# Patient Record
Sex: Male | Born: 1980
Health system: Southern US, Community
[De-identification: ages and names within clinical notes are randomized; demographics above are authoritative.]

## PROBLEM LIST (undated history)

## (undated) DIAGNOSIS — I219 Acute myocardial infarction, unspecified: Secondary | ICD-10-CM

## (undated) DIAGNOSIS — F1010 Alcohol abuse, uncomplicated: Secondary | ICD-10-CM

## (undated) DIAGNOSIS — I255 Ischemic cardiomyopathy: Secondary | ICD-10-CM

## (undated) DIAGNOSIS — Z720 Tobacco use: Secondary | ICD-10-CM

## (undated) DIAGNOSIS — I509 Heart failure, unspecified: Secondary | ICD-10-CM

## (undated) DIAGNOSIS — I5022 Chronic systolic (congestive) heart failure: Secondary | ICD-10-CM

## (undated) DIAGNOSIS — G4730 Sleep apnea, unspecified: Secondary | ICD-10-CM

## (undated) DIAGNOSIS — J45909 Unspecified asthma, uncomplicated: Secondary | ICD-10-CM

## (undated) DIAGNOSIS — I2510 Atherosclerotic heart disease of native coronary artery without angina pectoris: Secondary | ICD-10-CM

---

## 2007-02-09 ENCOUNTER — Emergency Department (HOSPITAL_COMMUNITY): Admission: EM | Admit: 2007-02-09 | Discharge: 2007-02-09 | Payer: Self-pay | Admitting: Emergency Medicine

## 2016-12-30 DIAGNOSIS — I509 Heart failure, unspecified: Secondary | ICD-10-CM

## 2016-12-30 HISTORY — DX: Heart failure, unspecified: I50.9

## 2017-01-17 ENCOUNTER — Ambulatory Visit (HOSPITAL_COMMUNITY)
Admission: EM | Admit: 2017-01-17 | Discharge: 2017-01-17 | Disposition: A | Discharge status: Home / Self Care | Payer: Self-pay | Attending: Family Medicine | Admitting: Family Medicine

## 2017-01-17 ENCOUNTER — Encounter (HOSPITAL_COMMUNITY): Payer: Self-pay

## 2017-01-17 DIAGNOSIS — J40 Bronchitis, not specified as acute or chronic: Secondary | ICD-10-CM

## 2017-01-17 DIAGNOSIS — R05 Cough: Secondary | ICD-10-CM

## 2017-01-17 DIAGNOSIS — R509 Fever, unspecified: Secondary | ICD-10-CM

## 2017-01-17 DIAGNOSIS — R059 Cough, unspecified: Secondary | ICD-10-CM

## 2017-01-17 MED ORDER — AZITHROMYCIN 250 MG PO TABS: 250.0000 mg | ORAL_TABLET | Freq: Every day | ORAL | 0 refills | Status: DC

## 2017-01-17 MED ORDER — BENZONATATE 100 MG PO CAPS: 200.0000 mg | ORAL_CAPSULE | Freq: Three times a day (TID) | ORAL | 0 refills | Status: DC | PRN

## 2017-01-17 MED ORDER — OSELTAMIVIR PHOSPHATE 75 MG PO CAPS: 75.0000 mg | ORAL_CAPSULE | Freq: Two times a day (BID) | ORAL | 0 refills | Status: DC

## 2017-01-17 NOTE — ED Triage Notes (Signed)
Cough for 3 days and said sometimes it can be productive. Chest tightness, Body feels weak, mild fever. Sharp intermittent pain in both ears. Took cold and flu medication today.

## 2017-01-17 NOTE — ED Provider Notes (Signed)
CSN: [id number]     Arrival date & time 01/17/17  1545 History   None    Chief Complaint  Patient presents with  . Cough   (Consider location/radiation/quality/duration/timing/severity/associated sxs/prior Treatment) Patient c/o cough and uri sx's for over 3 days.  He has developed a fever over last 24 hours. His room mate has the flu.  He is a smoker.   The history is provided by the patient.  Cough  Cough characteristics:  Productive Sputum characteristics:  Yellow Severity:  Moderate Onset quality:  Sudden Duration:  3 days Timing:  Intermittent Progression:  Worsening Chronicity:  New Smoker: yes   Context: upper respiratory infection and weather changes   Relieved by:  None tried Worsened by:  Nothing Associated symptoms: fever     History reviewed. No pertinent past medical history. History reviewed. No pertinent surgical history. No family history on file. Social History  Substance Use Topics  . Smoking status: Current Some Day Smoker    Packs/day: 0.00    Types: Cigarettes  . Smokeless tobacco: Never Used  . Alcohol use 6.0 oz/week    10 Standard drinks or equivalent per week    Review of Systems  Constitutional: Positive for fatigue and fever.  HENT: Positive for congestion.   Eyes: Negative.   Respiratory: Positive for cough.   Cardiovascular: Negative.   Gastrointestinal: Negative.   Endocrine: Negative.   Genitourinary: Negative.   Musculoskeletal: Negative.   Allergic/Immunologic: Negative.   Neurological: Negative.   Hematological: Negative.   Psychiatric/Behavioral: Negative.     Allergies  Patient has no known allergies.  Home Medications   Prior to Admission medications   Medication Sig Start Date End Date Taking? Authorizing Provider  azithromycin (ZITHROMAX) 250 MG tablet Take 1 tablet (250 mg total) by mouth daily. Take first 2 tablets together, then 1 every day until finished. 01/17/17   Deatra Canter, FNP  benzonatate (TESSALON)  100 MG capsule Take 2 capsules (200 mg total) by mouth 3 (three) times daily as needed for cough. 01/17/17   Deatra Canter, FNP  oseltamivir (TAMIFLU) 75 MG capsule Take 1 capsule (75 mg total) by mouth every 12 (twelve) hours. 01/17/17   Deatra Canter, FNP   Meds Ordered and Administered this Visit  Medications - No data to display  BP 154/90 (BP Location: Left Arm)   Pulse 74   Temp 98.1 F (36.7 C) (Oral)   Resp 20   SpO2 97%  No data found.   Physical Exam  Constitutional: He appears well-developed and well-nourished.  HENT:  Head: Normocephalic and atraumatic.  Right Ear: External ear normal.  Left Ear: External ear normal.  Mouth/Throat: Oropharynx is clear and moist.  Eyes: Conjunctivae and EOM are normal. Pupils are equal, round, and reactive to light.  Neck: Normal range of motion. Neck supple.  Cardiovascular: Normal rate, regular rhythm and normal heart sounds.   Pulmonary/Chest: Effort normal and breath sounds normal.  Abdominal: Soft. Bowel sounds are normal.  Nursing note and vitals reviewed.   Urgent Care Course     Procedures (including critical care time)  Labs Review Labs Reviewed - No data to display  Imaging Review No results found.   Visual Acuity Review  Right Eye Distance:   Left Eye Distance:   Bilateral Distance:    Right Eye Near:   Left Eye Near:    Bilateral Near:         MDM   1. Bronchitis 2. Influenza 3.  Cough  Zpak Tamiflu Tessalon Perles  Push po fluids, rest, tylenol and motrin otc prn as directed for fever, arthralgias, and myalgias.  Follow up prn if sx's continue or persist.    Deatra Canter, FNP 01/17/17 1810

## 2018-01-18 ENCOUNTER — Encounter (HOSPITAL_COMMUNITY): Payer: Self-pay | Admitting: Emergency Medicine

## 2018-01-18 ENCOUNTER — Inpatient Hospital Stay (HOSPITAL_COMMUNITY)
Admission: EM | Admit: 2018-01-18 | Discharge: 2018-01-22 | DRG: 246 | Disposition: A | Discharge status: Home / Self Care | Payer: Self-pay | Attending: Internal Medicine | Admitting: Internal Medicine

## 2018-01-18 ENCOUNTER — Emergency Department (HOSPITAL_COMMUNITY): Payer: Self-pay

## 2018-01-18 ENCOUNTER — Encounter (HOSPITAL_COMMUNITY)
Admission: EM | Disposition: A | Discharge status: Home / Self Care | Payer: Self-pay | Source: Home / Self Care | Attending: Internal Medicine

## 2018-01-18 ENCOUNTER — Inpatient Hospital Stay (HOSPITAL_COMMUNITY): Payer: Self-pay

## 2018-01-18 ENCOUNTER — Other Ambulatory Visit: Payer: Self-pay

## 2018-01-18 DIAGNOSIS — I5021 Acute systolic (congestive) heart failure: Secondary | ICD-10-CM

## 2018-01-18 DIAGNOSIS — I2129 ST elevation (STEMI) myocardial infarction involving other sites: Secondary | ICD-10-CM

## 2018-01-18 DIAGNOSIS — Z955 Presence of coronary angioplasty implant and graft: Secondary | ICD-10-CM

## 2018-01-18 DIAGNOSIS — I255 Ischemic cardiomyopathy: Secondary | ICD-10-CM

## 2018-01-18 DIAGNOSIS — F17210 Nicotine dependence, cigarettes, uncomplicated: Secondary | ICD-10-CM | POA: Diagnosis present

## 2018-01-18 DIAGNOSIS — E669 Obesity, unspecified: Secondary | ICD-10-CM | POA: Diagnosis present

## 2018-01-18 DIAGNOSIS — I2109 ST elevation (STEMI) myocardial infarction involving other coronary artery of anterior wall: Principal | ICD-10-CM | POA: Diagnosis present

## 2018-01-18 DIAGNOSIS — J189 Pneumonia, unspecified organism: Secondary | ICD-10-CM

## 2018-01-18 DIAGNOSIS — R9431 Abnormal electrocardiogram [ECG] [EKG]: Secondary | ICD-10-CM

## 2018-01-18 DIAGNOSIS — J45909 Unspecified asthma, uncomplicated: Secondary | ICD-10-CM | POA: Diagnosis present

## 2018-01-18 DIAGNOSIS — F1010 Alcohol abuse, uncomplicated: Secondary | ICD-10-CM | POA: Diagnosis present

## 2018-01-18 DIAGNOSIS — I509 Heart failure, unspecified: Secondary | ICD-10-CM

## 2018-01-18 DIAGNOSIS — I249 Acute ischemic heart disease, unspecified: Secondary | ICD-10-CM | POA: Diagnosis present

## 2018-01-18 DIAGNOSIS — I2510 Atherosclerotic heart disease of native coronary artery without angina pectoris: Secondary | ICD-10-CM | POA: Diagnosis present

## 2018-01-18 DIAGNOSIS — E876 Hypokalemia: Secondary | ICD-10-CM

## 2018-01-18 DIAGNOSIS — Z6836 Body mass index (BMI) 36.0-36.9, adult: Secondary | ICD-10-CM

## 2018-01-18 DIAGNOSIS — I219 Acute myocardial infarction, unspecified: Secondary | ICD-10-CM

## 2018-01-18 HISTORY — PX: LEFT HEART CATH AND CORONARY ANGIOGRAPHY: CATH118249

## 2018-01-18 HISTORY — DX: Unspecified asthma, uncomplicated: J45.909

## 2018-01-18 LAB — D-DIMER, QUANTITATIVE: D-Dimer, Quant: 2.48 ug/mL-FEU — ABNORMAL HIGH (ref 0.00–0.50)

## 2018-01-18 LAB — BRAIN NATRIURETIC PEPTIDE: B NATRIURETIC PEPTIDE 5: 909.5 pg/mL — AB (ref 0.0–100.0)

## 2018-01-18 LAB — ECHOCARDIOGRAM COMPLETE
CHL CUP STROKE VOLUME: 51 mL
E decel time: 127 msec
E/e' ratio: 9.55
FS: 17 % — AB (ref 28–44)
Height: 75.5 in
IVS/LV PW RATIO, ED: 1
LA ID, A-P, ES: 44 mm
LA diam index: 1.7 cm/m2
LAVOL: 85.4 mL
LAVOLA4C: 93.8 mL
LAVOLIN: 33 mL/m2
LDCA: 4.15 cm2
LEFT ATRIUM END SYS DIAM: 44 mm
LV E/e' medial: 9.55
LV E/e'average: 9.55
LV dias vol: 157 mL — AB (ref 62–150)
LV sys vol index: 41 mL/m2
LVDIAVOLIN: 61 mL/m2
LVELAT: 10.1 cm/s
LVOT SV: 68 mL
LVOT VTI: 16.5 cm
LVOT diameter: 23 mm
LVOTPV: 105 cm/s
LVSYSVOL: 106 mL — AB (ref 21–61)
MV Dec: 127
MV pk A vel: 42.7 m/s
MV pk E vel: 96.5 m/s
MVPG: 4 mmHg
PW: 12 mm — AB (ref 0.6–1.1)
RV LATERAL S' VELOCITY: 13.8 cm/s
RV TAPSE: 21.4 mm
Simpson's disk: 33
TDI e' lateral: 10.1
TDI e' medial: 5.66
Weight: 4676.8 oz

## 2018-01-18 LAB — CBC WITH DIFFERENTIAL/PLATELET
BASOS PCT: 0 %
Basophils Absolute: 0 10*3/uL (ref 0.0–0.1)
EOS PCT: 0 %
Eosinophils Absolute: 0 10*3/uL (ref 0.0–0.7)
HEMATOCRIT: 40.7 % (ref 39.0–52.0)
HEMOGLOBIN: 14.3 g/dL (ref 13.0–17.0)
LYMPHS PCT: 21 %
Lymphs Abs: 2.8 10*3/uL (ref 0.7–4.0)
MCH: 31.8 pg (ref 26.0–34.0)
MCHC: 35.1 g/dL (ref 30.0–36.0)
MCV: 90.4 fL (ref 78.0–100.0)
MONOS PCT: 11 %
Monocytes Absolute: 1.5 10*3/uL — ABNORMAL HIGH (ref 0.1–1.0)
NEUTROS PCT: 68 %
Neutro Abs: 9 10*3/uL — ABNORMAL HIGH (ref 1.7–7.7)
Platelets: 363 10*3/uL (ref 150–400)
RBC: 4.5 MIL/uL (ref 4.22–5.81)
RDW: 12.7 % (ref 11.5–15.5)
WBC: 13.3 10*3/uL — ABNORMAL HIGH (ref 4.0–10.5)

## 2018-01-18 LAB — BASIC METABOLIC PANEL
Anion gap: 12 (ref 5–15)
BUN: 10 mg/dL (ref 6–20)
CO2: 19 mmol/L — AB (ref 22–32)
CREATININE: 1.02 mg/dL (ref 0.61–1.24)
Calcium: 8.2 mg/dL — ABNORMAL LOW (ref 8.9–10.3)
Chloride: 100 mmol/L — ABNORMAL LOW (ref 101–111)
GFR calc Af Amer: >60 mL/min (ref >60)
GFR calc non Af Amer: >60 mL/min (ref >60)
GLUCOSE: 161 mg/dL — AB (ref 65–99)
Potassium: 3.1 mmol/L — ABNORMAL LOW (ref 3.5–5.1)
SODIUM: 131 mmol/L — AB (ref 135–145)

## 2018-01-18 LAB — PROTIME-INR
INR: 1.21
PROTHROMBIN TIME: 15.2 s (ref 11.4–15.2)

## 2018-01-18 LAB — TROPONIN I
Troponin I: 7.59 ng/mL (ref <0.03)
Troponin I: 7.49 ng/mL (ref <0.03)
Troponin I: 6.28 ng/mL (ref <0.03)
Troponin I: 5.52 ng/mL (ref <0.03)

## 2018-01-18 LAB — I-STAT TROPONIN, ED: TROPONIN I, POC: 7.84 ng/mL — AB (ref 0.00–0.08)

## 2018-01-18 LAB — HEPARIN LEVEL (UNFRACTIONATED): Heparin Unfractionated: <0.1 IU/mL — ABNORMAL LOW (ref 0.30–0.70)

## 2018-01-18 LAB — HEMOGLOBIN A1C
Hgb A1c MFr Bld: 5.5 % (ref 4.8–5.6)
Mean Plasma Glucose: 111.15 mg/dL

## 2018-01-18 LAB — LIPID PANEL
Cholesterol: 178 mg/dL (ref 0–200)
HDL: 20 mg/dL — ABNORMAL LOW (ref >40)
LDL Cholesterol: 140 mg/dL — ABNORMAL HIGH (ref 0–99)
Total CHOL/HDL Ratio: 8.9 RATIO
Triglycerides: 89 mg/dL (ref <150)
VLDL: 18 mg/dL (ref 0–40)

## 2018-01-18 LAB — APTT: aPTT: 112 seconds — ABNORMAL HIGH (ref 24–36)

## 2018-01-18 LAB — MAGNESIUM: MAGNESIUM: 2.2 mg/dL (ref 1.7–2.4)

## 2018-01-18 LAB — MRSA PCR SCREENING: MRSA by PCR: NEGATIVE

## 2018-01-18 LAB — HIV ANTIBODY (ROUTINE TESTING W REFLEX): HIV Screen 4th Generation wRfx: NONREACTIVE

## 2018-01-18 SURGERY — LEFT HEART CATH AND CORONARY ANGIOGRAPHY
Anesthesia: LOCAL

## 2018-01-18 MED ORDER — HEPARIN SODIUM (PORCINE) 1000 UNIT/ML IJ SOLN
INTRAMUSCULAR | Status: DC | PRN
  Administered 2018-01-18: 5000 [IU] via INTRAVENOUS

## 2018-01-18 MED ORDER — FUROSEMIDE 10 MG/ML IJ SOLN
20.0000 mg | Freq: Once | INTRAMUSCULAR | Status: AC
  Administered 2018-01-18: 20 mg via INTRAVENOUS
  Filled 2018-01-18: qty 2

## 2018-01-18 MED ORDER — LOSARTAN POTASSIUM 25 MG PO TABS
25.0000 mg | ORAL_TABLET | Freq: Every day | ORAL | Status: DC
  Administered 2018-01-18 – 2018-01-21 (×4): 25 mg via ORAL
  Filled 2018-01-18 (×4): qty 1

## 2018-01-18 MED ORDER — VERAPAMIL HCL 2.5 MG/ML IV SOLN
INTRAVENOUS | Status: AC
  Filled 2018-01-18: qty 2

## 2018-01-18 MED ORDER — SODIUM CHLORIDE 0.9 % IV SOLN: INTRAVENOUS | Status: DC

## 2018-01-18 MED ORDER — IOPAMIDOL (ISOVUE-370) INJECTION 76%
INTRAVENOUS | Status: AC
  Administered 2018-01-18: 100 mL
  Filled 2018-01-18: qty 100

## 2018-01-18 MED ORDER — HEPARIN (PORCINE) IN NACL 2-0.9 UNIT/ML-% IJ SOLN
INTRAMUSCULAR | Status: AC | PRN
  Administered 2018-01-18: 1000 mL

## 2018-01-18 MED ORDER — CARVEDILOL 3.125 MG PO TABS
3.1250 mg | ORAL_TABLET | Freq: Two times a day (BID) | ORAL | Status: DC
  Administered 2018-01-18 – 2018-01-22 (×7): 3.125 mg via ORAL
  Filled 2018-01-18 (×8): qty 1

## 2018-01-18 MED ORDER — ASPIRIN 81 MG PO CHEW
CHEWABLE_TABLET | ORAL | Status: AC
  Filled 2018-01-18: qty 1

## 2018-01-18 MED ORDER — FENTANYL CITRATE (PF) 100 MCG/2ML IJ SOLN
50.0000 ug | Freq: Once | INTRAMUSCULAR | Status: AC
  Administered 2018-01-18: 50 ug via INTRAVENOUS
  Filled 2018-01-18: qty 2

## 2018-01-18 MED ORDER — HEPARIN BOLUS VIA INFUSION
4000.0000 [IU] | Freq: Once | INTRAVENOUS | Status: DC
  Filled 2018-01-18: qty 4000

## 2018-01-18 MED ORDER — HEPARIN SODIUM (PORCINE) 1000 UNIT/ML IJ SOLN
INTRAMUSCULAR | Status: AC
  Filled 2018-01-18: qty 1

## 2018-01-18 MED ORDER — ASPIRIN 81 MG PO CHEW
324.0000 mg | CHEWABLE_TABLET | Freq: Once | ORAL | Status: AC
  Administered 2018-01-18: 324 mg via ORAL
  Filled 2018-01-18: qty 4

## 2018-01-18 MED ORDER — AZITHROMYCIN 250 MG PO TABS: 250.0000 mg | ORAL_TABLET | Freq: Every day | ORAL | Status: DC

## 2018-01-18 MED ORDER — FUROSEMIDE 10 MG/ML IJ SOLN
INTRAMUSCULAR | Status: DC | PRN
  Administered 2018-01-18: 40 mg via INTRAVENOUS

## 2018-01-18 MED ORDER — ASPIRIN 81 MG PO CHEW
CHEWABLE_TABLET | ORAL | Status: DC | PRN
  Administered 2018-01-18: 324 mg via ORAL

## 2018-01-18 MED ORDER — SODIUM CHLORIDE 0.9% FLUSH: 3.0000 mL | Freq: Two times a day (BID) | INTRAVENOUS | Status: DC

## 2018-01-18 MED ORDER — HEPARIN SODIUM (PORCINE) 5000 UNIT/ML IJ SOLN
4000.0000 [IU] | Freq: Once | INTRAMUSCULAR | Status: AC
  Administered 2018-01-18: 4000 [IU] via INTRAVENOUS

## 2018-01-18 MED ORDER — POTASSIUM CHLORIDE CRYS ER 20 MEQ PO TBCR
40.0000 meq | EXTENDED_RELEASE_TABLET | Freq: Every day | ORAL | Status: DC
Start: 2018-01-18 — End: 2018-01-22
  Administered 2018-01-18 – 2018-01-22 (×5): 40 meq via ORAL
  Filled 2018-01-18 (×5): qty 2

## 2018-01-18 MED ORDER — MORPHINE SULFATE (PF) 10 MG/ML IV SOLN: 2.0000 mg | INTRAVENOUS | Status: DC | PRN

## 2018-01-18 MED ORDER — METOPROLOL TARTRATE 12.5 MG HALF TABLET
12.5000 mg | ORAL_TABLET | Freq: Two times a day (BID) | ORAL | Status: DC
  Administered 2018-01-18: 12.5 mg via ORAL
  Filled 2018-01-18: qty 1

## 2018-01-18 MED ORDER — HEPARIN SODIUM (PORCINE) 5000 UNIT/ML IJ SOLN
INTRAMUSCULAR | Status: AC
  Filled 2018-01-18: qty 1

## 2018-01-18 MED ORDER — IPRATROPIUM-ALBUTEROL 0.5-2.5 (3) MG/3ML IN SOLN
3.0000 mL | Freq: Four times a day (QID) | RESPIRATORY_TRACT | Status: DC | PRN
  Administered 2018-01-18: 3 mL via RESPIRATORY_TRACT
  Filled 2018-01-18 (×2): qty 3

## 2018-01-18 MED ORDER — SODIUM CHLORIDE 0.9% FLUSH
3.0000 mL | Freq: Two times a day (BID) | INTRAVENOUS | Status: DC
  Administered 2018-01-18 – 2018-01-22 (×6): 3 mL via INTRAVENOUS

## 2018-01-18 MED ORDER — LIDOCAINE HCL (PF) 1 % IJ SOLN
INTRAMUSCULAR | Status: DC | PRN
  Administered 2018-01-18: 2 mL

## 2018-01-18 MED ORDER — SODIUM CHLORIDE 0.9 % IV SOLN: INTRAVENOUS | Status: AC

## 2018-01-18 MED ORDER — FUROSEMIDE 10 MG/ML IJ SOLN
INTRAMUSCULAR | Status: AC
  Filled 2018-01-18: qty 4

## 2018-01-18 MED ORDER — HEPARIN (PORCINE) IN NACL 100-0.45 UNIT/ML-% IJ SOLN
2000.0000 [IU]/h | INTRAMUSCULAR | Status: DC
  Administered 2018-01-18: 1500 [IU]/h via INTRAVENOUS
  Filled 2018-01-18 (×3): qty 250

## 2018-01-18 MED ORDER — MORPHINE SULFATE (PF) 4 MG/ML IV SOLN
2.0000 mg | INTRAVENOUS | Status: DC | PRN
  Administered 2018-01-18 – 2018-01-21 (×7): 2 mg via INTRAVENOUS
  Filled 2018-01-18 (×7): qty 1

## 2018-01-18 MED ORDER — SODIUM CHLORIDE 0.9% FLUSH: 3.0000 mL | INTRAVENOUS | Status: DC | PRN

## 2018-01-18 MED ORDER — LIDOCAINE HCL 1 % IJ SOLN
INTRAMUSCULAR | Status: AC
  Filled 2018-01-18: qty 20

## 2018-01-18 MED ORDER — IOPAMIDOL (ISOVUE-370) INJECTION 76%
INTRAVENOUS | Status: DC | PRN
  Administered 2018-01-18: 55 mL via INTRA_ARTERIAL

## 2018-01-18 MED ORDER — ATORVASTATIN CALCIUM 80 MG PO TABS
80.0000 mg | ORAL_TABLET | Freq: Every day | ORAL | Status: DC
  Administered 2018-01-18 – 2018-01-21 (×4): 80 mg via ORAL
  Filled 2018-01-18 (×4): qty 1

## 2018-01-18 MED ORDER — ACETAMINOPHEN 325 MG PO TABS
650.0000 mg | ORAL_TABLET | ORAL | Status: DC | PRN
  Administered 2018-01-19 – 2018-01-22 (×5): 650 mg via ORAL
  Filled 2018-01-18 (×5): qty 2

## 2018-01-18 MED ORDER — ASPIRIN EC 81 MG PO TBEC
81.0000 mg | DELAYED_RELEASE_TABLET | Freq: Every day | ORAL | Status: DC
  Administered 2018-01-19 – 2018-01-22 (×4): 81 mg via ORAL
  Filled 2018-01-18 (×4): qty 1

## 2018-01-18 MED ORDER — POTASSIUM CHLORIDE CRYS ER 20 MEQ PO TBCR
40.0000 meq | EXTENDED_RELEASE_TABLET | ORAL | Status: AC
  Administered 2018-01-18 (×2): 40 meq via ORAL
  Filled 2018-01-18 (×2): qty 2

## 2018-01-18 MED ORDER — NITROGLYCERIN 0.4 MG SL SUBL
0.4000 mg | SUBLINGUAL_TABLET | SUBLINGUAL | Status: DC | PRN
  Administered 2018-01-18: 0.4 mg via SUBLINGUAL
  Filled 2018-01-18: qty 1

## 2018-01-18 MED ORDER — SODIUM CHLORIDE 0.9 % IV SOLN: 250.0000 mL | INTRAVENOUS | Status: DC | PRN

## 2018-01-18 MED ORDER — ONDANSETRON HCL 4 MG/2ML IJ SOLN: 4.0000 mg | Freq: Four times a day (QID) | INTRAMUSCULAR | Status: DC | PRN

## 2018-01-18 MED ORDER — HEPARIN (PORCINE) IN NACL 2-0.9 UNIT/ML-% IJ SOLN
INTRAMUSCULAR | Status: AC
  Filled 2018-01-18: qty 1000

## 2018-01-18 MED ORDER — IOPAMIDOL (ISOVUE-370) INJECTION 76%
INTRAVENOUS | Status: AC
  Filled 2018-01-18: qty 125

## 2018-01-18 MED ORDER — VERAPAMIL HCL 2.5 MG/ML IV SOLN
INTRAVENOUS | Status: DC | PRN
  Administered 2018-01-18: 5 mL via INTRA_ARTERIAL

## 2018-01-18 MED ORDER — NITROGLYCERIN 1 MG/10 ML FOR IR/CATH LAB
INTRA_ARTERIAL | Status: AC
  Filled 2018-01-18: qty 10

## 2018-01-18 MED ORDER — FUROSEMIDE 10 MG/ML IJ SOLN
40.0000 mg | Freq: Two times a day (BID) | INTRAMUSCULAR | Status: DC
  Administered 2018-01-18: 40 mg via INTRAVENOUS
  Filled 2018-01-18: qty 4

## 2018-01-18 MED ORDER — SPIRONOLACTONE 12.5 MG HALF TABLET
12.5000 mg | ORAL_TABLET | Freq: Every day | ORAL | Status: DC
  Administered 2018-01-18: 12.5 mg via ORAL
  Filled 2018-01-18 (×3): qty 1

## 2018-01-18 MED ORDER — ACETAMINOPHEN 325 MG PO TABS: 650.0000 mg | ORAL_TABLET | ORAL | Status: DC | PRN

## 2018-01-18 MED ORDER — ASPIRIN 81 MG PO CHEW: 81.0000 mg | CHEWABLE_TABLET | Freq: Every day | ORAL | Status: DC

## 2018-01-18 MED ORDER — BENZONATATE 100 MG PO CAPS: 200.0000 mg | ORAL_CAPSULE | Freq: Three times a day (TID) | ORAL | Status: DC | PRN

## 2018-01-18 MED ORDER — SODIUM CHLORIDE 0.9 % IV BOLUS (SEPSIS)
1000.0000 mL | Freq: Once | INTRAVENOUS | Status: AC
  Administered 2018-01-18: 1000 mL via INTRAVENOUS

## 2018-01-18 MED ORDER — CLOPIDOGREL BISULFATE 75 MG PO TABS
75.0000 mg | ORAL_TABLET | Freq: Every day | ORAL | Status: DC
  Administered 2018-01-19 – 2018-01-22 (×4): 75 mg via ORAL
  Filled 2018-01-18 (×4): qty 1

## 2018-01-18 MED ORDER — ASPIRIN 81 MG PO CHEW
CHEWABLE_TABLET | ORAL | Status: AC
  Filled 2018-01-18: qty 4

## 2018-01-18 SURGICAL SUPPLY — 12 items
CATH INFINITI 5FR ANG PIGTAIL (CATHETERS) ×2 IMPLANT
CATH OPTITORQUE TIG 4.0 5F (CATHETERS) ×2 IMPLANT
DEVICE RAD COMP TR BAND LRG (VASCULAR PRODUCTS) ×2 IMPLANT
GLIDESHEATH SLEND A-KIT 6F 22G (SHEATH) ×2 IMPLANT
GUIDEWIRE INQWIRE 1.5J.035X260 (WIRE) ×1 IMPLANT
HOVERMATT SINGLE USE (MISCELLANEOUS) ×2 IMPLANT
INQWIRE 1.5J .035X260CM (WIRE) ×2
KIT HEART LEFT (KITS) ×2 IMPLANT
PACK CARDIAC CATHETERIZATION (CUSTOM PROCEDURE TRAY) ×2 IMPLANT
TRANSDUCER W/STOPCOCK (MISCELLANEOUS) ×2 IMPLANT
TUBING CIL FLEX 10 FLL-RA (TUBING) ×2 IMPLANT
WIRE HITORQ VERSACORE ST 145CM (WIRE) ×2 IMPLANT

## 2018-01-18 NOTE — Progress Notes (Signed)
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 Plan for [facility] tomorrow. Cath orders placed - keep NPO p MN.  Chrystie Nose, MD, [facility], FACP  [place]  [facility]  Medical Director of the Advanced Lipid [facility] &  Cardiovascular Risk Reduction Clinic Diplomate of the American Board of Clinical Lipidology Attending Cardiologist  Direct Dial: [phone]  Fax: [phone]  Website:  [email or web address]

## 2018-01-18 NOTE — Progress Notes (Signed)
Patient transferred to cath lab

## 2018-01-18 NOTE — Progress Notes (Signed)
  Echocardiogram 2D Echocardiogram has been performed.  Delcie Roch 01/18/2018, 9:56 AM

## 2018-01-18 NOTE — H&P (View-Only) (Signed)
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 Plan for [facility] tomorrow. Cath orders placed - keep NPO p MN.  Kenneth C. Hilty, MD, FACC, FACP    [facility]  Medical Director of the [facility] Lipid [facility]  Cardiovascular Risk Reduction Clinic Diplomate of the American Board of Clinical Lipidology Attending Cardiologist  Direct Dial: [phone]  Fax: [phone]  Website:  [email or web address]  

## 2018-01-18 NOTE — ED Notes (Signed)
ED Provider at bedside. 

## 2018-01-18 NOTE — Progress Notes (Signed)
ANTICOAGULATION CONSULT NOTE - Initial Consult  Pharmacy Consult for Heparin  Indication: chest pain/ACS  No Known Allergies  Patient Measurements: Height: 6' 3.5" (191.8 cm) Weight: 292 lb 4.8 oz (132.6 kg) IBW/kg (Calculated) : 85.65  Vital Signs: Temp: 98.5 F (36.9 C) (01/20 0013) Temp Source: Oral (01/20 0013) BP: 105/69 (01/20 0230) Pulse Rate: 102 (01/20 0230)  Labs: Recent Labs    01/18/18 0017  HGB 14.3  HCT 40.7  PLT 363  CREATININE 1.02    Estimated Creatinine Clearance: 148 mL/min (by C-G formula based on SCr of 1.02 mg/dL).   Medical History: Past Medical History:  Diagnosis Date  . Asthma     Assessment: Heparin for chest pain, has been sick for the past week or so, pain worse with inspiration, troponin is elevated at 7.84, plan for cath later today, starting heparin, CBC good, PTA meds reviewed.   Goal of Therapy:  Heparin level 0.3-0.7 units/ml Monitor platelets by anticoagulation protocol: Yes   Plan:  -Heparin 4000 units already given in ED -Start heparin at 1500 units/hr -1100 HL -Daily CBC/HL -Monitor for bleeding  Abran Duke 01/18/2018,3:11 AM

## 2018-01-18 NOTE — ED Notes (Signed)
Translucent Pads placed on pt. Monitoring on Zoll at this time.

## 2018-01-18 NOTE — Progress Notes (Addendum)
Upon assessment, pt states he has "5/10 left chest pain that hasn't really changed since I got here." VSS. Pt asymptomatic otherwise. Repeat EKG done, Cards paged with EKG changes since previous ekg earlier this am. Hilty, MD to bedside to assess. No further orders at this time. Will continue to monitor closely.  Delories Heinz, RN

## 2018-01-18 NOTE — Consult Note (Signed)
Advanced Heart Failure Team Consult Note   Referring Physician: Hilty   Reason for Consultation: Severe ischemic CM  HPI:    Sergio Hicks is seen today for evaluation of severe ischemic CM at the request of Dr. Rennis Golden.   He was in [place] until December when he developed a flu-like illness that was treated with oseltamivir. His symptoms improved and then worsened about a week ago, when he developed dyspnea and cough productive of scant bloody sputum. He went to an urgent care and was diagnosed with pneumonia, for which he was prescribed azithromycin. He felt better with treatment for a few days. Yesterday developed worsening SOB and CP sp came to ED.   In the ED, the patient was afebrile with HR 90s-100s, SBP 80s-120s. ED evaluation was notable for troponin elevation to 7.8, positive D-Dimer and mild leukocytosis. Due to concern for PE, he underwent CTA that showed no embolism or gross airspace disease.; notably this study did show LAD calcification. ECG was ordered and showed sinus tachycardia with poor R wave progression/possible anterior Q waves some degree of ST elevation in 1, AVL. Code STEMI was initially activated from the ED, which was subsequently cancelled on further discussion with the patient and interventional cardiologist on call.   Echo today shwed EF ~ 30% with WMA c/w anterior infarct. Underwent cath with Dr. Allyson Sabal which showed prox LAD occlusion with R to L collaterals LVEDP 31. Treated medically.   Denies CP. Mildly SOB.   Review of Systems: [y] = yes, [ ]  = no   General: Weight gain [ ] ; Weight loss [ ] ; Anorexia [ ] ; Fatigue [ y]; Fever [ ] ; Chills [ ] ; Weakness [ ]   Cardiac: Chest pain/pressure Cove.Etienne ]; Resting SOB Cove.Etienne ]; Exertional SOB [ y]; Orthopnea [ y]; Pedal Edema Cove.Etienne ]; Palpitations [ ] ; Syncope [ ] ; Presyncope [ ] ; Paroxysmal nocturnal dyspnea[ ]   Pulmonary: Cough [ y]; Wheezing[ ] ; Hemoptysis[ ] ; Sputum Cove.Etienne ]; Snoring [ ]   GI: Vomiting[ ] ; Dysphagia[ ] ; Melena[ ] ;  Hematochezia [ ] ; Heartburn[ ] ; Abdominal pain [ ] ; Constipation [ ] ; Diarrhea [ ] ; BRBPR [ ]   GU: Hematuria[ ] ; Dysuria [ ] ; Nocturia[ ]   Vascular: Pain in legs with walking [ ] ; Pain in feet with lying flat [ ] ; Non-healing sores [ ] ; Stroke [ ] ; TIA [ ] ; Slurred speech [ ] ;  Neuro: Headaches[ ] ; Vertigo[ ] ; Seizures[ ] ; Paresthesias[ ] ;Blurred vision [ ] ; Diplopia [ ] ; Vision changes [ ]   Ortho/Skin: Arthritis [ ] ; Joint pain [ ] ; Muscle pain [ ] ; Joint swelling [ ] ; Back Pain [ ] ; Rash [ ]   Psych: Depression[ ] ; Anxiety[ ]   Heme: Bleeding problems [ ] ; Clotting disorders [ ] ; Anemia [ ]   Endocrine: Diabetes [ ] ; Thyroid dysfunction[ ]   Home Medications Prior to Admission medications   Medication Sig Start Date End Date Taking? Authorizing Provider  albuterol (PROVENTIL HFA;VENTOLIN HFA) 108 (90 Base) MCG/ACT inhaler Inhale 1-2 puffs into the lungs every 6 (six) hours as needed for wheezing or shortness of breath.   Yes [provider]  Amoxicillin-Pot Clavulanate (AUGMENTIN PO) Take 1 tablet by mouth 2 (two) times daily.   Yes [provider]    Past Medical History: Past Medical History:  Diagnosis Date  . Asthma     Past Surgical History: History reviewed. No pertinent surgical history.  Family History: He is adopted so not available  Social History: Social History   Socioeconomic History  . Marital  status: Single    Spouse name: None  . Number of children: None  . Years of education: None  . Highest education level: None  Social Needs  . Financial resource strain: None  . Food insecurity - worry: None  . Food insecurity - inability: None  . Transportation needs - medical: None  . Transportation needs - non-medical: None  Occupational History  . Occupation: chef  Tobacco Use  . Smoking status: Current Some Day Smoker    Packs/day: 0.00    Types: Cigarettes  . Smokeless tobacco: Never Used  Substance and Sexual Activity  . Alcohol use: Yes     Alcohol/week: 6.0 oz    Types: 10 Standard drinks or equivalent per week  . Drug use: None  . Sexual activity: None  Other Topics Concern  . None  Social History Narrative  . None    Allergies:  No Known Allergies  Objective:    Vital Signs:   Temp:  [98.5 F (36.9 C)-98.8 F (37.1 C)] 98.8 F (37.1 C) (01/20 0308) Pulse Rate:  [0-109] 93 (01/20 1340) Resp:  [0-35] 25 (01/20 1340) BP: (87-120)/(47-85) 109/73 (01/20 1335) SpO2:  [0 %-100 %] 96 % (01/20 1340) Weight:  [131.5 kg (290 lb)-132.6 kg (292 lb 4.8 oz)] 132.6 kg (292 lb 4.8 oz) (01/20 0308) Last BM Date: 01/17/18  Weight change: Filed Weights   01/18/18 0016 01/18/18 0308  Weight: 131.5 kg (290 lb) 132.6 kg (292 lb 4.8 oz)    Intake/Output:   Intake/Output Summary (Last 24 hours) at 01/18/2018 1456 Last data filed at 01/18/2018 1400 Gross per 24 hour  Intake 1047.5 ml  Output 3425 ml  Net -2377.5 ml      Physical Exam    General:  Well appearing. No resp difficulty HEENT: normal Neck: supple. JVP . Carotids 2+ bilat; no bruits. No lymphadenopathy or thyromegaly appreciated. Cor: PMI nondisplaced. Tachy regular . No rubs, gallops or murmurs. Lungs: clear Abdomen: obese soft, nontender, nondistended. No hepatosplenomegaly. No bruits or masses. Good bowel sounds. Extremities: no cyanosis, clubbing, rash, trace edema Neuro: alert & orientedx3, cranial nerves grossly intact. moves all 4 extremities w/o difficulty. Affect pleasant   Telemetry   Sinus/sinus tach 90-100 Personally reviewed   EKG    Sinus with anterolateral Qs  Personally reviewed   Labs   Basic Metabolic Panel: Recent Labs  Lab 01/18/18 0017 01/18/18 0457  NA 131*  --   K 3.1*  --   CL 100*  --   CO2 19*  --   GLUCOSE 161*  --   BUN 10  --   CREATININE 1.02  --   CALCIUM 8.2*  --   MG  --  2.2    Liver Function Tests: No results for input(s): AST, ALT, ALKPHOS, BILITOT, PROT, ALBUMIN in the last 168 hours. No results  for input(s): LIPASE, AMYLASE in the last 168 hours. No results for input(s): AMMONIA in the last 168 hours.  CBC: Recent Labs  Lab 01/18/18 0017  WBC 13.3*  NEUTROABS 9.0*  HGB 14.3  HCT 40.7  MCV 90.4  PLT 363    Cardiac Enzymes: Recent Labs  Lab 01/18/18 0240 01/18/18 1017  TROPONINI 7.59* 7.49*    BNP: BNP (last 3 results) Recent Labs    01/18/18 0457  BNP 909.5*    ProBNP (last 3 results) No results for input(s): PROBNP in the last 8760 hours.   CBG: No results for input(s): GLUCAP in the last 168 hours.  Coagulation Studies: Recent Labs    01/18/18 0240  LABPROT 15.2  INR 1.21     Imaging   Dg Chest 2 View  Result Date: 01/18/2018 CLINICAL DATA:  37 year old male with chest pain and shortness of breath. EXAM: CHEST  2 VIEW COMPARISON:  None. FINDINGS: There is no focal consolidation, pleural effusion, or pneumothorax. Mild interstitial and peribronchial coarsening. Clinical correlation is recommended to evaluate for reactive small airway disease or viral infection. The cardiac silhouette is within normal limits. No acute osseous pathology. IMPRESSION: No focal consolidation Electronically Signed   By: Elgie Collard M.D.   On: 01/18/2018 00:35   Ct Angio Chest Pe W And/or Wo Contrast  Result Date: 01/18/2018 CLINICAL DATA:  Evaluate for pulmonary embolus. Shortness of breath with chest pain. EXAM: CT ANGIOGRAPHY CHEST WITH CONTRAST TECHNIQUE: Multidetector CT imaging of the chest was performed using the standard protocol during bolus administration of intravenous contrast. Multiplanar CT image reconstructions and MIPs were obtained to evaluate the vascular anatomy. CONTRAST:  ISOVUE-370 IOPAMIDOL (ISOVUE-370) INJECTION 76% COMPARISON:  None. FINDINGS: Cardiovascular: There is moderate cardiac enlargement. No pericardial effusion. Calcifications identified within the LAD coronary artery. The main pulmonary artery is patent. There is no saddle  embolus identified. Respiratory motion artifact is identified which diminishes lobar and segmental pulmonary artery detail. The pulmonary arterial opacification is also slightly suboptimal. Within this limitation no lobar or segmental pulmonary artery filling defects identified to suggest a clinically significant acute pulmonary embolus. Mediastinum/Nodes: The trachea appears patent and is midline. Normal appearance of the esophagus. No supraclavicular or axillary adenopathy. Right hilar node measures 1.7 cm, image 43 of series 5. No mediastinal adenopathy. Lungs/Pleura: There is bilateral and lower lobe predominant interlobular septal thickening. Diffuse ground-glass attenuation identified throughout both lungs. No lobar consolidation or atelectasis. Upper Abdomen: No acute abnormality. Musculoskeletal: No chest wall abnormality. No acute or significant osseous findings. Review of the MIP images confirms the above findings. IMPRESSION: 1. Diminished exam detail due to respiratory motion artifact and suboptimal pulmonary arterial opacification. Within this limitation there is no central obstructing pulmonary embolus and no lobar or segmental pulmonary emboli identified. 2. Cardiac enlargement.  Lad coronary artery calcifications noted. 3. Mild to moderate pulmonary edema. Electronically Signed   By: Signa Kell M.D.   On: 01/18/2018 02:08      Medications:     Current Medications: . [START ON 01/19/2018] aspirin EC  81 mg Oral Daily  . atorvastatin  80 mg Oral q1800  . [START ON 01/19/2018] clopidogrel  75 mg Oral Q breakfast  . metoprolol tartrate  12.5 mg Oral BID  . sodium chloride flush  3 mL Intravenous Q12H     Infusions: . sodium chloride 50 mL/hr at 01/18/18 1325  . sodium chloride         Patient Profile   37 y/o male admitted with OOH anterior MI and severe ICM. Found to have 100% LAD occlusion   Assessment/Plan   1. CAD with out of hospital anterior MI with late  presentation - cath 1/20 with total occlusion of LAD with R to L collaterals. Treated medcially. Pending viability study - ECHO EF 30%  - no further CP. Continue DAPT and statin.  - CR consult  2. Ischemic CM with acute systolic HF - Echo EF ~30%. LVEDP 31 on cath today - Start IV lasix - Switch lopressor to carvedilol. Start spiro and losartan -cMRI tomorrow to assess for viability in anterior wall (size permitting) -consider LifeVest  3. Hypokalemia - supp K  Length of Stay: 0  Arvilla Meres, MD  01/18/2018, 2:56 PM  Advanced Heart Failure Team Pager [phone] (M-F; 7a - 4p)  Please contact [facility] for night-coverage after hours (4p -7a ) and weekends on [email or web address]

## 2018-01-18 NOTE — ED Notes (Signed)
Activated CODE STEMI per Dr. Elesa Massed, RN notified of call

## 2018-01-18 NOTE — ED Triage Notes (Signed)
Patient reports mid/left lower chest pain with SOB onset last week with emesis and occasional dry cough , seen at an urgent care last Sunday diagnosed with pneumonia prescribed with oral antibiotic /steroid/antitussive with no improvement . Pain increases with deep inspirations .

## 2018-01-18 NOTE — ED Provider Notes (Signed)
TIME SEEN: 1:29 AM  CHIEF COMPLAINT: Chest pain and shortness of breath  HPI: Patient is a 37 year old male with history of asthma and tobacco use, obesity who was adopted and does not know his family history who presents to the emergency department with chest pain.  States pain started around 1:30 PM yesterday afternoon.  States that it is a sharp pain that is worse with deep inspiration in the left side of his chest.  Also has some diffuse anterior tightness that radiates into the left upper arm.  Has had some shortness of breath.  He reports he has been feeling sick on and off since December 7.  States he had a flulike illness in December.  States this did get better and then a week ago developed pneumonia.  States he was seen by urgent care and was diagnosed with put on Augmentin which he has not yet finished.  States he did have a low-grade fever of 100 and productive cough with yellow, green sputum sometimes mixed with blood.  States that hemoptysis has improved.  States that because he was feeling so bad he stayed mostly in bed for the past week.  States he started having new symptoms today and came to the emergency department.  No lower extremity swelling or pain.  No history of CAD, stress test or cardiac catheterization.  He does not have a cardiologist.  No known history of hypertension, diabetes or hyperlipidemia.  He is currently a smoker.  No known history of CHF.  ROS: See HPI Constitutional: no fever  Eyes: no drainage  ENT: no runny nose   Cardiovascular:  chest pain  Resp: SOB  GI: no vomiting GU: no dysuria Integumentary: no rash  Allergy: no hives  Musculoskeletal: no leg swelling  Neurological: no slurred speech ROS otherwise negative  PAST MEDICAL HISTORY/PAST SURGICAL HISTORY:  Past Medical History:  Diagnosis Date  . Asthma     MEDICATIONS:  Prior to Admission medications   Medication Sig Start Date End Date Taking? Authorizing Provider  azithromycin (ZITHROMAX)  250 MG tablet Take 1 tablet (250 mg total) by mouth daily. Take first 2 tablets together, then 1 every day until finished. 01/17/17   Deatra Canter, FNP  benzonatate (TESSALON) 100 MG capsule Take 2 capsules (200 mg total) by mouth 3 (three) times daily as needed for cough. 01/17/17   Deatra Canter, FNP  oseltamivir (TAMIFLU) 75 MG capsule Take 1 capsule (75 mg total) by mouth every 12 (twelve) hours. 01/17/17   Deatra Canter, FNP    ALLERGIES:  No Known Allergies  SOCIAL HISTORY:  Social History   Tobacco Use  . Smoking status: Current Some Day Smoker    Packs/day: 0.00    Types: Cigarettes  . Smokeless tobacco: Never Used  Substance Use Topics  . Alcohol use: Yes    Alcohol/week: 6.0 oz    Types: 10 Standard drinks or equivalent per week    FAMILY HISTORY: No family history on file.  EXAM: BP 120/80 (BP Location: Right Arm)   Pulse (!) 107   Temp 98.5 F (36.9 C) (Oral)   Resp 16   Ht 6' 3.5" (1.918 m)   Wt 131.5 kg (290 lb)   SpO2 99%   BMI 35.77 kg/m  CONSTITUTIONAL: Alert and oriented and responds appropriately to questions. Well-appearing; well-nourished, obese, in no significant distress HEAD: Normocephalic EYES: Conjunctivae clear, pupils appear equal, EOMI ENT: normal nose; moist mucous membranes NECK: Supple, no meningismus, no nuchal  rigidity, no LAD  CARD: RRR; S1 and S2 appreciated; no murmurs, no clicks, no rubs, no gallops RESP: Normal chest excursion without splinting or tachypnea; breath sounds clear and equal bilaterally; no wheezes, no rhonchi, no rales, no hypoxia or respiratory distress, speaking full sentences ABD/GI: Normal bowel sounds; non-distended; soft, non-tender, no rebound, no guarding, no peritoneal signs, no hepatosplenomegaly BACK:  The back appears normal and is non-tender to palpation, there is no CVA tenderness EXT: Normal ROM in all joints; non-tender to palpation; no edema; normal capillary refill; no cyanosis, no calf  tenderness or swelling; 2+ radial and DP pulses bilaterally SKIN: Normal color for age and race; warm; no rash NEURO: Moves all extremities equally PSYCH: The patient's mood and manner are appropriate. Grooming and personal hygiene are appropriate.  MEDICAL DECISION MAKING: Patient here with complaints of chest pain.  Pain is mostly pleuritic in nature with shortness of breath.  He does report having recent pneumonia and laying in bed for almost a week straight.  He was found to have a positive troponin in the waiting room.  EKG does show some concerning ST morphology in the lateral leads but does not meet STEMI criteria.  Will obtain repeat EKG and give aspirin.  I am concerned for PE.  Will send him urgently for CTA of his chest.  Patient and family updated.  Will also call cardiology.  ED PROGRESS: Repeat EKG seems a little bit more concerning.  Again still does not necessarily meet criteria for STEMI but does have Q waves.  Will discuss urgently with cardiology.   1:35 AM  D/w Dr. Allena Earing who will see in ED. he agrees EKG is concerning but will hold off on calling a code STEMI at this time.  Agrees with CTA of the chest.    Patient CTA shows no large pulmonary embolus.  He does have some cardiomegaly with pulmonary edema.  Calcifications noted in the coronary arteries.  Because no obvious PE, pneumonia visualized, code STEMI initiated.  Concerning for anteroseptal and lateral injury.  Patient received heparin bolus and aspirin.  Given 1 nitroglycerin which helped his pain significantly but then dropped his pressures.  Will give fentanyl now for pain.   2:20 AM  Code STEMI cancelled by cardiology attending Dr. Allyson Sabal.  They will still admit patient to the cardiac ICU and plan to perform catheterization in the morning.  Patient and family updated with this plan.  Cardiology aware that patient is still having some mild chest discomfort.  Cardiology to order heparin drip.   I reviewed all nursing  notes, vitals, pertinent previous records, EKGs, lab and urine results, imaging (as available).     EKG Interpretation  Date/Time:  Sunday January 18 2018 00:08:07 EST Ventricular Rate:  101 PR Interval:  132 QRS Duration: 90 QT Interval:  362 QTC Calculation: 469 R Axis:   138 Text Interpretation:  Sinus tachycardia Septal infarct , age undetermined Lateral infarct , age undetermined Abnormal ECG No old tracing to compare Confirmed by Ward, Baxter Hire [phone]) on 01/18/2018 1:13:54 AM        EKG Interpretation  Date/Time:  Sunday January 18 2018 01:24:10 EST Ventricular Rate:  105 PR Interval:  132 QRS Duration: 91 QT Interval:  361 QTC Calculation: 478 R Axis:   137 Text Interpretation:  Sinus tachycardia Probable inferior infarct, recent Probable anterolateral infarct, recent Confirmed by Rochele Raring [phone]) on 01/18/2018 2:51:20 AM       EKG Interpretation  Date/Time:  Sunday January 18 2018 01:37:02 EST Ventricular Rate:  103 PR Interval:  132 QRS Duration: 92 QT Interval:  371 QTC Calculation: 486 R Axis:   138 Text Interpretation:  Sinus tachycardia Lateral infarct, recent ST elevation, consider inferior injury Confirmed by Rochele Raring [phone]) on 01/18/2018 2:51:54 AM        EKG Interpretation  Date/Time:  Sunday January 18 2018 01:52:46 EST Ventricular Rate:  107 PR Interval:  132 QRS Duration: 94 QT Interval:  362 QTC Calculation: 483 R Axis:   135 Text Interpretation:  Sinus tachycardia Consider right atrial enlargement Lateral infarct,  recent Probable anteroseptal infarct, old Minimal ST elevation, inferior leads Confirmed by Rochele Raring [phone]) on 01/18/2018 2:52:38 AM       EKG Interpretation  Date/Time:  Sunday January 18 2018 02:12:46 EST Ventricular Rate:  107 PR Interval:  132 QRS Duration: 88 QT Interval:  346 QTC Calculation: 462 R Axis:   140 Text Interpretation:  Sinus tachycardia Lateral infarct, recent Probable anteroseptal  infarct, old Borderline ST elevation, inferior leads Confirmed by Rochele Raring [phone]) on 01/18/2018 2:53:15 AM          CRITICAL CARE Performed by: Baxter Hire Ward   Total critical care time: 65 minutes  Critical care time was exclusive of separately billable procedures and treating other patients.  Critical care was necessary to treat or prevent imminent or life-threatening deterioration.  Critical care was time spent personally by me on the following activities: development of treatment plan with patient and/or surrogate as well as nursing, discussions with consultants, evaluation of patient's response to treatment, examination of patient, obtaining history from patient or surrogate, ordering and performing treatments and interventions, ordering and review of laboratory studies, ordering and review of radiographic studies, pulse oximetry and re-evaluation of patient's condition.    Ward, Layla Maw, DO 01/18/18 [phone]

## 2018-01-18 NOTE — Interval H&P Note (Signed)
Cath Lab Visit (complete for each Cath Lab visit)  Clinical Evaluation Leading to the Procedure:   ACS: Yes.    Non-ACS:    Anginal Classification: CCS III  Anti-ischemic medical therapy: No Therapy  Non-Invasive Test Results: No non-invasive testing performed  Prior CABG: No previous CABG      History and Physical Interval Note:  01/18/2018 12:43 PM  Sergio Hicks  has presented today for surgery, with the diagnosis of NSTEMI  The various methods of treatment have been discussed with the patient and family. After consideration of risks, benefits and other options for treatment, the patient has consented to  Procedure(s): LEFT HEART CATH AND CORONARY ANGIOGRAPHY (N/A) as a surgical intervention .  The patient's history has been reviewed, patient examined, no change in status, stable for surgery.  I have reviewed the patient's chart and labs.  Questions were answered to the patient's satisfaction.     Nanetta Batty

## 2018-01-18 NOTE — Progress Notes (Addendum)
ANTICOAGULATION CONSULT NOTE   Pharmacy Consult for Heparin  Indication: chest pain/ACS  No Known Allergies  Patient Measurements: Height: 6' 3.5" (191.8 cm) Weight: 292 lb 4.8 oz (132.6 kg) IBW/kg (Calculated) : 85.65  Vital Signs: Temp: 98.8 F (37.1 C) (01/20 0308) Temp Source: Oral (01/20 0308) BP: 93/69 (01/20 1200) Pulse Rate: 101 (01/20 1217)  Labs: Recent Labs    01/18/18 0017 01/18/18 0240 01/18/18 1017  HGB 14.3  --   --   HCT 40.7  --   --   PLT 363  --   --   APTT  --  112*  --   LABPROT  --  15.2  --   INR  --  1.21  --   HEPARINUNFRC  --   --  <0.10*  CREATININE 1.02  --   --   TROPONINI  --  7.59* 7.49*    Estimated Creatinine Clearance: 148 mL/min (by C-G formula based on SCr of 1.02 mg/dL).   Medical History: Past Medical History:  Diagnosis Date  . Asthma     Assessment: Heparin for chest pain, has been sick for the past week or so, pain worse with inspiration, troponin is elevated at 7.84.  Plan for cath now.  Goal of Therapy:  Heparin level 0.3-0.7 units/ml Monitor platelets by anticoagulation protocol: Yes   Plan:  Follow up after cath  Sheppard Coil PharmD., BCPS Clinical Pharmacist 01/18/2018 12:42 PM

## 2018-01-18 NOTE — H&P (Signed)
Cardiology Admission History and Physical:   Patient ID: Sergio Hicks; MRN: [id number]; DOB: [birth year]   Admission date: 01/18/2018  Primary Care Provider: Patient, No Pcp Per Primary Cardiologist: None Primary Electrophysiologist:  None   Chief Complaint:  Dyspnea  Patient Profile:   Sergio Hicks is a 37 y.o. male with a history of asthma who presents with dyspnea with workup concerning for ACS.   History of Present Illness:   Sergio Hicks is a 37 y.o. male with a history of asthma who presents with dyspnea with workup concerning for ACS.   The patient reports that he was in his normal state of health until December when he developed a flu-like illness that was treated with oseltamivir. His symptoms improved and then worsened about a week ago, when he developed dyspnea and cough productive of scant bloody sputum. He went to an urgent care and was diagnosed with pneumonia, for which he was prescribed azithromycin. He felt better with treatment until yesterday, when he developed worsening shortness of breath and chest discomfort. He describes the chest pain to be a sharp and pressure sensation in his left lower chest that is worse with inspiration. Given his persistent symptoms, he presented to the ED.  In the ED, the patient was afebrile with HR 90s-100s, SBP 80s-120s. ED evaluation was notable for troponin elevation to 7.8, positive D-Dimer and mild leukocytosis. Due to concern for PE, he underwent CTA that showed no embolism or gross airspace disease.; notably this study did show LAD calcification. ECG was ordered and showed sinus tachycardia with poor R wave progression/possible anterior Q waves, large Q waves and some degree of ST elevation in 1, AVL. Code STEMI was initially activated from the ED, which was subsequently cancelled on further discussion with the patient and interventional cardiologist on call. His chest pain improved following a dose of IV fentanyl and NTG, and he was  admitted to the cardiac ICU. Repeat troponin level was 7.59.  Past Medical History:  Diagnosis Date  . Asthma     History reviewed. No pertinent surgical history.   Medications Prior to Admission: Prior to Admission medications   Medication Sig Start Date End Date Taking? Authorizing Provider  azithromycin (ZITHROMAX) 250 MG tablet Take 1 tablet (250 mg total) by mouth daily. Take first 2 tablets together, then 1 every day until finished. 01/17/17   Deatra Canter, FNP  benzonatate (TESSALON) 100 MG capsule Take 2 capsules (200 mg total) by mouth 3 (three) times daily as needed for cough. 01/17/17   Deatra Canter, FNP  oseltamivir (TAMIFLU) 75 MG capsule Take 1 capsule (75 mg total) by mouth every 12 (twelve) hours. 01/17/17   Deatra Canter, FNP     Allergies:   No Known Allergies  Social History:   Social History   Socioeconomic History  . Marital status: Single    Spouse name: Not on file  . Number of children: Not on file  . Years of education: Not on file  . Highest education level: Not on file  Social Needs  . Financial resource strain: Not on file  . Food insecurity - worry: Not on file  . Food insecurity - inability: Not on file  . Transportation needs - medical: Not on file  . Transportation needs - non-medical: Not on file  Occupational History  . Not on file  Tobacco Use  . Smoking status: Current Some Day Smoker    Packs/day: 0.00    Types: Cigarettes  .  Smokeless tobacco: Never Used  Substance and Sexual Activity  . Alcohol use: Yes    Alcohol/week: 6.0 oz    Types: 10 Standard drinks or equivalent per week  . Drug use: Not on file  . Sexual activity: Not on file  Other Topics Concern  . Not on file  Social History Narrative  . 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 All other ROS reviewed and negative.     Physical Exam/Data:   Vitals:   01/18/18 0230  01/18/18 0308 01/18/18 0315 01/18/18 0400  BP: 105/69  98/62 100/75  Pulse: (!) 102  (!) 101 98  Resp: 16  (!) 21 19  Temp:  98.8 F (37.1 C)    TempSrc:  Oral    SpO2: 97%  94% 95%  Weight:  132.6 kg (292 lb 4.8 oz)    Height:  6' 3.5" (1.918 m)      Intake/Output Summary (Last 24 hours) at 01/18/2018 0424 Last data filed at 01/18/2018 0400 Gross per 24 hour  Intake 1002.5 ml  Output -  Net 1002.5 ml   Filed Weights   01/18/18 0016 01/18/18 0308  Weight: 131.5 kg (290 lb) 132.6 kg (292 lb 4.8 oz)   Body mass index is 36.05 kg/m.  General:  Obese, NAD HEENT: normal Neck: no apparent JVD Cardiac:  normal S1, S2; RRR; no murmur Lungs:  Coarse breath sounds in L > R lungs Abd: soft, nontender Ext: no LE edema Musculoskeletal:  No deformities, BUE and BLE strength normal and equal Skin: warm and dry  Neuro:  No focal abnormalities noted Psych:  Normal affect    EKG:  The ECG that was done was personally reviewed and demonstrates sinus tachycardia with poor R wave progression/possible anterior Q waves, large Q waves and some degree of ST elevation in 1, AVL.   Relevant CV Studies: None   Laboratory Data:  Chemistry Recent Labs  Lab 01/18/18 0017  NA 131*  K 3.1*  CL 100*  CO2 19*  GLUCOSE 161*  BUN 10  CREATININE 1.02  CALCIUM 8.2*  GFRNONAA >60  GFRAA >60  ANIONGAP 12    No results for input(s): PROT, ALBUMIN, AST, ALT, ALKPHOS, BILITOT in the last 168 hours. Hematology Recent Labs  Lab 01/18/18 0017  WBC 13.3*  RBC 4.50  HGB 14.3  HCT 40.7  MCV 90.4  MCH 31.8  MCHC 35.1  RDW 12.7  PLT 363   Cardiac Enzymes Recent Labs  Lab 01/18/18 0240  TROPONINI 7.59*    Recent Labs  Lab 01/18/18 0037  TROPIPOC 7.84*    BNPNo results for input(s): BNP, PROBNP in the last 168 hours.  DDimer  Recent Labs  Lab 01/18/18 0115  DDIMER 2.48*    Radiology/Studies:  Dg Chest 2 View  Result Date: 01/18/2018 CLINICAL DATA:  37 year old male with  chest pain and shortness of breath. EXAM: CHEST  2 VIEW COMPARISON:  None. FINDINGS: There is no focal consolidation, pleural effusion, or pneumothorax. Mild interstitial and peribronchial coarsening. Clinical correlation is recommended to evaluate for reactive small airway disease or viral infection. The cardiac silhouette is within normal limits. No acute osseous pathology. IMPRESSION: No focal consolidation Electronically Signed   By: Elgie Collard M.D.   On: 01/18/2018 00:35   Ct Angio Chest Pe W And/or Wo Contrast  Result Date: 01/18/2018 CLINICAL DATA:  Evaluate for  pulmonary embolus. Shortness of breath with chest pain. EXAM: CT ANGIOGRAPHY CHEST WITH CONTRAST TECHNIQUE: Multidetector CT imaging of the chest was performed using the standard protocol during bolus administration of intravenous contrast. Multiplanar CT image reconstructions and MIPs were obtained to evaluate the vascular anatomy. CONTRAST:  ISOVUE-370 IOPAMIDOL (ISOVUE-370) INJECTION 76% COMPARISON:  None. FINDINGS: Cardiovascular: There is moderate cardiac enlargement. No pericardial effusion. Calcifications identified within the LAD coronary artery. The main pulmonary artery is patent. There is no saddle embolus identified. Respiratory motion artifact is identified which diminishes lobar and segmental pulmonary artery detail. The pulmonary arterial opacification is also slightly suboptimal. Within this limitation no lobar or segmental pulmonary artery filling defects identified to suggest a clinically significant acute pulmonary embolus. Mediastinum/Nodes: The trachea appears patent and is midline. Normal appearance of the esophagus. No supraclavicular or axillary adenopathy. Right hilar node measures 1.7 cm, image 43 of series 5. No mediastinal adenopathy. Lungs/Pleura: There is bilateral and lower lobe predominant interlobular septal thickening. Diffuse ground-glass attenuation identified throughout both lungs. No lobar  consolidation or atelectasis. Upper Abdomen: No acute abnormality. Musculoskeletal: No chest wall abnormality. No acute or significant osseous findings. Review of the MIP images confirms the above findings. IMPRESSION: 1. Diminished exam detail due to respiratory motion artifact and suboptimal pulmonary arterial opacification. Within this limitation there is no central obstructing pulmonary embolus and no lobar or segmental pulmonary emboli identified. 2. Cardiac enlargement.  Lad coronary artery calcifications noted. 3. Mild to moderate pulmonary edema. Electronically Signed   By: Signa Kell M.D.   On: 01/18/2018 02:08    Assessment and Plan:   Sergio Hicks is a 37 y.o. male with a history of asthma who presents with dyspnea with workup concerning for ACS.   Positive troponin Abnormal ECG Chest pain The patient has an unremarkable past medical history and presents with dyspnea and atypical chest pain with workup revealing significantly elevated troponin and abnormal ECG. His ECG findings (large lateral Q waves with abnormal T wave morphology, as well as poor R-wave progression) suggest prior infarct with chronicity that is challenging to determine at this time, particularly given his mild symptoms with elevated (but now stable) troponin levels. He is young and without known heart disease, although he does have risk factors for CAD (obesity, tobacco use), and the CT findings of coronary calcification further support this diagnosis. His presentation therefore is concerning for ACS. DDx also includes myocarditis, particularly given recent infection. At this time, will treat empirically for ACS with plan for ischemic evaluation and other basic workup. -Admitting to cardiac ICU -Empiric heparin infusion started -NPO for probable LHC. Pt urged to let us know if he develops new/worsening symptoms with low threshold for cath lab reactivation. -Echocardiogram, BNP ordered -S/p ASA 325. Starting 81 mg  daily -Starting atorvastatin 80 mg daily -Starting metoprolol 12.5 mg BID -Lipid panel ordered and atorvastatin therapy initiated  Recent Pneumonia Leukocytosis  As above, it is unclear to what degree, if any, his recent diagnosis of PNA may be contributing to his current presentation -Continuing course of azithromycin  Asthma Not currently treated -PRN duonebs ordered  Severity of Illness: The appropriate patient status for this patient is INPATIENT. Inpatient status is judged to be reasonable and necessary in order to provide the required intensity of service to ensure the patient's safety. The patient's presenting symptoms, physical exam findings, and initial radiographic and laboratory data in the context of their chronic comorbidities is felt to place them at high risk for further  clinical deterioration. Furthermore, it is not anticipated that the patient will be medically stable for discharge from the hospital within 2 midnights of admission. The following factors support the patient status of inpatient.   " The patient's presenting symptoms include chest pain, dyspnea. " The worrisome physical exam findings include coarse breath sounds. " The initial radiographic and laboratory data are worrisome because of positive troponin, abnormal ECG. " The chronic co-morbidities include obesity.   * I certify that at the point of admission it is my clinical judgment that the patient will require inpatient hospital care spanning beyond 2 midnights from the point of admission due to high intensity of service, high risk for further deterioration and high frequency of surveillance required.*    For questions or updates, please contact [facility] Please consult [email or web address] for contact info under Cardiology/STEMI.    Signed, Ernest Mallick, MD  01/18/2018 4:24 AM

## 2018-01-18 NOTE — Progress Notes (Signed)
Called to the bedside to evaluate patient with abnormal EKG changes. He appears comfortable and playing games on his phone. EKG unchanged from admission EKG which was reviewed by interventional cardiology and deemed NOT to be a STEMI. I agree. There are EKG changes, however, suggestive of a recent event or possibly related to cardiomyopathy. CT scan of the chest was negative for PE, however, there is ground glass and cardiomegaly. He had a recent pulmonary infection. Flu swab negative. Treated with augmentin, steroids and cough syrup - only got marginally better. Troponin was markedly elevated on arrival at 7.84 -> 7.59, BNP is 909. He is hyponatremic and there is a leukocytosis at 13.3 (could be steroids). LDL elevated at 140. LAD coronary artery calcium was noted.  I agree the differential is most suspicious for either missed LAD infarct or a myocarditis. Either way, he will need left and possibly right heart catheterization. There is heart failure on exam - basilar crackles and elevated JVP. BNP is elevated. Give lasix now. Will obtain early echo and further treatement based on those findings.  Chrystie Nose, MD, [facility], FACP  [place]  [facility]  Medical Director of the [facility]  Card[facility]ovascular [facility] Clinic Diplomate of the American Board of Clinical Lipidology Attending Cardiologist  Direct Dial: [phone]  Fax: [phone]  Website:  [email or web address]

## 2018-01-18 NOTE — Progress Notes (Signed)
Patient placed on [facility] as he desats to low 80's while he is sleeping.  Delories Heinz, RN

## 2018-01-18 NOTE — ED Notes (Signed)
Patient transported to CT 

## 2018-01-18 NOTE — ED Notes (Signed)
Canceled STEMi per [facility]

## 2018-01-19 ENCOUNTER — Inpatient Hospital Stay (HOSPITAL_COMMUNITY): Payer: Self-pay

## 2018-01-19 ENCOUNTER — Encounter (HOSPITAL_COMMUNITY): Payer: Self-pay | Admitting: Cardiovascular Disease

## 2018-01-19 DIAGNOSIS — I2102 ST elevation (STEMI) myocardial infarction involving left anterior descending coronary artery: Secondary | ICD-10-CM

## 2018-01-19 LAB — CBC
HCT: 43 % (ref 39.0–52.0)
Hemoglobin: 14.5 g/dL (ref 13.0–17.0)
MCH: 30.8 pg (ref 26.0–34.0)
MCHC: 33.7 g/dL (ref 30.0–36.0)
MCV: 91.3 fL (ref 78.0–100.0)
PLATELETS: 373 10*3/uL (ref 150–400)
RBC: 4.71 MIL/uL (ref 4.22–5.81)
RDW: 12.6 % (ref 11.5–15.5)
WBC: 11.1 10*3/uL — AB (ref 4.0–10.5)

## 2018-01-19 LAB — BASIC METABOLIC PANEL
ANION GAP: 13 (ref 5–15)
BUN: 9 mg/dL (ref 6–20)
CO2: 25 mmol/L (ref 22–32)
Calcium: 8.6 mg/dL — ABNORMAL LOW (ref 8.9–10.3)
Chloride: 102 mmol/L (ref 101–111)
Creatinine, Ser: 0.96 mg/dL (ref 0.61–1.24)
Glucose, Bld: 135 mg/dL — ABNORMAL HIGH (ref 65–99)
POTASSIUM: 3.4 mmol/L — AB (ref 3.5–5.1)
SODIUM: 140 mmol/L (ref 135–145)

## 2018-01-19 MED ORDER — ENOXAPARIN SODIUM 60 MG/0.6ML ~~LOC~~ SOLN
60.0000 mg | SUBCUTANEOUS | Status: DC
  Administered 2018-01-19: 60 mg via SUBCUTANEOUS
  Filled 2018-01-19 (×3): qty 0.6

## 2018-01-19 MED ORDER — SPIRONOLACTONE 25 MG PO TABS
25.0000 mg | ORAL_TABLET | Freq: Every day | ORAL | Status: DC
  Administered 2018-01-19 – 2018-01-22 (×4): 25 mg via ORAL
  Filled 2018-01-19 (×4): qty 1

## 2018-01-19 NOTE — Progress Notes (Signed)
Advanced Heart Failure Rounding Note  PCP:  Primary Cardiologist:   Subjective:     Yesterday diuresed with IV lasix. Started on spiro, losartan, and carvedilol. Negative 2.9 liters. SBP low 100s  Prior to admit he had stopped drinking for a few weeks. Drinks 5-6 beers + 2-3 shots on most days.   Denies SOB, CP, orthopnea or PND     Objective:   Weight Range: 288 lb 5.8 oz (130.8 kg) Body mass index is 35.57 kg/m.   Vital Signs:   Temp:  [98.3 F (36.8 C)-98.9 F (37.2 C)] 98.8 F (37.1 C) (01/21 0754) Pulse Rate:  [0-109] 91 (01/21 0600) Resp:  [0-35] 21 (01/21 0600) BP: (84-130)/(60-93) 102/68 (01/21 0600) SpO2:  [0 %-100 %] 95 % (01/21 0600) Weight:  [288 lb 5.8 oz (130.8 kg)] 288 lb 5.8 oz (130.8 kg) (01/21 0600) Last BM Date: 01/17/18  Weight change: Filed Weights   01/18/18 0016 01/18/18 0308 01/19/18 0600  Weight: 290 lb (131.5 kg) 292 lb 4.8 oz (132.6 kg) 288 lb 5.8 oz (130.8 kg)    Intake/Output:   Intake/Output Summary (Last 24 hours) at 01/19/2018 0803 Last data filed at 01/18/2018 1703 Gross per 24 hour  Intake 319.17 ml  Output 3275 ml  Net -2955.83 ml      Physical Exam    General:  Well appearing. No resp difficulty HEENT: Normal Neck: Supple. JVP 9-10  . Carotids 2+ bilat; no bruits. No lymphadenopathy or thyromegaly appreciated. Cor: PMI nondisplaced. Regular rate & rhythm. No rubs, gallops or murmurs. Lungs: Clear Abdomen: obese, soft , nontender, nondistended. No hepatosplenomegaly. No bruits or masses. Good bowel sounds. Extremities: No cyanosis, clubbing, rash, edema Neuro: Alert & orientedx3, cranial nerves grossly intact. moves all 4 extremities w/o difficulty. Affect pleasant   Telemetry   NSR 90s personally reviewed.   EKG    N/A   Labs    CBC Recent Labs    01/18/18 0017 01/19/18 0314  WBC 13.3* 11.1*  NEUTROABS 9.0*  --   HGB 14.3 14.5  HCT 40.7 43.0  MCV 90.4 91.3  PLT 363 373   Basic Metabolic  Panel Recent Labs    01/18/18 0017 01/18/18 0457 01/19/18 0314  NA 131*  --  140  K 3.1*  --  3.4*  CL 100*  --  102  CO2 19*  --  25  GLUCOSE 161*  --  135*  BUN 10  --  9  CREATININE 1.02  --  0.96  CALCIUM 8.2*  --  8.6*  MG  --  2.2  --    Liver Function Tests No results for input(s): AST, ALT, ALKPHOS, BILITOT, PROT, ALBUMIN in the last 72 hours. No results for input(s): LIPASE, AMYLASE in the last 72 hours. Cardiac Enzymes Recent Labs    01/18/18 1017 01/18/18 1337 01/18/18 2055  TROPONINI 7.49* 6.28* 5.52*    BNP: BNP (last 3 results) Recent Labs    01/18/18 0457  BNP 909.5*    ProBNP (last 3 results) No results for input(s): PROBNP in the last 8760 hours.   D-Dimer Recent Labs    01/18/18 0115  DDIMER 2.48*   Hemoglobin A1C Recent Labs    01/18/18 0457  HGBA1C 5.5   Fasting Lipid Panel Recent Labs    01/18/18 0240  CHOL 178  HDL 20*  LDLCALC 140*  TRIG 89  CHOLHDL 8.9   Thyroid Function Tests No results for input(s): TSH, T4TOTAL, T3FREE, THYROIDAB in the  last 72 hours.  Invalid input(s): FREET3  Other results:   Imaging     No results found.   Medications:     Scheduled Medications: . aspirin EC  81 mg Oral Daily  . atorvastatin  80 mg Oral q1800  . carvedilol  3.125 mg Oral BID WC  . clopidogrel  75 mg Oral Q breakfast  . furosemide  40 mg Intravenous BID  . losartan  25 mg Oral QHS  . potassium chloride  40 mEq Oral Daily  . sodium chloride flush  3 mL Intravenous Q12H  . spironolactone  12.5 mg Oral Daily     Infusions: . sodium chloride       PRN Medications:  sodium chloride, acetaminophen, benzonatate, ipratropium-albuterol, morphine injection, nitroGLYCERIN, ondansetron (ZOFRAN) IV, sodium chloride flush    Patient Profile   37 y/o male admitted with OOH anterior MI and severe ICM. Found to have 100% LAD occlusion     Assessment/Plan  1. CAD with out of hospital anterior MI with late  presentation - cath 1/20 with total occlusion of LAD with R to L collaterals. Treated medcially. Pending viability study - ECHO EF 30%  - Continue DAPT and statin.  - CR consult  2. Ischemic CM with acute systolic HF - Echo EF ~30%. LVEDP 31 on cath - Increase spiro 25 mg daily - Continue carvedilol 3.125 mg twice a day  - Hold lasix until after MRI.  - Continue losartan -cMRI today to assess viability in anterior wall (size permitting) -consider LifeVest  3. Hypokalemia - Supp K   4. ETOH-  Discussed alcohol cessation. Drinks 6-7 beers almost daily.    Medication concerns reviewed with patient and pharmacy team. Barriers identified: No insurance. Will need HF fund.  Length of Stay: 1  Amy Clegg, NP  01/19/2018, 8:03 AM Advanced Heart Failure Team Pager [phone] (M-F; 7a - 4p)  Please contact [facility] for night-coverage after hours (4p -7a ) and weekends on [email or web address]  Patient seen and examined with Tonye Becket, NP. We discussed all aspects of the encounter. I agree with the assessment and plan as stated above.   Volume status improved. No further CP. No orthopnea or PND. Results of cath and echo reviewed with him personally. Will attempt to get cMRI today (size permitting) to assess for anterior wall viability. If significant viability will consider PCI LAD. Continue to titrate HF meds. HF Navigator to see. LifeVest consult.  Can go to tele. Will need CR.   Arvilla Meres, MD  8:56 AM

## 2018-01-19 NOTE — Care Management Note (Signed)
Case Management Note Donn Pierini RN, BSN Unit 4E-Case Manager [phone]  Patient Details  Name: Sergio Hicks MRN: [id number] Date of Birth: 1981-02-25  Subjective/Objective:  Pt admitted ACS, with out of hospital MI with late presentation- s/p cath-  Ischemic CM with acute systolic HF               Action/Plan: PTA pt lived at home- referral for life Vest needs- per chart pt will be assisted with medications through HF fund. Life Vest order form has been filled out and paperwork given along with order form to [employer] rep-Jim who will f/u with approval- pt will need LOG for lifevest- CM will continue to follow for transition of care needs.   Expected Discharge Date:  01/19/18               Expected Discharge Plan:  Home/Self Care  In-House Referral:     Discharge planning Services  CM Consult, Medication Assistance  Post Acute Care Choice:  Durable Medical Equipment Choice offered to:     DME Arranged:  Life vest DME Agency:  [employer]  HH Arranged:    HH Agency:     Status of Service:  In process, will continue to follow  If discussed at Long Length of Stay Meetings, dates discussed:    Discharge Disposition:   Additional Comments:  Darrold Span, RN 01/19/2018, 2:08 PM

## 2018-01-19 NOTE — Progress Notes (Signed)
Heart Failure Navigator Consult Note  Presentation: Per Dr Gala Romney: Sergio Hicks is seen today for evaluation of severe ischemic CM at the request of Dr. Rennis Golden.   He was in [place] until December when he developed a flu-like illness that was treated with oseltamivir. His symptoms improved and then worsened about a week ago, when he developed dyspnea and cough productive of scant bloody sputum. He went to an urgent care and was diagnosed with pneumonia, for which he was prescribed azithromycin. He felt better with treatment for a few days. Yesterday developed worsening SOB and CP sp came to ED.   In the ED, the patient was afebrile with HR 90s-100s, SBP 80s-120s. ED evaluation was notable for troponin elevation to 7.8, positive D-Dimer and mild leukocytosis. Due to concern for PE, he underwent CTA that showed no embolism or gross airspace disease.; notably this study did show LAD calcification. ECG was ordered and showed sinus tachycardia with poor R wave progression/possible anterior Q waves some degree of ST elevation in 1, AVL. Code STEMI was initially activated from the ED, which was subsequently cancelled on further discussion with the patient and interventional cardiologist on call.   Echo today shwed EF ~ 30% with WMA c/w anterior infarct. Underwent cath with Dr. Allyson Sabal which showed prox LAD occlusion with R to L collaterals LVEDP 31. Treated medically.   Past Medical History:  Diagnosis Date  . Asthma     Social History   Socioeconomic History  . Marital status: Single    Spouse name: None  . Number of children: None  . Years of education: None  . Highest education level: None  Social Needs  . Financial resource strain: None  . Food insecurity - worry: None  . Food insecurity - inability: None  . Transportation needs - medical: None  . Transportation needs - non-medical: None  Occupational History  . Occupation: chef  Tobacco Use  . Smoking status: Current Some Day Smoker   Packs/day: 0.00    Types: Cigarettes  . Smokeless tobacco: Never Used  Substance and Sexual Activity  . Alcohol use: Yes    Alcohol/week: 6.0 oz    Types: 10 Standard drinks or equivalent per week  . Drug use: None  . Sexual activity: None  Other Topics Concern  . None  Social History Narrative  . None    ECHO: 01/18/18: Study Conclusions  - Left ventricle: The cavity size was mildly dilated. Wall   thickness was increased in a pattern of mild LVH. Systolic   function was moderately to severely reduced. The estimated   ejection fraction was in the range of 30% to 35%. Anterior,   anteroseptal, apical and inferoapical akinesis - suggestive of   LAD territory ischemia/infarct. Doppler parameters are consistent   with abnormal left ventricular relaxation (grade 1 diastolic   dysfunction). The E/e&' ratio is >15, suggesting elevated LV   filling pressure. - Mitral valve: Mildly thickened leaflets . There was trivial   regurgitation. - Left atrium: The atrium was mildly dilated. - Systemic veins: The IVC measures <2.1 cm, but does not collapse   >50%, suggestive of an elevated RA pressure of 8 mmHg.  Impressions:  - LVEF 30-35% with large LAD territory WMA concerning for   ischemia/infarct, grade 1 DD with elevated LV filling pressure,   trivial MR, mild LAE, elevated RA pressure.  BNP    Component Value Date/Time   BNP 909.5 (H) 01/18/2018 0457    ProBNP No results found  for: PROBNP   Education Assessment and Provision:  Detailed education and instructions provided on heart failure disease management including the following:  Signs and symptoms of Heart Failure When to call the physician Importance of daily weights Low sodium diet Fluid restriction Medication management Anticipated future follow-up appointments  Patient education given on each of the above topics.  Patient acknowledges understanding and acceptance of all instructions.  I spoke with [name] and his mother regarding his current hospitalization and new HF diagnosis.  He tells me that he is "going to have to make some lifestyle changes".  He works as a Investment banker, operational at [employer].  I reviewed the importance of daily weights and when to contact the physician.  He does not have a scale yet his mother says they will get one.  I discussed a low sodium diet and fluid restrictions.  He acknowledges that a fluid restriction will be very difficult for him.  He says he drinks "a lot" of fluid everyday. I reviewed high sodium foods to avoid.  He admits that he will need assistance with getting medications at discharge and currently has no insurance.  I will send discharge medications to the [facility] to be filled through the HF Fund.  He will follow in the [facility].  He would benefit from HF [employer] and I will make referral to that program as well.  Education Materials:  "Living Better With Heart Failure" Booklet, Daily Weight Tracker Tool    High Risk Criteria for Readmission and/or Poor Patient Outcomes:  (Recommend Follow-up with Advanced Heart Failure Clinic)-- Yes    EF <30%- 30-35% with grade 1 dias dys  2 or more admissions in 6 months- New HF --No  Difficult social situation- No insurance noted  Demonstrates medication noncompliance- denies   Barriers of Care:  New HF, Knowledge and compliance  Discharge Planning:   Plans to return to home alone in [place]

## 2018-01-19 NOTE — Progress Notes (Signed)
01/19/2018 0930 Upon arrival to MRI and patient placed on table, pt. States he feels very "claustrophobic" and his "anxiety through the roof". Pt. States he does not want to complete cardiac MRI at this time. Request respected and pt. Assisted back to room in 2H19. Pt. States he would be willing to attempt again if he had medication prior to "help me relax".  Dr. Gala Romney paged and made aware. No new orders. Will continue to closely monitor patient.  Trinidad Petron, Blanchard Kelch

## 2018-01-19 NOTE — Progress Notes (Signed)
CARDIAC REHAB PHASE I   PRE:  Rate/Rhythm: 93 SR    BP: sitting 97/67    SaO2: 93 RA  MODE:  Ambulation: 740 ft   POST:  Rate/Rhythm: 110 ST    BP: sitting 112/79     SaO2: 95 RA  Tolerated well, no c/o. Steady pace. Gave MI book. Already has HF information. Had friends visiting so we will discuss more in depth later.  [phone]   Harriet Masson CES, ACSM 01/19/2018 2:53 PM

## 2018-01-19 NOTE — Progress Notes (Signed)
   01/19/18 1100  Clinical Encounter Type  Visited With Patient and family together  Visit Type Initial  Referral From Nurse  Consult/Referral To Chaplain  Spiritual Encounters  Spiritual Needs Literature   Responded to a SCC for information on a HCPA.  Patient was in the room with his mother.  Said he did want the information.  Reviewed the document with him and went over each part.  He wants to review it and said he would like to complete at somepoint while he is here.  Instructed the patient to let his nurse know and we can facilitate completion. Chaplain Agustin Cree

## 2018-01-20 ENCOUNTER — Encounter: Payer: Self-pay | Admitting: Internal Medicine

## 2018-01-20 ENCOUNTER — Encounter (HOSPITAL_COMMUNITY)
Admission: EM | Disposition: A | Discharge status: Home / Self Care | Payer: Self-pay | Source: Home / Self Care | Attending: Internal Medicine

## 2018-01-20 DIAGNOSIS — I2510 Atherosclerotic heart disease of native coronary artery without angina pectoris: Secondary | ICD-10-CM

## 2018-01-20 HISTORY — PX: CORONARY STENT INTERVENTION: CATH118234

## 2018-01-20 LAB — POCT ACTIVATED CLOTTING TIME
Activated Clotting Time: 351 seconds
Activated Clotting Time: 290 seconds

## 2018-01-20 LAB — BASIC METABOLIC PANEL
ANION GAP: 12 (ref 5–15)
BUN: 9 mg/dL (ref 6–20)
CALCIUM: 8.6 mg/dL — AB (ref 8.9–10.3)
CHLORIDE: 103 mmol/L (ref 101–111)
CO2: 21 mmol/L — ABNORMAL LOW (ref 22–32)
Creatinine, Ser: 0.84 mg/dL (ref 0.61–1.24)
GFR calc Af Amer: >60 mL/min (ref >60)
GFR calc non Af Amer: >60 mL/min (ref >60)
GLUCOSE: 99 mg/dL (ref 65–99)
Potassium: 3.9 mmol/L (ref 3.5–5.1)
Sodium: 136 mmol/L (ref 135–145)

## 2018-01-20 LAB — CBC
HEMATOCRIT: 41.2 % (ref 39.0–52.0)
Hemoglobin: 14 g/dL (ref 13.0–17.0)
MCH: 31.4 pg (ref 26.0–34.0)
MCHC: 34 g/dL (ref 30.0–36.0)
MCV: 92.4 fL (ref 78.0–100.0)
Platelets: 387 10*3/uL (ref 150–400)
RBC: 4.46 MIL/uL (ref 4.22–5.81)
RDW: 12.9 % (ref 11.5–15.5)
WBC: 11.9 10*3/uL — ABNORMAL HIGH (ref 4.0–10.5)

## 2018-01-20 SURGERY — CORONARY STENT INTERVENTION
Anesthesia: LOCAL

## 2018-01-20 MED ORDER — SODIUM CHLORIDE 0.9% FLUSH
3.0000 mL | Freq: Two times a day (BID) | INTRAVENOUS | Status: DC
  Administered 2018-01-20 – 2018-01-22 (×5): 3 mL via INTRAVENOUS

## 2018-01-20 MED ORDER — HEPARIN SODIUM (PORCINE) 1000 UNIT/ML IJ SOLN
INTRAMUSCULAR | Status: DC | PRN
  Administered 2018-01-20: 10000 [IU] via INTRAVENOUS

## 2018-01-20 MED ORDER — IOPAMIDOL (ISOVUE-370) INJECTION 76%
INTRAVENOUS | Status: AC
  Filled 2018-01-20: qty 50

## 2018-01-20 MED ORDER — MIDAZOLAM HCL 2 MG/2ML IJ SOLN
INTRAMUSCULAR | Status: DC | PRN
  Administered 2018-01-20: 2 mg via INTRAVENOUS

## 2018-01-20 MED ORDER — FENTANYL CITRATE (PF) 100 MCG/2ML IJ SOLN
INTRAMUSCULAR | Status: DC | PRN
  Administered 2018-01-20: 25 ug via INTRAVENOUS

## 2018-01-20 MED ORDER — LIDOCAINE HCL (PF) 1 % IJ SOLN
INTRAMUSCULAR | Status: DC | PRN
  Administered 2018-01-20: 2 mL

## 2018-01-20 MED ORDER — HEPARIN SODIUM (PORCINE) 1000 UNIT/ML IJ SOLN
INTRAMUSCULAR | Status: AC
  Filled 2018-01-20: qty 1

## 2018-01-20 MED ORDER — MIDAZOLAM HCL 2 MG/2ML IJ SOLN
INTRAMUSCULAR | Status: AC
  Filled 2018-01-20: qty 2

## 2018-01-20 MED ORDER — CLOPIDOGREL BISULFATE 300 MG PO TABS
600.0000 mg | ORAL_TABLET | ORAL | Status: AC
  Administered 2018-01-20: 600 mg via ORAL
  Filled 2018-01-20: qty 2

## 2018-01-20 MED ORDER — SODIUM CHLORIDE 0.9 % IV SOLN: 250.0000 mL | INTRAVENOUS | Status: DC | PRN

## 2018-01-20 MED ORDER — VERAPAMIL HCL 2.5 MG/ML IV SOLN
INTRAVENOUS | Status: DC | PRN
  Administered 2018-01-20: 14:00:00 via INTRA_ARTERIAL

## 2018-01-20 MED ORDER — SODIUM CHLORIDE 0.9% FLUSH: 3.0000 mL | INTRAVENOUS | Status: DC | PRN

## 2018-01-20 MED ORDER — SODIUM CHLORIDE 0.9 % IV SOLN: INTRAVENOUS | Status: DC

## 2018-01-20 MED ORDER — IOPAMIDOL (ISOVUE-370) INJECTION 76%
INTRAVENOUS | Status: AC
  Filled 2018-01-20: qty 125

## 2018-01-20 MED ORDER — HEPARIN (PORCINE) IN NACL 2-0.9 UNIT/ML-% IJ SOLN
INTRAMUSCULAR | Status: AC | PRN
  Administered 2018-01-20: 1000 mL via INTRA_ARTERIAL

## 2018-01-20 MED ORDER — HEPARIN (PORCINE) IN NACL 2-0.9 UNIT/ML-% IJ SOLN
INTRAMUSCULAR | Status: AC
  Filled 2018-01-20: qty 1000

## 2018-01-20 MED ORDER — NITROGLYCERIN 1 MG/10 ML FOR IR/CATH LAB
INTRA_ARTERIAL | Status: DC | PRN
  Administered 2018-01-20: 200 ug via INTRACORONARY
  Administered 2018-01-20 (×2): 100 ug via INTRACORONARY

## 2018-01-20 MED ORDER — FENTANYL CITRATE (PF) 100 MCG/2ML IJ SOLN
INTRAMUSCULAR | Status: AC
  Filled 2018-01-20: qty 2

## 2018-01-20 MED ORDER — ENOXAPARIN SODIUM 40 MG/0.4ML ~~LOC~~ SOLN
40.0000 mg | SUBCUTANEOUS | Status: DC
  Administered 2018-01-21 – 2018-01-22 (×2): 40 mg via SUBCUTANEOUS
  Filled 2018-01-20 (×2): qty 0.4

## 2018-01-20 MED ORDER — NITROGLYCERIN 1 MG/10 ML FOR IR/CATH LAB
INTRA_ARTERIAL | Status: AC
  Filled 2018-01-20: qty 10

## 2018-01-20 MED ORDER — VERAPAMIL HCL 2.5 MG/ML IV SOLN
INTRAVENOUS | Status: AC
  Filled 2018-01-20: qty 2

## 2018-01-20 MED ORDER — FUROSEMIDE 10 MG/ML IJ SOLN
40.0000 mg | Freq: Two times a day (BID) | INTRAMUSCULAR | Status: DC
  Filled 2018-01-20: qty 4

## 2018-01-20 MED ORDER — SODIUM CHLORIDE 0.9% FLUSH: 3.0000 mL | Freq: Two times a day (BID) | INTRAVENOUS | Status: DC

## 2018-01-20 MED ORDER — IOPAMIDOL (ISOVUE-370) INJECTION 76%
INTRAVENOUS | Status: DC | PRN
  Administered 2018-01-20: 120 mL via INTRA_ARTERIAL

## 2018-01-20 SURGICAL SUPPLY — 19 items
BALLN SAPPHIRE 2.5X15 (BALLOONS) ×2
BALLN SAPPHIRE ~~LOC~~ 3.5X12 (BALLOONS) ×2 IMPLANT
BALLN ~~LOC~~ EUPHORA RX 4.5X15 (BALLOONS) ×2
BALLOON SAPPHIRE 2.5X15 (BALLOONS) ×1 IMPLANT
BALLOON ~~LOC~~ EUPHORA RX 4.5X15 (BALLOONS) ×1 IMPLANT
CATH VISTA GUIDE 6FR XBLAD3.5 (CATHETERS) ×2 IMPLANT
DEVICE RAD COMP TR BAND LRG (VASCULAR PRODUCTS) ×2 IMPLANT
GLIDESHEATH SLEND SS 6F .021 (SHEATH) ×2 IMPLANT
GUIDEWIRE INQWIRE 1.5J.035X260 (WIRE) ×1 IMPLANT
INQWIRE 1.5J .035X260CM (WIRE) ×2
KIT ENCORE 26 ADVANTAGE (KITS) ×4 IMPLANT
KIT HEART LEFT (KITS) ×2 IMPLANT
KIT HEMO VALVE WATCHDOG (MISCELLANEOUS) ×2 IMPLANT
PACK CARDIAC CATHETERIZATION (CUSTOM PROCEDURE TRAY) ×2 IMPLANT
STENT SYNERGY DES 3X32 (Permanent Stent) ×2 IMPLANT
STENT SYNERGY DES 4X28 (Permanent Stent) ×2 IMPLANT
TRANSDUCER W/STOPCOCK (MISCELLANEOUS) ×2 IMPLANT
TUBING CIL FLEX 10 FLL-RA (TUBING) ×2 IMPLANT
WIRE ASAHI PROWATER 180CM (WIRE) ×2 IMPLANT

## 2018-01-20 NOTE — H&P (View-Only) (Signed)
Advanced Heart Failure Rounding Note  PCP:  Primary Cardiologist: Bensimhon   Subjective:     Yesterday spiro was increased to 25 mg daily.   Had chest pain over night. Resolved with IV morphine.   Denies SOB.   No CP now. BP soft in 80s.    Objective:   Weight Range: 288 lb 5.8 oz (130.8 kg) Body mass index is 35.57 kg/m.   Vital Signs:   Temp:  [97.8 F (36.6 C)-98.8 F (37.1 C)] 98.8 F (37.1 C) (01/22 0335) Pulse Rate:  [88-102] 88 (01/22 0400) Resp:  [13-28] 23 (01/22 0400) BP: (90-124)/(61-91) 90/62 (01/22 0335) SpO2:  [94 %-100 %] 100 % (01/22 0400) Last BM Date: 01/19/18  Weight change: Filed Weights   01/18/18 0016 01/18/18 0308 01/19/18 0600  Weight: 290 lb (131.5 kg) 292 lb 4.8 oz (132.6 kg) 288 lb 5.8 oz (130.8 kg)    Intake/Output:   Intake/Output Summary (Last 24 hours) at 01/20/2018 0732 Last data filed at 01/19/2018 2337 Gross per 24 hour  Intake 720 ml  Output 1075 ml  Net -355 ml      Physical Exam    General: No resp difficulty HEENT: normal Neck: supple. JVP 6-7 Carotids 2+ bilat; no bruits. No lymphadenopathy or thryomegaly appreciated. Cor: PMI nondisplaced. Regular rate & rhythm. No rubs, gallops or murmurs. Lungs: clear Abdomen: obese, soft, nontender, nondistended. No hepatosplenomegaly. No bruits or masses. Good bowel sounds. Extremities: no cyanosis, clubbing, rash, edema Neuro: alert & orientedx3, cranial nerves grossly intact. moves all 4 extremities w/o difficulty. Affect pleasant   Telemetry   NSR 80-90s personally reviewed.   EKG    N/A   Labs    CBC Recent Labs    01/18/18 0017 01/19/18 0314  WBC 13.3* 11.1*  NEUTROABS 9.0*  --   HGB 14.3 14.5  HCT 40.7 43.0  MCV 90.4 91.3  PLT 363 373   Basic Metabolic Panel Recent Labs    00/45/99 0457 01/19/18 0314 01/20/18 0429  NA  --  140 136  K  --  3.4* 3.9  CL  --  102 103  CO2  --  25 21*  GLUCOSE  --  135* 99  BUN  --  9 9  CREATININE  --   0.96 0.84  CALCIUM  --  8.6* 8.6*  MG 2.2  --   --    Liver Function Tests No results for input(s): AST, ALT, ALKPHOS, BILITOT, PROT, ALBUMIN in the last 72 hours. No results for input(s): LIPASE, AMYLASE in the last 72 hours. Cardiac Enzymes Recent Labs    01/18/18 1017 01/18/18 1337 01/18/18 2055  TROPONINI 7.49* 6.28* 5.52*    BNP: BNP (last 3 results) Recent Labs    01/18/18 0457  BNP 909.5*    ProBNP (last 3 results) No results for input(s): PROBNP in the last 8760 hours.   D-Dimer Recent Labs    01/18/18 0115  DDIMER 2.48*   Hemoglobin A1C Recent Labs    01/18/18 0457  HGBA1C 5.5   Fasting Lipid Panel Recent Labs    01/18/18 0240  CHOL 178  HDL 20*  LDLCALC 140*  TRIG 89  CHOLHDL 8.9   Thyroid Function Tests No results for input(s): TSH, T4TOTAL, T3FREE, THYROIDAB in the last 72 hours.  Invalid input(s): FREET3  Other results:   Imaging    No results found.   Medications:     Scheduled Medications: . aspirin EC  81 mg Oral Daily  .  atorvastatin  80 mg Oral q1800  . carvedilol  3.125 mg Oral BID WC  . clopidogrel  75 mg Oral Q breakfast  . enoxaparin (LOVENOX) injection  60 mg Subcutaneous Q24H  . losartan  25 mg Oral QHS  . potassium chloride  40 mEq Oral Daily  . sodium chloride flush  3 mL Intravenous Q12H  . spironolactone  25 mg Oral Daily    Infusions: . sodium chloride      PRN Medications: sodium chloride, acetaminophen, benzonatate, ipratropium-albuterol, morphine injection, nitroGLYCERIN, ondansetron (ZOFRAN) IV, sodium chloride flush    Patient Profile   37 y/o male admitted with OOH anterior MI and severe ICM. Found to have 100% LAD occlusion     Assessment/Plan  1. CAD with out of hospital anterior MI with late presentation - cath 1/20 with total occlusion of LAD with R to L collaterals. Treated medcially.  - CP overnight. Dr Gala Romney will review interventionalist.  ECHO EF 30%  - Continue DAPT and  statin.  - CR consult  2. Ischemic CM with acute systolic HF - Echo EF ~30%. LVEDP 31 on cath - Volume status elevated. Give one dose of lasix today.  - Continue spiro 25 mg daily - Continue carvedilol 3.125 mg twice a day  - Continue losartan 25 mg daily.  -cMRI to assess viability in anterior wall (size permitting)--> unable to complete - LifeVest ordered.   3. Hypokalemia -Resolved.  4. ETOH-  Discussed alcohol cessation. Drinks 6-7 beers almost daily.    Medication concerns reviewed with patient and pharmacy team. Barriers identified: No insurance. Will need HF fund.  Length of Stay: 2  Tonye Becket, NP  01/20/2018, 7:32 AM Advanced Heart Failure Team Pager [phone] (M-F; 7a - 4p)  Please contact [facility] for night-coverage after hours (4p -7a ) and weekends on [email or web address]  Patient seen and examined with Tonye Becket, NP. We discussed all aspects of the encounter. I agree with the assessment and plan as stated above.   He remains tenuous. BP soft in 80s. Volume status mildly elevated. Had recurrent CP overnight. No CP free. Cath films and ECG reviewed. He has mild collaterals from RCA to LAD. Will plan salvage PCI today. Will need LifeVest. May need to cut HF meds back with low BP.   Arvilla Meres, MD  9:34 AM

## 2018-01-20 NOTE — Progress Notes (Signed)
Advanced Heart Failure Rounding Note  PCP:  Primary Cardiologist: Paymon Rosensteel   Subjective:     Yesterday spiro was increased to 25 mg daily.   Had chest pain over night. Resolved with IV morphine.   Denies SOB.   No CP now. BP soft in 80s.    Objective:   Weight Range: 288 lb 5.8 oz (130.8 kg) Body mass index is 35.57 kg/m.   Vital Signs:   Temp:  [97.8 F (36.6 C)-98.8 F (37.1 C)] 98.8 F (37.1 C) (01/22 0335) Pulse Rate:  [88-102] 88 (01/22 0400) Resp:  [13-28] 23 (01/22 0400) BP: (90-124)/(61-91) 90/62 (01/22 0335) SpO2:  [94 %-100 %] 100 % (01/22 0400) Last BM Date: 01/19/18  Weight change: Filed Weights   01/18/18 0016 01/18/18 0308 01/19/18 0600  Weight: 290 lb (131.5 kg) 292 lb 4.8 oz (132.6 kg) 288 lb 5.8 oz (130.8 kg)    Intake/Output:   Intake/Output Summary (Last 24 hours) at 01/20/2018 0732 Last data filed at 01/19/2018 2337 Gross per 24 hour  Intake 720 ml  Output 1075 ml  Net -355 ml      Physical Exam    General: No resp difficulty HEENT: normal Neck: supple. JVP 6-7 Carotids 2+ bilat; no bruits. No lymphadenopathy or thryomegaly appreciated. Cor: PMI nondisplaced. Regular rate & rhythm. No rubs, gallops or murmurs. Lungs: clear Abdomen: obese, soft, nontender, nondistended. No hepatosplenomegaly. No bruits or masses. Good bowel sounds. Extremities: no cyanosis, clubbing, rash, edema Neuro: alert & orientedx3, cranial nerves grossly intact. moves all 4 extremities w/o difficulty. Affect pleasant   Telemetry   NSR 80-90s personally reviewed.   EKG    N/A   Labs    CBC Recent Labs    01/18/18 0017 01/19/18 0314  WBC 13.3* 11.1*  NEUTROABS 9.0*  --   HGB 14.3 14.5  HCT 40.7 43.0  MCV 90.4 91.3  PLT 363 373   Basic Metabolic Panel Recent Labs    00/45/99 0457 01/19/18 0314 01/20/18 0429  NA  --  140 136  K  --  3.4* 3.9  CL  --  102 103  CO2  --  25 21*  GLUCOSE  --  135* 99  BUN  --  9 9  CREATININE  --   0.96 0.84  CALCIUM  --  8.6* 8.6*  MG 2.2  --   --    Liver Function Tests No results for input(s): AST, ALT, ALKPHOS, BILITOT, PROT, ALBUMIN in the last 72 hours. No results for input(s): LIPASE, AMYLASE in the last 72 hours. Cardiac Enzymes Recent Labs    01/18/18 1017 01/18/18 1337 01/18/18 2055  TROPONINI 7.49* 6.28* 5.52*    BNP: BNP (last 3 results) Recent Labs    01/18/18 0457  BNP 909.5*    ProBNP (last 3 results) No results for input(s): PROBNP in the last 8760 hours.   D-Dimer Recent Labs    01/18/18 0115  DDIMER 2.48*   Hemoglobin A1C Recent Labs    01/18/18 0457  HGBA1C 5.5   Fasting Lipid Panel Recent Labs    01/18/18 0240  CHOL 178  HDL 20*  LDLCALC 140*  TRIG 89  CHOLHDL 8.9   Thyroid Function Tests No results for input(s): TSH, T4TOTAL, T3FREE, THYROIDAB in the last 72 hours.  Invalid input(s): FREET3  Other results:   Imaging    No results found.   Medications:     Scheduled Medications: . aspirin EC  81 mg Oral Daily  .  atorvastatin  80 mg Oral q1800  . carvedilol  3.125 mg Oral BID WC  . clopidogrel  75 mg Oral Q breakfast  . enoxaparin (LOVENOX) injection  60 mg Subcutaneous Q24H  . losartan  25 mg Oral QHS  . potassium chloride  40 mEq Oral Daily  . sodium chloride flush  3 mL Intravenous Q12H  . spironolactone  25 mg Oral Daily    Infusions: . sodium chloride      PRN Medications: sodium chloride, acetaminophen, benzonatate, ipratropium-albuterol, morphine injection, nitroGLYCERIN, ondansetron (ZOFRAN) IV, sodium chloride flush    Patient Profile   37 y/o male admitted with OOH anterior MI and severe ICM. Found to have 100% LAD occlusion     Assessment/Plan  1. CAD with out of hospital anterior MI with late presentation - cath 1/20 with total occlusion of LAD with R to L collaterals. Treated medcially.  - CP overnight. Dr Gala Romney will review interventionalist.  ECHO EF 30%  - Continue DAPT and  statin.  - CR consult  2. Ischemic CM with acute systolic HF - Echo EF ~30%. LVEDP 31 on cath - Volume status elevated. Give one dose of lasix today.  - Continue spiro 25 mg daily - Continue carvedilol 3.125 mg twice a day  - Continue losartan 25 mg daily.  -cMRI to assess viability in anterior wall (size permitting)--> unable to complete - LifeVest ordered.   3. Hypokalemia -Resolved.  4. ETOH-  Discussed alcohol cessation. Drinks 6-7 beers almost daily.    Medication concerns reviewed with patient and pharmacy team. Barriers identified: No insurance. Will need HF fund.  Length of Stay: 2  Tonye Becket, NP  01/20/2018, 7:32 AM Advanced Heart Failure Team Pager [phone] (M-F; 7a - 4p)  Please contact [facility] for night-coverage after hours (4p -7a ) and weekends on [email or web address]  Patient seen and examined with Tonye Becket, NP. We discussed all aspects of the encounter. I agree with the assessment and plan as stated above.   He remains tenuous. BP soft in 80s. Volume status mildly elevated. Had recurrent CP overnight. No CP free. Cath films and ECG reviewed. He has mild collaterals from RCA to LAD. Will plan salvage PCI today. Will need LifeVest. May need to cut HF meds back with low BP.   Arvilla Meres, MD  9:34 AM

## 2018-01-20 NOTE — Interval H&P Note (Signed)
History and Physical Interval Note:  01/20/2018 1:40 PM  Sergio Hicks  has presented today for surgery, with the diagnosis of cad  The various methods of treatment have been discussed with the patient and family. After consideration of risks, benefits and other options for treatment, the patient has consented to  Procedure(s): CORONARY STENT INTERVENTION (N/A) as a surgical intervention .  The patient's history has been reviewed, patient examined, no change in status, stable for surgery.  I have reviewed the patient's chart and labs.  Questions were answered to the patient's satisfaction.   Cath Lab Visit (complete for each Cath Lab visit)  Clinical Evaluation Leading to the Procedure:   ACS: Yes.    Non-ACS:    Anginal Classification: CCS IV  Anti-ischemic medical therapy: Maximal Therapy (2 or more classes of medications)  Non-Invasive Test Results: No non-invasive testing performed  Prior CABG: No previous CABG        Theron Arista [facility] 01/20/2018 1:40 PM

## 2018-01-21 ENCOUNTER — Telehealth (HOSPITAL_COMMUNITY): Payer: Self-pay

## 2018-01-21 ENCOUNTER — Encounter (HOSPITAL_COMMUNITY): Payer: Self-pay | Admitting: Cardiology

## 2018-01-21 LAB — CBC
HEMATOCRIT: 40 % (ref 39.0–52.0)
HEMOGLOBIN: 13.7 g/dL (ref 13.0–17.0)
MCH: 31.4 pg (ref 26.0–34.0)
MCHC: 34.3 g/dL (ref 30.0–36.0)
MCV: 91.7 fL (ref 78.0–100.0)
Platelets: 400 10*3/uL (ref 150–400)
RBC: 4.36 MIL/uL (ref 4.22–5.81)
RDW: 12.5 % (ref 11.5–15.5)
WBC: 11.1 10*3/uL — AB (ref 4.0–10.5)

## 2018-01-21 LAB — BASIC METABOLIC PANEL
ANION GAP: 10 (ref 5–15)
BUN: 10 mg/dL (ref 6–20)
CO2: 22 mmol/L (ref 22–32)
Calcium: 8.4 mg/dL — ABNORMAL LOW (ref 8.9–10.3)
Chloride: 101 mmol/L (ref 101–111)
Creatinine, Ser: 0.91 mg/dL (ref 0.61–1.24)
GFR calc Af Amer: >60 mL/min (ref >60)
GFR calc non Af Amer: >60 mL/min (ref >60)
GLUCOSE: 120 mg/dL — AB (ref 65–99)
POTASSIUM: 4 mmol/L (ref 3.5–5.1)
Sodium: 133 mmol/L — ABNORMAL LOW (ref 135–145)

## 2018-01-21 MED ORDER — POTASSIUM CHLORIDE CRYS ER 20 MEQ PO TBCR
40.0000 meq | EXTENDED_RELEASE_TABLET | Freq: Once | ORAL | Status: AC
  Administered 2018-01-21: 40 meq via ORAL
  Filled 2018-01-21: qty 2

## 2018-01-21 MED ORDER — FUROSEMIDE 10 MG/ML IJ SOLN
40.0000 mg | Freq: Once | INTRAMUSCULAR | Status: AC
  Administered 2018-01-21: 40 mg via INTRAVENOUS
  Filled 2018-01-21: qty 4

## 2018-01-21 NOTE — Progress Notes (Addendum)
NCM spoke to [place], CH[facility] RN and they will arrange for pt to follow up in HF clinic and assist him with medications. Contacted [facility] and [facility] and no appts available. Will attempt to arrange appt on 01/22/2018 with [facility]. [facility] will not have any appt until Feb and pt can call Feb 1. Isidoro Donning RN CCM Case Mgmt phone [phone]

## 2018-01-21 NOTE — Progress Notes (Signed)
Advanced Heart Failure Rounding Note  PCP:  Primary Cardiologist: Bensimhon   Subjective:     [age] S/P DES LAD  Complaining of wet cough. Denies SOB. Anxious about how much marijuana he can use and how much alcohol he drink.    No orthopnea or PND. No further CP.   Objective:   Weight Range: 292 lb 5.3 oz (132.6 kg) Body mass index is 36.06 kg/m.   Vital Signs:   Temp:  [98.1 F (36.7 C)-98.5 F (36.9 C)] 98.5 F (36.9 C) (01/23 0743) Pulse Rate:  [0-105] 86 (01/23 0700) Resp:  [4-35] 18 (01/23 0700) BP: (89-140)/(50-88) 124/85 (01/23 0700) SpO2:  [0 %-100 %] 99 % (01/23 0700) Weight:  [292 lb 5.3 oz (132.6 kg)] 292 lb 5.3 oz (132.6 kg) (01/23 0500) Last BM Date: 01/20/18  Weight change: Filed Weights   01/18/18 0308 01/19/18 0600 01/21/18 0500  Weight: 292 lb 4.8 oz (132.6 kg) 288 lb 5.8 oz (130.8 kg) 292 lb 5.3 oz (132.6 kg)    Intake/Output:   Intake/Output Summary (Last 24 hours) at 01/21/2018 0815 Last data filed at 01/21/2018 0600 Gross per 24 hour  Intake 180 ml  Output 725 ml  Net -545 ml      Physical Exam    General:  No resp difficulty. In bed.  HEENT: normal Neck: supple. JVP ~10 . Carotids 2+ bilat; no bruits. No lymphadenopathy or thryomegaly appreciated. Cor: PMI nondisplaced. Regular rate & rhythm. No rubs, gallops or murmurs. Lungs: clear Abdomen: obese, soft, nontender, nondistended. No hepatosplenomegaly. No bruits or masses. Good bowel sounds. Extremities: no cyanosis, clubbing, rash, edema Neuro: alert & orientedx3, cranial nerves grossly intact. moves all 4 extremities w/o difficulty. Affect pleasant   Telemetry   NSR 80s personally reviewed.   EKG    N/A   Labs    CBC Recent Labs    01/20/18 0429 01/21/18 0248  WBC 11.9* 11.1*  HGB 14.0 13.7  HCT 41.2 40.0  MCV 92.4 91.7  PLT 387 400   Basic Metabolic Panel Recent Labs    13/08/65 0429 01/21/18 0248  NA 136 133*  K 3.9 4.0  CL 103 101  CO2 21* 22    GLUCOSE 99 120*  BUN 9 10  CREATININE 0.84 0.91  CALCIUM 8.6* 8.4*   Liver Function Tests No results for input(s): AST, ALT, ALKPHOS, BILITOT, PROT, ALBUMIN in the last 72 hours. No results for input(s): LIPASE, AMYLASE in the last 72 hours. Cardiac Enzymes Recent Labs    01/18/18 1017 01/18/18 1337 01/18/18 2055  TROPONINI 7.49* 6.28* 5.52*    BNP: BNP (last 3 results) Recent Labs    01/18/18 0457  BNP 909.5*    ProBNP (last 3 results) No results for input(s): PROBNP in the last 8760 hours.   D-Dimer No results for input(s): DDIMER in the last 72 hours. Hemoglobin A1C No results for input(s): HGBA1C in the last 72 hours. Fasting Lipid Panel No results for input(s): CHOL, HDL, LDLCALC, TRIG, CHOLHDL, LDLDIRECT in the last 72 hours. Thyroid Function Tests No results for input(s): TSH, T4TOTAL, T3FREE, THYROIDAB in the last 72 hours.  Invalid input(s): FREET3  Other results:   Imaging    No results found.   Medications:     Scheduled Medications: . aspirin EC  81 mg Oral Daily  . atorvastatin  80 mg Oral q1800  . carvedilol  3.125 mg Oral BID WC  . clopidogrel  75 mg Oral Q breakfast  .  enoxaparin (LOVENOX) injection  40 mg Subcutaneous Q24H  . losartan  25 mg Oral QHS  . potassium chloride  40 mEq Oral Daily  . sodium chloride flush  3 mL Intravenous Q12H  . sodium chloride flush  3 mL Intravenous Q12H  . spironolactone  25 mg Oral Daily    Infusions: . sodium chloride    . sodium chloride      PRN Medications: sodium chloride, sodium chloride, acetaminophen, benzonatate, ipratropium-albuterol, morphine injection, nitroGLYCERIN, ondansetron (ZOFRAN) IV, sodium chloride flush, sodium chloride flush    Patient Profile   37 y/o male admitted with OOH anterior MI and severe ICM. Found to have 100% LAD occlusion    Assessment/Plan   1. CAD with out of hospital anterior MI with late presentation - cath 1/20 with total occlusion of LAD with  R to L collaterals.  - 1/22 S/P DES to proximal/mid LAD.Marland Kitchen  - ECHO EF 30%  - Continue DAPT and statin.  - CR consult  2. Ischemic CM with acute systolic HF - Echo EF ~30%. LVEDP 31 on cath - Mild volume overload. Give 40 mg IV lasix x1.  - Continue spiro 25 mg daily - Continue carvedilol 3.125 mg twice a day  - Continue losartan 25 mg daily.  -cMRI to assess viability in anterior wall (size permitting)--> unable to complete - LifeVest ordered.   3. Hypokalemia -Resolved.   4. ETOH-  Discussed alcohol cessation. Drinks 6-7 beers almost daily.    Medication concerns reviewed with patient and pharmacy team. Barriers identified: No insurance. Will need HF fund.   Pharmacy to follow up later today for education.    Length of Stay: 3  Amy Clegg, NP  01/21/2018, 8:15 AM Advanced Heart Failure Team Pager [phone] (M-F; 7a - 4p)  Please contact [facility] for night-coverage after hours (4p -7a ) and weekends on [email or web address]  Patient seen and examined with Tonye Becket, NP. We discussed all aspects of the encounter. I agree with the assessment and plan as stated above.   Cath films reviewed personally. LAD stented. Initially with sluggish flow post stent but improved on final images. Has jailed diagonal.   Volume status looks up today. Agree with IV lasix. Continue HF med titration as BP tolerates.   Long talk with him and his mother about his HF and need for changing his dietary and exercise habits. Explained medication regimen.   Total time spent 35 minutes. Over half that time spent discussing above.   Arvilla Meres, MD  3:34 PM

## 2018-01-21 NOTE — Progress Notes (Signed)
Report called to receiving RN Melvan 3E-22. All patient belongings gathered and transferred with patient including the Xbox and TV from rec therapy.  Patients father with pateitn and aware of transfer.  BP 91/67   Pulse 84   Temp 98.1 F (36.7 C) (Oral)   Resp (!) 21   Ht 6' 3.5" (1.918 m)   Wt 132.6 kg (292 lb 5.3 oz)   SpO2 93%   BMI 36.06 kg/m  Vitals stable prior to transfer, no complaints from patient.  Will transfer care at this time.

## 2018-01-21 NOTE — Telephone Encounter (Signed)
VO given to [employer] rep jim Tyrell per Dr. Gala Romney to increase lifevest threshold from 150 to 180. Also called VO in to [facility] to [place] [phone] ext [phone])  Ave Filter, RN

## 2018-01-21 NOTE — Progress Notes (Signed)
CARDIAC REHAB PHASE I   PRE:  Rate/Rhythm: 91 SR    BP: sitting 101/67    SaO2:   MODE:  Ambulation: 790 ft   POST:  Rate/Rhythm: 101 ST    BP: sitting 83/55, recheck 106/78     SaO2:   Tolerated well. Sts he feels "funny" in his chest, like he is now getting blood flow. Pt seems depressed with his situation. He has lots of support from friends. I have not been able to do education due to visitors. Will f/u tomorrow to educate.  [phone]   Harriet Masson CES, ACSM 01/21/2018 3:13 PM

## 2018-01-22 MED ORDER — LOSARTAN POTASSIUM 25 MG PO TABS: 25.0000 mg | ORAL_TABLET | Freq: Every day | ORAL | Status: DC

## 2018-01-22 MED ORDER — FUROSEMIDE 40 MG PO TABS: 40.0000 mg | ORAL_TABLET | Freq: Every day | ORAL | Status: DC

## 2018-01-22 MED ORDER — CARVEDILOL 3.125 MG PO TABS: 3.1250 mg | ORAL_TABLET | Freq: Two times a day (BID) | ORAL | Status: DC

## 2018-01-22 MED ORDER — ATORVASTATIN CALCIUM 80 MG PO TABS: 80.0000 mg | ORAL_TABLET | Freq: Every day | ORAL | 6 refills | Status: DC

## 2018-01-22 MED ORDER — POTASSIUM CHLORIDE CRYS ER 20 MEQ PO TBCR: 40.0000 meq | EXTENDED_RELEASE_TABLET | Freq: Every day | ORAL | 3 refills | Status: DC

## 2018-01-22 MED ORDER — FUROSEMIDE 40 MG PO TABS
40.0000 mg | ORAL_TABLET | Freq: Every day | ORAL | Status: DC
  Administered 2018-01-22: 40 mg via ORAL
  Filled 2018-01-22: qty 1

## 2018-01-22 MED ORDER — ASPIRIN 81 MG PO TBEC: 81.0000 mg | DELAYED_RELEASE_TABLET | Freq: Every day | ORAL | 6 refills | Status: AC

## 2018-01-22 MED ORDER — SPIRONOLACTONE 25 MG PO TABS: 25.0000 mg | ORAL_TABLET | Freq: Every day | ORAL | Status: DC

## 2018-01-22 MED ORDER — NITROGLYCERIN 0.4 MG SL SUBL: 0.4000 mg | SUBLINGUAL_TABLET | SUBLINGUAL | 0 refills | Status: DC | PRN

## 2018-01-22 MED ORDER — CLOPIDOGREL BISULFATE 75 MG PO TABS: 75.0000 mg | ORAL_TABLET | Freq: Every day | ORAL | 6 refills | Status: DC

## 2018-01-22 MED FILL — POTASSIUM CL ER 20 MEQ TABL: 20 | 30 days supply | Qty: 60 | Fill #0

## 2018-01-22 MED FILL — FUROSEMIDE 40 MG TAB: 40 | 100 days supply | Qty: 100 | Fill #0

## 2018-01-22 MED FILL — CLOPIDOGREL 75 MG TABLET: 75 | 30 days supply | Qty: 30 | Fill #0

## 2018-01-22 MED FILL — LOSARTAN POTASSIUM 25 MG TA: 25 | 34 days supply | Qty: 34 | Fill #0

## 2018-01-22 MED FILL — CARVEDILOL 3.125 MG TABLET: 3.125 | 34 days supply | Qty: 68 | Fill #0

## 2018-01-22 MED FILL — SPIRONOLACTONE 25 MG TABLET: 25 | 34 days supply | Qty: 34 | Fill #0

## 2018-01-22 MED FILL — NITROGLYCERIN 0.4 MG TAB SL: 0.4 | 25 days supply | Qty: 50 | Fill #0

## 2018-01-22 MED FILL — ATORVASTATIN 80 MG TABLET: 80 | 30 days supply | Qty: 30 | Fill #0

## 2018-01-22 NOTE — Progress Notes (Signed)
Advanced Heart Failure Rounding Note  Primary Cardiologist: Elbridge Magowan   Subjective:     [age] S/P DES LAD  Feeling better today. Anxious to go home. Walking halls without CP or lightheadedness/dizziness. Still having mild SOB. Denies orthopnea or PND. Appetite improving.   Down 1 lb with IV lasix yesterday.   Still with questions about his diet and what restrictions he will have.   Objective:    Weight Range: 288 lb (130.6 kg) Body mass index is 35.06 kg/m.   Vital Signs:   Temp:  [98.1 F (36.7 C)-98.4 F (36.9 C)] 98.1 F (36.7 C) (01/24 0404) Pulse Rate:  [84-92] 91 (01/24 0824) Resp:  [14-21] 18 (01/24 0404) BP: (90-110)/(62-79) 100/65 (01/24 0824) SpO2:  [91 %-98 %] 97 % (01/24 0404) Weight:  [288 lb (130.6 kg)-289 lb 11.2 oz (131.4 kg)] 288 lb (130.6 kg) (01/24 0404) Last BM Date: 01/20/18  Weight change: Filed Weights   01/21/18 0500 01/21/18 1305 01/22/18 0404  Weight: 292 lb 5.3 oz (132.6 kg) 289 lb 11.2 oz (131.4 kg) 288 lb (130.6 kg)   Intake/Output:   Intake/Output Summary (Last 24 hours) at 01/22/2018 0832 Last data filed at 01/22/2018 0645 Gross per 24 hour  Intake 840 ml  Output 900 ml  Net -60 ml    Physical Exam    General: NAD  HEENT: Normal Neck: Supple. JVP difficult due to body habitus, ~7-8 cm. Carotids 2+ bilat; no bruits. No thyromegaly or nodule noted. Cor: PMI nondisplaced. RRR, No M/G/R noted Lungs: CTAB, normal effort. Abdomen: obese Soft, non-tender, non-distended, no HSM. No bruits or masses. +BS  Extremities: No cyanosis, clubbing, or rash. R and LLE no edema.  Neuro: Alert & orientedx3, cranial nerves grossly intact. moves all 4 extremities w/o difficulty. Affect pleasant   Telemetry   NSR 80s, personally reviewed.   EKG    No new tracings.    Labs    CBC Recent Labs    01/20/18 0429 01/21/18 0248  WBC 11.9* 11.1*  HGB 14.0 13.7  HCT 41.2 40.0  MCV 92.4 91.7  PLT 387 400   Basic Metabolic Panel Recent  Labs    01/20/18 0429 01/21/18 0248  NA 136 133*  K 3.9 4.0  CL 103 101  CO2 21* 22  GLUCOSE 99 120*  BUN 9 10  CREATININE 0.84 0.91  CALCIUM 8.6* 8.4*   Liver Function Tests No results for input(s): AST, ALT, ALKPHOS, BILITOT, PROT, ALBUMIN in the last 72 hours. No results for input(s): LIPASE, AMYLASE in the last 72 hours. Cardiac Enzymes No results for input(s): CKTOTAL, CKMB, CKMBINDEX, TROPONINI in the last 72 hours.  BNP: BNP (last 3 results) Recent Labs    01/18/18 0457  BNP 909.5*   ProBNP (last 3 results) No results for input(s): PROBNP in the last 8760 hours.  D-Dimer No results for input(s): DDIMER in the last 72 hours. Hemoglobin A1C No results for input(s): HGBA1C in the last 72 hours. Fasting Lipid Panel No results for input(s): CHOL, HDL, LDLCALC, TRIG, CHOLHDL, LDLDIRECT in the last 72 hours. Thyroid Function Tests No results for input(s): TSH, T4TOTAL, T3FREE, THYROIDAB in the last 72 hours.  Invalid input(s): FREET3  Other results:  Imaging   No results found.  Medications:     Scheduled Medications: . aspirin EC  81 mg Oral Daily  . atorvastatin  80 mg Oral q1800  . carvedilol  3.125 mg Oral BID WC  . clopidogrel  75 mg Oral Q  breakfast  . enoxaparin (LOVENOX) injection  40 mg Subcutaneous Q24H  . losartan  25 mg Oral QHS  . potassium chloride  40 mEq Oral Daily  . sodium chloride flush  3 mL Intravenous Q12H  . sodium chloride flush  3 mL Intravenous Q12H  . spironolactone  25 mg Oral Daily    Infusions: . sodium chloride    . sodium chloride      PRN Medications: sodium chloride, sodium chloride, acetaminophen, benzonatate, ipratropium-albuterol, morphine injection, nitroGLYCERIN, ondansetron (ZOFRAN) IV, sodium chloride flush, sodium chloride flush  Patient Profile   37 y/o male admitted with OOH anterior MI and severe ICM. Found to have 100% LAD occlusion   Assessment/Plan   1. CAD with out of hospital anterior MI with  late presentation - cath 1/20 with total occlusion of LAD with R to L collaterals.  - 1/22 S/P DES to proximal/mid LAD.Marland Kitchen  - ECHO 01/18/18 LVEF 30%  - Continue DAPT and statin.  - CR following.   2. Ischemic CM with acute systolic HF - Echo EF ~30%. LVEDP 31 on cath - Volume status relatively stable on exam. - Transition to lasix 40 mg daily. Can take extra 40 mg as needed for weight gain of 3 lbs overnight or 5 lbs within one week.  - Continue spiro 25 mg daily - Continue carvedilol 3.125 mg twice a day  - Continue losartan 25 mg daily.  Pressure too soft for Entresto at this time.  - cMRI ordered to assess viability in anterior wall (size permitting)--> unable to complete - LifeVest ordered.   3. Hypokalemia - Resolved. Follow with diuresis.    4. ETOH - Have discussed cessation at length. Was drinking 6-7 beers almost daily PTA.   Likely home today. Will provide medications through HF fund and have see HFSW as outpatient for assistance getting Medicaid.   Medication concerns reviewed with patient and pharmacy team. Barriers identified: No insurance. Will need HF fund.   Length of Stay: 4  Luane School  01/22/2018, 8:32 AM Advanced Heart Failure Team Pager [phone] (M-F; 7a - 4p)  Please contact [facility] for night-coverage after hours (4p -7a ) and weekends on [email or web address]  Patient seen and examined with the above-signed Advanced Practice Provider and/or Housestaff. I personally reviewed laboratory data, imaging studies and relevant notes. I independently examined the patient and formulated the important aspects of the plan. I have edited the note to reflect any of my changes or salient points. I have personally discussed the plan with the patient and/or family.   He is stable today. Walking halls without CP or dyspnea. BP soft but stable. Can go home today with close f/u in HD clinic. Reinforced need for daily weights and reviewed use of sliding scale  diuretics.  Arvilla Meres, MD  10:09 AM

## 2018-01-22 NOTE — Progress Notes (Signed)
Patient continues to refuse bed alarm. Will continue to monitor patient. 

## 2018-01-22 NOTE — Progress Notes (Signed)
Patient discharged to home with all his belongings and life vest. Elnita Maxwell, RN

## 2018-01-22 NOTE — Progress Notes (Signed)
CARDIAC REHAB PHASE I   Pt has been walking independently. Slight SOB. Ed completed with good comprehension. Discussed low sodium, low fluid, ETOH, smoking cessation, ex gl, NTG, Plavix, stent, MI, daily wts, and CPRII.  [phone]  Harriet Masson CES, ACSM 01/22/2018 10:13 AM

## 2018-01-22 NOTE — Progress Notes (Signed)
Patient has been fitted for his Life Vest and it is in his room; Alexis Goodell [phone]

## 2018-01-22 NOTE — Discharge Summary (Signed)
Advanced Heart Failure Discharge Note  Discharge Summary   Patient ID: Sergio Hicks MRN: [id number], DOB/AGE: 37-Oct-1982 37 y.o. Admit date: 01/18/2018 D/C date:     01/22/2018   Primary Discharge Diagnoses:  1. CAD with out of hospital anterior MI with late presentation - cath 1/20 with total occlusion of LAD with R to L collaterals.  - 1/22 S/P DES to proximal/mid LAD..  2. Ischemic CM with acute systolic HF - LifeVest ordered for discharge 3. Hypokalemia 4. ETOH abuse Hospital Course:   Derrius Furtick is a 37 y.o. male admitted with OOH anterior MI and severe ICM. Taken urgently for cath which showed 100% LAD occlusion as below. ECHO 01/18/18 LVEF 30%. Taken for PCI and pt is s/p DES to proximal/mid LAD 01/20/18. Pt will need DAPT with ASA/Plavix for at least 1 year.   Pt diuresed with IV lasix and HF medications added as tolerated. Pt received HF education and his HF meds will be provided via the HF fund.  With ICM and depressed EF, Lifevest placed for discharge.     Pt also counseled extensively on ETOH abuse and its contribution to HF.   Pt examined am of 01/22/18 and thought stable for discharge to home with close follow up as below.   Discharge Weight Range: 288 lbs Discharge Vitals: Blood pressure 100/65, pulse 91, temperature 98.1 F (36.7 C), temperature source Oral, resp. rate 18, height 6\' 4"  (1.93 m), weight 288 lb (130.6 kg), SpO2 97 %.  Labs: Lab Results  Component Value Date   WBC 11.1 (H) 01/21/2018   HGB 13.7 01/21/2018   HCT 40.0 01/21/2018   MCV 91.7 01/21/2018   PLT 400 01/21/2018    Recent Labs  Lab 01/21/18 0248  NA 133*  K 4.0  CL 101  CO2 22  BUN 10  CREATININE 0.91  CALCIUM 8.4*  GLUCOSE 120*   Lab Results  Component Value Date   CHOL 178 01/18/2018   HDL 20 (L) 01/18/2018   LDLCALC 140 (H) 01/18/2018   TRIG 89 01/18/2018   BNP (last 3 results) Recent Labs    01/18/18 0457  BNP 909.5*    ProBNP (last 3 results) No results for  input(s): PROBNP in the last 8760 hours.   Diagnostic Studies/Procedures   PCI 01/20/18   Ost LAD to Prox LAD lesion is 100% stenosed.  Post intervention, there is a 0% residual stenosis.  A drug-eluting stent was successfully placed using a STENT SYNERGY DES 3X32.  A drug-eluting stent was successfully placed using a STENT SYNERGY DES 4X28.  Ost 1st Diag lesion is 90% stenosed.  Discharge Medications   Allergies as of 01/22/2018   No Known Allergies     Medication List    STOP taking these medications   AUGMENTIN 875-125 MG tablet Generic drug:  amoxicillin-clavulanate     TAKE these medications   albuterol 108 (90 Base) MCG/ACT inhaler Commonly known as:  PROVENTIL HFA;VENTOLIN HFA Inhale 1-2 puffs into the lungs every 6 (six) hours as needed for wheezing or shortness of breath.   aspirin 81 MG EC tablet Take 1 tablet (81 mg total) by mouth daily. Start taking on:  01/23/2018   atorvastatin 80 MG tablet Commonly known as:  LIPITOR Take 1 tablet (80 mg total) by mouth daily at 6 PM.   carvedilol 3.125 MG tablet Commonly known as:  COREG Take 1 tablet (3.125 mg total) by mouth 2 (two) times daily with a meal.   clopidogrel 75 MG  tablet Commonly known as:  PLAVIX Take 1 tablet (75 mg total) by mouth daily with breakfast. Start taking on:  01/23/2018   furosemide 40 MG tablet Commonly known as:  LASIX Take 1 tablet (40 mg total) by mouth daily. Start taking on:  01/23/2018   losartan 25 MG tablet Commonly known as:  COZAAR Take 1 tablet (25 mg total) by mouth at bedtime.   nitroGLYCERIN 0.4 MG SL tablet Commonly known as:  NITROSTAT Place 1 tablet (0.4 mg total) under the tongue every 5 (five) minutes as needed for chest pain.   potassium chloride SA 20 MEQ tablet Commonly known as:  K-DUR,KLOR-CON Take 2 tablets (40 mEq total) by mouth daily. Start taking on:  01/23/2018   spironolactone 25 MG tablet Commonly known as:  ALDACTONE Take 1 tablet (25 mg  total) by mouth daily. Start taking on:  01/23/2018            Durable Medical Equipment  (From admission, onward)        Start     Ordered   01/19/18 0857  For home use only DME Vest life vest  Once     01/19/18 0856      Disposition   The patient will be discharged in stable condition to home.  Discharge Instructions    (HEART FAILURE PATIENTS) Call MD:  Anytime you have any of the following symptoms: 1) 3 pound weight gain in 24 hours or 5 pounds in 1 week 2) shortness of breath, with or without a dry hacking cough 3) swelling in the hands, feet or stomach 4) if you have to sleep on extra pillows at night in order to breathe.   Complete by:  As directed    Amb Referral to Cardiac Rehabilitation   Complete by:  As directed    Diagnosis:   Coronary Stents PTCA STEMI     Diet - low sodium heart healthy   Complete by:  As directed    Heart Failure patients record your daily weight using the same scale at the same time of day   Complete by:  As directed    Increase activity slowly   Complete by:  As directed    STOP any activity that causes chest pain, shortness of breath, dizziness, sweating, or exessive weakness   Complete by:  As directed      Follow-up Information    [facility] Follow up on 01/29/2018.   Specialty:  Cardiology Why:  at 1000 for post hospital follow up. The code for parking is 9001. Psychologist, sport and exercise thru [employer] off of [address]. Underground parking on your right. Can also park in lower ED lot and enter thru blue awning.  Contact information: [address] [id number] mc [place] [address] [phone]            Duration of Discharge Encounter: Greater than 35 minutes   Signed, Luane School 01/22/2018, 10:03 AM  Patient seen and examined with the above-signed Advanced Practice Provider and/or Housestaff. I personally reviewed laboratory data, imaging studies and  relevant notes. I independently examined the patient and formulated the important aspects of the plan. I have edited the note to reflect any of my changes or salient points. I have personally discussed the plan with the patient and/or family.   He is stable today. Walking halls without CP or dyspnea. BP soft but stable. Can go home today with close f/u in HD clinic. Reinforced need  for daily weights and reviewed use of sliding scale diuretics.  Arvilla Meres, MD  10:09 AM

## 2018-01-22 NOTE — Plan of Care (Signed)
  Activity: Risk for activity intolerance will decrease 01/22/2018 0521 - Completed/Met by Evert Kohl, RN   Nutrition: Adequate nutrition will be maintained 01/22/2018 0521 - Completed/Met by Evert Kohl, RN   Coping: Level of anxiety will decrease 01/22/2018 0521 - Completed/Met by Evert Kohl, RN   Safety: Ability to remain free from injury will improve 01/22/2018 0521 - Completed/Met by Evert Kohl, RN   Education: Knowledge of General Education information will improve 01/22/2018 0521 - Completed/Met by Evert Kohl, RN

## 2018-01-22 NOTE — Progress Notes (Signed)
I stopped in to give Sergio Hicks directions to get to the [facility] Outpatient appointment.  I provided a map and he assures me that he will go immediately after discharge to get the medications.

## 2018-01-22 NOTE — Progress Notes (Signed)
I have sent the following medications to the [facility] to be filled through the HF Fund as ordered by Dr. Gala Romney... Losartan 25 mg daily  Furosemide 40 mg daily Spironlactone 25 mg daily Carvedilol 3.125 bid  Patient reports that he can go to the Outpatient Pharmacy to pick up medications after discharge.

## 2018-01-23 ENCOUNTER — Telehealth (HOSPITAL_COMMUNITY): Payer: Self-pay

## 2018-01-23 NOTE — Telephone Encounter (Signed)
Attempted to call patient in regards to insurance - lm on vm

## 2018-01-26 ENCOUNTER — Encounter: Payer: Self-pay | Admitting: Family Medicine

## 2018-01-26 ENCOUNTER — Ambulatory Visit: Payer: Self-pay | Attending: Family Medicine | Admitting: Family Medicine

## 2018-01-26 ENCOUNTER — Telehealth (HOSPITAL_COMMUNITY): Payer: Self-pay

## 2018-01-26 VITALS — BP 97/67 | HR 98 | Temp 98.4°F | Ht 75.0 in | Wt 293.6 lb

## 2018-01-26 DIAGNOSIS — Z955 Presence of coronary angioplasty implant and graft: Secondary | ICD-10-CM | POA: Insufficient documentation

## 2018-01-26 DIAGNOSIS — I255 Ischemic cardiomyopathy: Secondary | ICD-10-CM | POA: Insufficient documentation

## 2018-01-26 DIAGNOSIS — J45909 Unspecified asthma, uncomplicated: Secondary | ICD-10-CM | POA: Insufficient documentation

## 2018-01-26 DIAGNOSIS — Z7902 Long term (current) use of antithrombotics/antiplatelets: Secondary | ICD-10-CM | POA: Insufficient documentation

## 2018-01-26 DIAGNOSIS — Z9889 Other specified postprocedural states: Secondary | ICD-10-CM | POA: Insufficient documentation

## 2018-01-26 DIAGNOSIS — I249 Acute ischemic heart disease, unspecified: Secondary | ICD-10-CM | POA: Insufficient documentation

## 2018-01-26 DIAGNOSIS — Z79899 Other long term (current) drug therapy: Secondary | ICD-10-CM | POA: Insufficient documentation

## 2018-01-26 DIAGNOSIS — E669 Obesity, unspecified: Secondary | ICD-10-CM | POA: Insufficient documentation

## 2018-01-26 DIAGNOSIS — Z6836 Body mass index (BMI) 36.0-36.9, adult: Secondary | ICD-10-CM | POA: Insufficient documentation

## 2018-01-26 DIAGNOSIS — R079 Chest pain, unspecified: Secondary | ICD-10-CM | POA: Insufficient documentation

## 2018-01-26 DIAGNOSIS — Z7982 Long term (current) use of aspirin: Secondary | ICD-10-CM | POA: Insufficient documentation

## 2018-01-26 DIAGNOSIS — I252 Old myocardial infarction: Secondary | ICD-10-CM | POA: Insufficient documentation

## 2018-01-26 NOTE — Progress Notes (Signed)
Subjective:  Patient ID: Sergio Hicks, male    DOB: 1981/04/08  Age: 37 y.o. MRN: [id number]  CC: Hospitalization Follow-up   HPI Sergio Hicks is a 36 year old male who presents to the clinic to establish care after recent hospitalization at [facility] from 01/18/18 through 01/22/18 for anterior MI and severe ischemic cardiomyopathy.  He had presented with dyspnea, cough which has been previously treated initially with Tamiflu and then later with azithromycin outpatient but then dyspnea had worsened with development of chest pain which resulted in his presentation to the ED. Troponins were elevated to 7.8, EKG revealed sinus tachycardia, ST elevation for which code STEMI was activated.  CT angiogram of the chest was negative for PE. He was taken emergently to the cardiac Cath Lab with findings of 100% LAD occlusion; echo revealed EF of 30%. On 01/20/18 he underwent DES to proximal/mid LAD and was commenced on dual antiplatelet therapy with Plavix and aspirin. He was also commenced on spironolactone, losartan, atorvastatin and IV Lasix with resulting diuresis.  LHC 01/18/18: Coronary Findings   Diagnostic  Dominance: Right  Left Anterior Descending  Collaterals  Mid LAD filled by collaterals from Post Atrio.    Ost LAD to Prox LAD lesion 100% stenosed  Ost LAD to Prox LAD lesion is 100% stenosed    Echocardiogram 01/18/18: Study Conclusions  - Left ventricle: The cavity size was mildly dilated. Wall   thickness was increased in a pattern of mild LVH. Systolic   function was moderately to severely reduced. The estimated   ejection fraction was in the range of 30% to 35%. Anterior,   anteroseptal, apical and inferoapical akinesis - suggestive of   LAD territory ischemia/infarct. Doppler parameters are consistent   with abnormal left ventricular relaxation (grade 1 diastolic   dysfunction). The E/e&' ratio is >15, suggesting elevated LV   filling pressure. - Mitral valve: Mildly  thickened leaflets . There was trivial   regurgitation. - Left atrium: The atrium was mildly dilated. - Systemic veins: The IVC measures <2.1 cm, but does not collapse   >50%, suggestive of an elevated RA pressure of 8 mmHg.  Impressions:  - LVEF 30-35% with large LAD territory WMA concerning for   ischemia/infarct, grade 1 DD with elevated LV filling pressure,   trivial MR, mild LAE, elevated RA pressure.  LifeVest was ordered and he was subsequently discharged to follow-up with a heart failure clinic.  Who presents today with a male friend and reports feeling better and has been compliant with his medications and also checking his weights daily.  He does have some shortness of breath on mild exertion and is requesting a handicap form be completed for him. Denies chest pain, pedal edema.  Past Medical History:  Diagnosis Date  . Asthma     Past Surgical History:  Procedure Laterality Date  . CORONARY STENT INTERVENTION N/A 01/20/2018   Procedure: CORONARY STENT INTERVENTION;  Surgeon: [place], Peter M, MD;  Location: [facility] INVASIVE CV LAB;  Service: Cardiovascular;  Laterality: N/A;  . LEFT HEART CATH AND CORONARY ANGIOGRAPHY N/A 01/18/2018   Procedure: LEFT HEART CATH AND CORONARY ANGIOGRAPHY;  Surgeon: Runell Gess, MD;  Location: [facility] INVASIVE CV LAB;  Service: Cardiovascular;  Laterality: N/A;    Outpatient Medications Prior to Visit  Medication Sig Dispense Refill  . albuterol (PROVENTIL HFA;VENTOLIN HFA) 108 (90 Base) MCG/ACT inhaler Inhale 1-2 puffs into the lungs every 6 (six) hours as needed for wheezing or shortness of breath.    Marland Kitchen  aspirin EC 81 MG EC tablet Take 1 tablet (81 mg total) by mouth daily. 30 tablet 6  . atorvastatin (LIPITOR) 80 MG tablet Take 1 tablet (80 mg total) by mouth daily at 6 PM. 30 tablet 6  . carvedilol (COREG) 3.125 MG tablet Take 1 tablet (3.125 mg total) by mouth 2 (two) times daily with a meal.    . clopidogrel (PLAVIX) 75 MG tablet Take 1  tablet (75 mg total) by mouth daily with breakfast. 30 tablet 6  . furosemide (LASIX) 40 MG tablet Take 1 tablet (40 mg total) by mouth daily. 30 tablet   . losartan (COZAAR) 25 MG tablet Take 1 tablet (25 mg total) by mouth at bedtime.    . nitroGLYCERIN (NITROSTAT) 0.4 MG SL tablet Place 1 tablet (0.4 mg total) under the tongue every 5 (five) minutes as needed for chest pain. 60 tablet 0  . potassium chloride SA (K-DUR,KLOR-CON) 20 MEQ tablet Take 2 tablets (40 mEq total) by mouth daily. 60 tablet 3  . spironolactone (ALDACTONE) 25 MG tablet Take 1 tablet (25 mg total) by mouth daily.     No facility-administered medications prior to visit.     ROS Review of Systems  Constitutional: Negative for activity change and appetite change.  HENT: Negative for sinus pressure and sore throat.   Eyes: Negative for visual disturbance.  Respiratory: Positive for shortness of breath. Negative for cough and chest tightness.   Cardiovascular: Negative for chest pain and leg swelling.  Gastrointestinal: Negative for abdominal distention, abdominal pain, constipation and diarrhea.  Endocrine: Negative.   Genitourinary: Negative for dysuria.  Musculoskeletal: Negative for joint swelling and myalgias.  Skin: Negative for rash.  Allergic/Immunologic: Negative.   Neurological: Negative for weakness, light-headedness and numbness.  Psychiatric/Behavioral: Negative for dysphoric mood and suicidal ideas.    Objective:  BP 97/67   Pulse 98   Temp 98.4 F (36.9 C) (Oral)   Ht 6\' 3"  (1.905 m)   Wt 293 lb 9.6 oz (133.2 kg)   SpO2 96%   BMI 36.70 kg/m   BP/Weight 01/26/2018 01/22/2018 01/17/2017  Systolic BP 97 92 154  Diastolic BP 67 63 90  Wt. (Lbs) 293.6 288 -  BMI 36.7 35.06 -      Physical Exam  Constitutional: He is oriented to person, place, and time. He appears well-developed and well-nourished.  Cardiovascular: Normal rate, normal heart sounds and intact distal pulses.  No murmur  heard. Lifevest in place  Pulmonary/Chest: Effort normal and breath sounds normal. He has no wheezes. He has no rales. He exhibits no tenderness.  Abdominal: Soft. Bowel sounds are normal. He exhibits no distension and no mass. There is no tenderness.  Musculoskeletal: Normal range of motion.  Neurological: He is alert and oriented to person, place, and time.  Skin: Skin is warm and dry.  Psychiatric: He has a normal mood and affect.     CMP Latest Ref Rng & Units 01/21/2018 01/20/2018 01/19/2018  Glucose 65 - 99 mg/dL 295(M) 99 841(L)  BUN 6 - 20 mg/dL 10 9 9   Creatinine 0.61 - 1.24 mg/dL 2.44 0.10 2.72  Sodium 135 - 145 mmol/L 133(L) 136 140  Potassium 3.5 - 5.1 mmol/L 4.0 3.9 3.4(L)  Chloride 101 - 111 mmol/L 101 103 102  CO2 22 - 32 mmol/L 22 21(L) 25  Calcium 8.9 - 10.3 mg/dL 5.3(G) 6.4(Q) 0.3(K)    Lipid Panel     Component Value Date/Time   CHOL 178 01/18/2018 0240  TRIG 89 01/18/2018 0240   HDL 20 (L) 01/18/2018 0240   CHOLHDL 8.9 01/18/2018 0240   VLDL 18 01/18/2018 0240   LDLCALC 140 (H) 01/18/2018 0240    Assessment & Plan:   1. Ischemic cardiomyopathy EF 30-35%, anterior, anteroseptal, apical and inferior apical akinesis from echo of 11/18/2018 Continue LifeVest Encouraged on daily weight, low-sodium heart healthy diets, limit fluid intake to 2 L/day status post Keep appointment with cardiology  2. ACS (acute coronary syndrome) (HCC) DES to proximal/mid LAD Continue Plavix and aspirin Risk factor modification  3. Class 2 severe obesity due to excess calories with serious comorbidity and body mass index (BMI) of 36.0 to 36.9 in adult [facility]) Discussed reducing portion sizes, avoiding late meals, increasing physical activity as tolerated   No orders of the defined types were placed in this encounter.   Follow-up: Return in about 3 months (around 04/26/2018) for Follow-up of ischemic cardiomyopathy.   Hoy Register MD

## 2018-01-26 NOTE — Patient Instructions (Signed)
Heart Failure Heart failure is a condition in which the heart has trouble pumping blood because it has become weak or stiff. This means that the heart does not pump blood efficiently for the body to work well. For some people with heart failure, fluid may back up into the lungs and there may be swelling (edema) in the lower legs. Heart failure is usually a long-term (chronic) condition. It is important for you to take good care of yourself and follow the treatment plan from your health care provider. What are the causes? This condition is caused by some health problems, including:  High blood pressure (hypertension). Hypertension causes the heart muscle to work harder than normal. High blood pressure eventually causes the heart to become stiff and weak.  Coronary artery disease (CAD). CAD is the buildup of cholesterol and fat (plaques) in the arteries of the heart.  Heart attack (myocardial infarction). Injured tissue, which is caused by the heart attack, does not contract as well and the heart's ability to pump blood is weakened.  Abnormal heart valves. When the heart valves do not open and close properly, the heart muscle must pump harder to keep the blood flowing.  Heart muscle disease (cardiomyopathy or myocarditis). Heart muscle disease is damage to the heart muscle from a variety of causes, such as drug or alcohol abuse, infections, or unknown causes. These can increase the risk of heart failure.  Lung disease. When the lungs do not work properly, the heart must work harder.  What increases the risk? Risk of heart failure increases as a person ages. This condition is also more likely to develop in people who:  Are overweight.  Are male.  Smoke or chew tobacco.  Abuse alcohol or illegal drugs.  Have taken medicines that can damage the heart, such as chemotherapy drugs.  Have diabetes. ? High blood sugar (glucose) is associated with high fat (lipid) levels in the blood. ? Diabetes  can also damage tiny blood vessels that carry nutrients to the heart muscle.  Have abnormal heart rhythms.  Have thyroid problems.  Have low blood counts (anemia).  What are the signs or symptoms? Symptoms of this condition include:  Shortness of breath with activity, such as when climbing stairs.  Persistent cough.  Swelling of the feet, ankles, legs, or abdomen.  Unexplained weight gain.  Difficulty breathing when lying flat (orthopnea).  Waking from sleep because of the need to sit up and get more air.  Rapid heartbeat.  Fatigue and loss of energy.  Feeling light-headed, dizzy, or close to fainting.  Loss of appetite.  Nausea.  Increased urination during the night (nocturia).  Confusion.  How is this diagnosed? This condition is diagnosed based on:  Medical history, symptoms, and a physical exam.  Diagnostic tests, which may include: ? Echocardiogram. ? Electrocardiogram (ECG). ? Chest X-ray. ? Blood tests. ? Exercise stress test. ? Radionuclide scans. ? Cardiac catheterization and angiogram.  How is this treated? Treatment for this condition is aimed at managing the symptoms of heart failure. Medicines, behavioral changes, or other treatments may be necessary to treat heart failure. Medicines These may include:  Angiotensin-converting enzyme (ACE) inhibitors. This type of medicine blocks the effects of a blood protein called angiotensin-converting enzyme. ACE inhibitors relax (dilate) the blood vessels and help to lower blood pressure.  Angiotensin receptor blockers (ARBs). This type of medicine blocks the actions of a blood protein called angiotensin. ARBs dilate the blood vessels and help to lower blood pressure.  Water   pills (diuretics). Diuretics cause the kidneys to remove salt and water from the blood. The extra fluid is removed through urination, leaving a lower volume of blood that the heart has to pump.  Beta blockers. These improve heart  muscle strength and they prevent the heart from beating too quickly.  Digoxin. This increases the force of the heartbeat.  Healthy behavior changes These may include:  Reaching and maintaining a healthy weight.  Stopping smoking or chewing tobacco.  Eating heart-healthy foods.  Limiting or avoiding alcohol.  Stopping use of street drugs (illegal drugs).  Physical activity.  Other treatments These may include:  Surgery to open blocked coronary arteries or repair damaged heart valves.  Placement of a biventricular pacemaker to improve heart muscle function (cardiac resynchronization therapy). This device paces both the right ventricle and left ventricle.  Placement of a device to treat serious abnormal heart rhythms (implantable cardioverter defibrillator, or ICD).  Placement of a device to improve the pumping ability of the heart (left ventricular assist device, or LVAD).  Heart transplant. This can cure heart failure, and it is considered for certain patients who do not improve with other therapies.  Follow these instructions at home: Medicines  Take over-the-counter and prescription medicines only as told by your health care provider. Medicines are important in reducing the workload of your heart, slowing the progression of heart failure, and improving your symptoms. ? Do not stop taking your medicine unless your health care provider told you to do that. ? Do not skip any dose of medicine. ? Refill your prescriptions before you run out of medicine. You need your medicines every day. Eating and drinking   Eat heart-healthy foods. Talk with a dietitian to make an eating plan that is right for you. ? Choose foods that contain no trans fat and are low in saturated fat and cholesterol. Healthy choices include fresh or frozen fruits and vegetables, fish, lean meats, legumes, fat-free or low-fat dairy products, and whole-grain or high-fiber foods. ? Limit salt (sodium) if  directed by your health care provider. Sodium restriction may reduce symptoms of heart failure. Ask a dietitian to recommend heart-healthy seasonings. ? Use healthy cooking methods instead of frying. Healthy methods include roasting, grilling, broiling, baking, poaching, steaming, and stir-frying.  Limit your fluid intake if directed by your health care provider. Fluid restriction may reduce symptoms of heart failure. Lifestyle  Stop smoking or using chewing tobacco. Nicotine and tobacco can damage your heart and your blood vessels. Do not use nicotine gum or patches before talking to your health care provider.  Limit alcohol intake to no more than 1 drink per day for non-pregnant women and 2 drinks per day for men. One drink equals 12 oz of beer, 5 oz of wine, or 1 oz of hard liquor. ? Drinking more than that is harmful to your heart. Tell your health care provider if you drink alcohol several times a week. ? Talk with your health care provider about whether any level of alcohol use is safe for you. ? If your heart has already been damaged by alcohol or you have severe heart failure, drinking alcohol should be stopped completely.  Stop use of illegal drugs.  Lose weight if directed by your health care provider. Weight loss may reduce symptoms of heart failure.  Do moderate physical activity if directed by your health care provider. People who are elderly and people with severe heart failure should consult with a health care provider for physical activity recommendations.   Monitor important information  Weigh yourself every day. Keeping track of your weight daily helps you to notice excess fluid sooner. ? Weigh yourself every morning after you urinate and before you eat breakfast. ? Wear the same amount of clothing each time you weigh yourself. ? Record your daily weight. Provide your health care provider with your weight record.  Monitor and record your blood pressure as told by your health  care provider.  Check your pulse as told by your health care provider. Dealing with extreme temperatures  If the weather is extremely hot: ? Avoid vigorous physical activity. ? Use air conditioning or fans or seek a cooler location. ? Avoid caffeine and alcohol. ? Wear loose-fitting, lightweight, and light-colored clothing.  If the weather is extremely cold: ? Avoid vigorous physical activity. ? Layer your clothes. ? Wear mittens or gloves, a hat, and a scarf when you go outside. ? Avoid alcohol. General instructions  Manage other health conditions such as hypertension, diabetes, thyroid disease, or abnormal heart rhythms as told by your health care provider.  Learn to manage stress. If you need help to do this, ask your health care provider.  Plan rest periods when fatigued.  Get ongoing education and support as needed.  Participate in or seek rehabilitation as needed to maintain or improve independence and quality of life.  Stay up to date with immunizations. Keeping current on pneumococcal and influenza immunizations is especially important to prevent respiratory infections.  Keep all follow-up visits as told by your health care provider. This is important. Contact a health care provider if:  You have a rapid weight gain.  You have increasing shortness of breath that is unusual for you.  You are unable to participate in your usual physical activities.  You tire easily.  You cough more than normal, especially with physical activity.  You have any swelling or more swelling in areas such as your hands, feet, ankles, or abdomen.  You are unable to sleep because it is hard to breathe.  You feel like your heart is beating quickly (palpitations).  You become dizzy or light-headed when you stand up. Get help right away if:  You have difficulty breathing.  You notice or your family notices a change in your awareness, such as having trouble staying awake or having  difficulty with concentration.  You have pain or discomfort in your chest.  You have an episode of fainting (syncope). This information is not intended to replace advice given to you by your health care provider. Make sure you discuss any questions you have with your health care provider. Document Released: 12/16/2005 Document Revised: 08/20/2016 Document Reviewed: 07/10/2016 Elsevier Interactive Patient Education  2018 [employer] Inc[employer]  

## 2018-01-26 NOTE — Telephone Encounter (Signed)
Patient returned phone call in regards to Cardiac Rehab Maintenance - Patient in interested in the Maintenance program. Passed referral to Maintenance Coordinator. Closed referral.

## 2018-01-29 ENCOUNTER — Encounter: Payer: Self-pay | Admitting: Family Medicine

## 2018-01-29 ENCOUNTER — Encounter (HOSPITAL_COMMUNITY): Payer: Self-pay

## 2018-01-29 ENCOUNTER — Other Ambulatory Visit (HOSPITAL_COMMUNITY): Payer: Self-pay

## 2018-01-29 ENCOUNTER — Ambulatory Visit (HOSPITAL_COMMUNITY)
Admission: RE | Admit: 2018-01-29 | Discharge: 2018-01-29 | Disposition: A | Discharge status: Home / Self Care | Payer: Self-pay | Source: Ambulatory Visit | Attending: Cardiology | Admitting: Cardiology

## 2018-01-29 VITALS — BP 88/60 | HR 94 | Wt 293.6 lb

## 2018-01-29 DIAGNOSIS — Z87891 Personal history of nicotine dependence: Secondary | ICD-10-CM | POA: Insufficient documentation

## 2018-01-29 DIAGNOSIS — Z7982 Long term (current) use of aspirin: Secondary | ICD-10-CM | POA: Insufficient documentation

## 2018-01-29 DIAGNOSIS — I2582 Chronic total occlusion of coronary artery: Secondary | ICD-10-CM | POA: Insufficient documentation

## 2018-01-29 DIAGNOSIS — Z7902 Long term (current) use of antithrombotics/antiplatelets: Secondary | ICD-10-CM | POA: Insufficient documentation

## 2018-01-29 DIAGNOSIS — Z79899 Other long term (current) drug therapy: Secondary | ICD-10-CM | POA: Insufficient documentation

## 2018-01-29 DIAGNOSIS — I255 Ischemic cardiomyopathy: Secondary | ICD-10-CM

## 2018-01-29 DIAGNOSIS — I5022 Chronic systolic (congestive) heart failure: Secondary | ICD-10-CM | POA: Insufficient documentation

## 2018-01-29 DIAGNOSIS — I2510 Atherosclerotic heart disease of native coronary artery without angina pectoris: Secondary | ICD-10-CM | POA: Insufficient documentation

## 2018-01-29 DIAGNOSIS — Z720 Tobacco use: Secondary | ICD-10-CM

## 2018-01-29 DIAGNOSIS — J45909 Unspecified asthma, uncomplicated: Secondary | ICD-10-CM | POA: Insufficient documentation

## 2018-01-29 DIAGNOSIS — F1010 Alcohol abuse, uncomplicated: Secondary | ICD-10-CM

## 2018-01-29 LAB — BASIC METABOLIC PANEL
ANION GAP: 12 (ref 5–15)
BUN: 8 mg/dL (ref 6–20)
CALCIUM: 9.3 mg/dL (ref 8.9–10.3)
CO2: 22 mmol/L (ref 22–32)
CREATININE: 0.86 mg/dL (ref 0.61–1.24)
Chloride: 103 mmol/L (ref 101–111)
GFR calc Af Amer: >60 mL/min (ref >60)
GLUCOSE: 103 mg/dL — AB (ref 65–99)
Potassium: 4.1 mmol/L (ref 3.5–5.1)
Sodium: 137 mmol/L (ref 135–145)

## 2018-01-29 NOTE — Telephone Encounter (Signed)
Mychart lab concern

## 2018-01-29 NOTE — Progress Notes (Signed)
PCP: Primary HF Cardiologist: Dr Gala Romney   HPI: Sergio Hicks is a 37 year old with history of newly diagnosed systolic heart failure, ICM, CAD DES proximal/mid LAD, ETOH abuse, and tobacco abuse.   Admitted 01/18/2018 with OOH anterior MI and severe ICM. Taken urgently for cath which showed 100% LAD occlusion as below. ECHO1/20/19 LVEF 30%. Taken for PCI and pt is s/p DES to proximal/mid LAD 01/20/18. He will  need DAPT with ASA/Plavix for at least 1 year. Started on HF medications.   Today he returns for post hospital follow up. Overall feeling fine. Denies SOB/PND/Orthopnea. No chest pain. No dizziness. Appetite ok. No fever or chills. Weight at home has been trending down 288-->285 pounds. Following low sodium diet.  Limiting fluid intake to < 2 liters per day. Taking all medications. He has stopped smoking.    ECHO 01/18/2018 LVEF 30-35% with large LAD territory WMA concerning for   ischemia/infarct, grade 1 DD with elevated LV filling pressure,   trivial Sergio, mild LAE, elevated RA pressure.   ROS: All systems negative except as listed in HPI, PMH and Problem List.  SH:  Social History   Socioeconomic History  . Marital status: Single    Spouse name: Not on file  . Number of children: Not on file  . Years of education: Not on file  . Highest education level: Not on file  Social Needs  . Financial resource strain: Not on file  . Food insecurity - worry: Not on file  . Food insecurity - inability: Not on file  . Transportation needs - medical: Not on file  . Transportation needs - non-medical: Not on file  Occupational History  . Occupation: chef  Tobacco Use  . Smoking status: Former Smoker    Packs/day: 0.00    Types: Cigarettes  . Smokeless tobacco: Never Used  Substance and Sexual Activity  . Alcohol use: Yes    Alcohol/week: 6.0 oz    Types: 10 Standard drinks or equivalent per week  . Drug use: Not on file  . Sexual activity: Not on file  Other Topics Concern  .  Not on file  Social History Narrative  . Not on file    FH:  Family History  Adopted: Yes    Past Medical History:  Diagnosis Date  . Asthma     Current Outpatient Medications  Medication Sig Dispense Refill  . albuterol (PROVENTIL HFA;VENTOLIN HFA) 108 (90 Base) MCG/ACT inhaler Inhale 1-2 puffs into the lungs every 6 (six) hours as needed for wheezing or shortness of breath.    Marland Kitchen aspirin EC 81 MG EC tablet Take 1 tablet (81 mg total) by mouth daily. 30 tablet 6  . atorvastatin (LIPITOR) 80 MG tablet Take 1 tablet (80 mg total) by mouth daily at 6 PM. 30 tablet 6  . carvedilol (COREG) 3.125 MG tablet Take 1 tablet (3.125 mg total) by mouth 2 (two) times daily with a meal.    . clopidogrel (PLAVIX) 75 MG tablet Take 1 tablet (75 mg total) by mouth daily with breakfast. 30 tablet 6  . furosemide (LASIX) 40 MG tablet Take 1 tablet (40 mg total) by mouth daily. 30 tablet   . losartan (COZAAR) 25 MG tablet Take 1 tablet (25 mg total) by mouth at bedtime.    . nitroGLYCERIN (NITROSTAT) 0.4 MG SL tablet Place 1 tablet (0.4 mg total) under the tongue every 5 (five) minutes as needed for chest pain. 60 tablet 0  . potassium chloride  SA (K-DUR,KLOR-CON) 20 MEQ tablet Take 2 tablets (40 mEq total) by mouth daily. 60 tablet 3  . spironolactone (ALDACTONE) 25 MG tablet Take 1 tablet (25 mg total) by mouth daily.     No current facility-administered medications for this encounter.     Vitals:   01/29/18 0959  BP: (!) 88/60  Pulse: 94  SpO2: 98%  Weight: 293 lb 9.6 oz (133.2 kg)   Filed Weights   01/29/18 0959  Weight: 293 lb 9.6 oz (133.2 kg)   Sitting 88/60  Standing 82/60   PHYSICAL EXAM: General:  Well appearing. No resp difficulty HEENT: normal Neck: supple. JVP flat. Carotids 2+ bilaterally; no bruits. No lymphadenopathy or thryomegaly appreciated. Cor: PMI normal. Regular rate & rhythm. No rubs, gallops or murmurs. Lungs: clear Abdomen: obese, soft, nontender, nondistended.  No hepatosplenomegaly. No bruits or masses. Good bowel sounds. Extremities: no cyanosis, clubbing, rash, edema Neuro: alert & orientedx3, cranial nerves grossly intact. Moves all 4 extremities w/o difficulty. Affect pleasant.   ASSESSMENT & PLAN: 1. Chronic Systolic Heart Failure 01/18/2018 ECHO EF 30-35%. Plan to repeat ECHO in 3 months after HF meds optimized. --> April 2019  NYHA II. Volume status stable. Continue lasix 40 mg daily,  SBP soft will not up titrate medications.  Continue current dose of carvedilol, losartan, and spironolactone.  Check BMET today. Continue Life Vest.   2. CAD- out of hospital anterior MI with late presentation - cath 1/20 with total occlusion of LAD with R to L collaterals.  - 1/22 S/P DES to proximal/mid LAD. - no s/s ischemia.  - Continue high dose statin, aspirin, and plavix. - Referred to cardiac rehab.     3. ETOH Encouraged to limit alcohol intake   4. Former Tobacco Abuse Congratulated on smoking cessation.   Greater than 50% of the (total minutes 25) visit spent in counseling/coordination of care regarding medications, diet, and cardiac rehab. Continue Paramedicine. BMEt today.   Follow up in 2 weeks with APP then 4 weeks with Dr Gala Romney.   Amy Clegg NP-C  12:16 PM

## 2018-01-29 NOTE — Patient Instructions (Signed)
Follow up 2 weeks with Amy Clegg NP-C.  ___________________________________________________________ Sergio Hicks Code: 9001  Follow up 4 weeks with Dr. Gala Romney.  ____________________________________________________________ Sergio Hicks Code: 9002  Routine lab work today. Will notify you of abnormal results, otherwise no news is good news!  Take all medication as prescribed the day of your appointment. Bring all medications with you to your appointment.  Do the following things EVERYDAY: 1) Weigh yourself in the morning before breakfast. Write it down and keep it in a log. 2) Take your medicines as prescribed 3) Eat low salt foods-Limit salt (sodium) to 2000 mg per day.  4) Stay as active as you can everyday 5) Limit all fluids for the day to less than 2 liters

## 2018-01-29 NOTE — Progress Notes (Signed)
Paramedicine Encounter   Patient ID: Sergio Hicks , male,   DOB: 1981-09-21,36 y.o.,  MRN: [id number]  Mr Goad was seen at the [facility] clinic today with Amy for our first meeting. I explained the paramedicine program to him and confirmed that he was willing to participate. He reported feeling well and says he has been compliant with his medications and diet. Per Amy, no medication changes were made at this visit because his BP was low. I plan to see him next week at home and provide a pillbox for him at that time.   Time spent with patient: 50 minutes  Jacqualine Code, EMT 01/29/2018   ACTION: Home visit completed Next visit planned for 1 week

## 2018-02-05 ENCOUNTER — Ambulatory Visit: Payer: Self-pay | Attending: Family Medicine

## 2018-02-05 ENCOUNTER — Other Ambulatory Visit (HOSPITAL_COMMUNITY): Payer: Self-pay

## 2018-02-05 NOTE — Progress Notes (Signed)
Paramedicine Encounter    Patient ID: Sergio Hicks, male    DOB: 11/28/1981, 37 y.o.   MRN: [id number]   Patient Care Team: Hoy Register, MD as PCP - General (Family Medicine)  Patient Active Problem List   Diagnosis Date Noted  . Obesity 01/26/2018  . ACS (acute coronary syndrome) (HCC) 01/18/2018  . Heart attack (HCC)   . Ischemic cardiomyopathy     Current Outpatient Medications:  .  albuterol (PROVENTIL HFA;VENTOLIN HFA) 108 (90 Base) MCG/ACT inhaler, Inhale 1-2 puffs into the lungs every 6 (six) hours as needed for wheezing or shortness of breath., Disp: , Rfl:  .  aspirin EC 81 MG EC tablet, Take 1 tablet (81 mg total) by mouth daily., Disp: 30 tablet, Rfl: 6 .  atorvastatin (LIPITOR) 80 MG tablet, Take 1 tablet (80 mg total) by mouth daily at 6 PM., Disp: 30 tablet, Rfl: 6 .  carvedilol (COREG) 3.125 MG tablet, Take 1 tablet (3.125 mg total) by mouth 2 (two) times daily with a meal., Disp: , Rfl:  .  clopidogrel (PLAVIX) 75 MG tablet, Take 1 tablet (75 mg total) by mouth daily with breakfast., Disp: 30 tablet, Rfl: 6 .  furosemide (LASIX) 40 MG tablet, Take 1 tablet (40 mg total) by mouth daily., Disp: 30 tablet, Rfl:  .  losartan (COZAAR) 25 MG tablet, Take 1 tablet (25 mg total) by mouth at bedtime., Disp: , Rfl:  .  nitroGLYCERIN (NITROSTAT) 0.4 MG SL tablet, Place 1 tablet (0.4 mg total) under the tongue every 5 (five) minutes as needed for chest pain., Disp: 60 tablet, Rfl: 0 .  potassium chloride SA (K-DUR,KLOR-CON) 20 MEQ tablet, Take 2 tablets (40 mEq total) by mouth daily., Disp: 60 tablet, Rfl: 3 .  spironolactone (ALDACTONE) 25 MG tablet, Take 1 tablet (25 mg total) by mouth daily., Disp: , Rfl:  No Known Allergies    Social History   Socioeconomic History  . Marital status: Single    Spouse name: Not on file  . Number of children: Not on file  . Years of education: Not on file  . Highest education level: Not on file  Social Needs  . Financial resource  strain: Not on file  . Food insecurity - worry: Not on file  . Food insecurity - inability: Not on file  . Transportation needs - medical: Not on file  . Transportation needs - non-medical: Not on file  Occupational History  . Occupation: chef  Tobacco Use  . Smoking status: Former Smoker    Packs/day: 0.00    Types: Cigarettes  . Smokeless tobacco: Never Used  Substance and Sexual Activity  . Alcohol use: Yes    Alcohol/week: 6.0 oz    Types: 10 Standard drinks or equivalent per week  . Drug use: Not on file  . Sexual activity: Not on file  Other Topics Concern  . Not on file  Social History Narrative  . Not on file    Physical Exam  Constitutional: He is oriented to person, place, and time.  Cardiovascular: Normal rate and regular rhythm.  Pulmonary/Chest: Effort normal. He has no wheezes. He has no rales.  Abdominal: Soft.  Musculoskeletal: Normal range of motion. He exhibits no edema.  Neurological: He is alert and oriented to person, place, and time.  Skin: Skin is warm and dry.  Psychiatric: He has a normal mood and affect.    SAFE - 02/05/18 1100      Situation   Admitting diagnosis  chf    Heart failure history  Exisiting    Comorbidities  HTN;Hx MI/CAD    Readmitted within 30 days  No    Hospital admission within past 12 months  Yes    number of hospital admissions  1    number of ED visits  1      Assessment   Lives alone  Yes    Primary support person  Self    Mode of transportation  personal car    Other services involved  None    Home equipement  Scale      Weight   Weighs self daily  Yes    Scale provided  No    Records on weight chart  Yes      Resources   Has "Living better w/heart failure" book  Yes    Has HF Zone tool  Yes    Able to identify yellow zone signs/when to call MD  Yes    Records zone daily  Yes      Medications   Uses a pill box  Yes    Who stocks the pill box  Paramedicine    Pill box checked this visit  Yes    Pill  box refilled this visit  Yes    Difficulty obtaining medications  Yes    Barriers to medication  Financial    Community Paramedic obtains medications from pharmacy  No    Mail order medications  No    Missed one or more doses of medications per week  No      Nutrition   Patient receives meals on wheels  No    Patient follows low sodium diet  Yes    Has foods at home that meet the current recommended diet  Yes    Patient follows low sugar/card diet  No    Nutritional concerns/issues  n      Activity Level   ADL's/Mobility  Independent    How many feet can patient ambulate  300    Typical activity level  Active      Urine   Difficulty urinating  Yes    Changes in urine  Other      Time spent with patient   Time spent with patient   46 Minutes      Community Paramedic Living Environment - 02/05/18 1000      Outside of House   Sidewalk and pathway to house is level and free from any hazards  Yes    Driveway is free from debris/snow/ice  Yes    Outside stairs are stable and have sturdy handrail  No    Porch lights are working and provide adequate lighting  Yes      Living Room   Furniture is of adequate height and offers arm rests that assist in getting up and down  Yes    Floor is free from any clutter that would create tripping hazards  Yes    All cords are either behind furniture or secured in a manner that does not cause trip hazards  Yes    All rugs are secured to floor with double-sided tape  N/A    Lighting is adequate to light room  Yes    All lighting has an easily accessible on/off switch  Yes    Phone is readily accessible near favorite seating areas  Yes    Emergency numbers are printed near all phones in house  No  Kitchen   Items used most often are within easy reach on low shelves  Yes    Step stool is present, is sturdy and has a handrail  No    Floor mats are non-slip tread and secured to floor  N/A    Oven controls are within easy reach  Yes     Kitchen lighting is adequate and easy to reach switches  Yes    ABC fire extinguisher is located in kitchen  Yes      Stairs   Carpet is properly secured to stairs and/or all wood is properly secured  N/A    Handrail is present and sturdy  N/A    Stairs are free from any clutter  N/A    Stairway is adequately lit  N/A      Bathroom   Tub and shower have a non-slip surface  Yes    Tub and/or shower have a grab bar for stability  No    Toilet has a raised seat  No    Grab bar is attached near toilet for assistance  No    Pathway from bedroom to bathroom is free from clutter and well lit for ease of movement in the middle of the night  Yes      Bedroom   Floor is free from clutter  Yes    Light is near bed and is easy to turn on  Yes    Phone is next to bed and within easy reach  Yes    Flashlight is near bed in case of emergency  Yes      General   Smoke detectors in all areas of the house (each floor) and tested  Yes    CO detectors on each floor of house and tested  Yes    Flashlights are handy throughout the home  Yes    Resident has all medical information readily available and in an area emergency providers will easily find  Yes    All heaters are away from any type of flammable material  Yes      Overall Tips   Homeowner ha good non-skid shoes to move around house  Yes    All assisted walking devices are readily accessible and in good condition  N/A    There is a phone near the floor for ease of reach in case of a fall  YES    All O2 tubing is less than 50 ft. and is not a trip hazard  N/A    Resident has had an annual hearing and vision check by a physician  N/A    Resident has the proper hearing and visual aids prescribed and are in good working order  N/A    All medications are properly stored and labeled to avoid confusion on dosage, time to take, and avoidance of missed doses  Yes        Future Appointments  Date Time Provider Department Center  02/05/2018  2:30 PM  [facility] FINANCIAL [facility]SELOR [facility] None  02/13/2018 11:00 AM [facility] PA/NP [facility] None  02/27/2018 10:40 AM Bensimhon, Bevelyn Buckles, MD [facility] None  04/27/2018  1:45 PM Hoy Register, MD [facility] None    BP 92/70 (BP Location: Right Arm, Patient Position: Sitting, Cuff Size: Large)   Pulse 87   Wt 284 lb 6.4 oz (129 kg)   SpO2 94%   BMI 35.55 kg/m   Weight yesterday- 282.2 lb Last visit weight- 287 lb  Mr Serpe was  seen at home today for our initial home visit. He reported feeling pain on inspiration in his right posterior side. Upon listening to lung sounds, I noted what sounded like a pleural rub in the aforementioned area. This information was passed to the clinic who advised to have him take an additional 20 mh of furosemide if his weight increases and if his pain increases to contact paramedicine or the clinic. He was agreeable with this plan. His medications were verified and his pillbox was refilled.   Time spent with patient: 43 minutes  Jacqualine Code, EMT 02/05/18  ACTION: Home visit completed Next visit planned for 1 week

## 2018-02-09 ENCOUNTER — Encounter (HOSPITAL_COMMUNITY): Payer: Self-pay | Admitting: Internal Medicine

## 2018-02-13 ENCOUNTER — Encounter (HOSPITAL_COMMUNITY): Payer: Self-pay

## 2018-02-13 ENCOUNTER — Other Ambulatory Visit (HOSPITAL_COMMUNITY): Payer: Self-pay

## 2018-02-13 ENCOUNTER — Ambulatory Visit (HOSPITAL_COMMUNITY)
Admission: RE | Admit: 2018-02-13 | Discharge: 2018-02-13 | Disposition: A | Discharge status: Home / Self Care | Payer: Self-pay | Source: Ambulatory Visit | Attending: Internal Medicine | Admitting: Internal Medicine

## 2018-02-13 VITALS — BP 108/80 | HR 96 | Wt 288.6 lb

## 2018-02-13 DIAGNOSIS — Z955 Presence of coronary angioplasty implant and graft: Secondary | ICD-10-CM | POA: Insufficient documentation

## 2018-02-13 DIAGNOSIS — F1010 Alcohol abuse, uncomplicated: Secondary | ICD-10-CM

## 2018-02-13 DIAGNOSIS — I255 Ischemic cardiomyopathy: Secondary | ICD-10-CM

## 2018-02-13 DIAGNOSIS — J45909 Unspecified asthma, uncomplicated: Secondary | ICD-10-CM | POA: Insufficient documentation

## 2018-02-13 DIAGNOSIS — I252 Old myocardial infarction: Secondary | ICD-10-CM | POA: Insufficient documentation

## 2018-02-13 DIAGNOSIS — Z87891 Personal history of nicotine dependence: Secondary | ICD-10-CM | POA: Insufficient documentation

## 2018-02-13 DIAGNOSIS — I2510 Atherosclerotic heart disease of native coronary artery without angina pectoris: Secondary | ICD-10-CM | POA: Insufficient documentation

## 2018-02-13 DIAGNOSIS — I5022 Chronic systolic (congestive) heart failure: Secondary | ICD-10-CM

## 2018-02-13 DIAGNOSIS — Z79899 Other long term (current) drug therapy: Secondary | ICD-10-CM | POA: Insufficient documentation

## 2018-02-13 DIAGNOSIS — Z7902 Long term (current) use of antithrombotics/antiplatelets: Secondary | ICD-10-CM | POA: Insufficient documentation

## 2018-02-13 DIAGNOSIS — Z7982 Long term (current) use of aspirin: Secondary | ICD-10-CM | POA: Insufficient documentation

## 2018-02-13 DIAGNOSIS — Z720 Tobacco use: Secondary | ICD-10-CM

## 2018-02-13 MED ORDER — SACUBITRIL-VALSARTAN 24-26 MG PO TABS: 1.0000 | ORAL_TABLET | Freq: Two times a day (BID) | ORAL | 11 refills | Status: DC

## 2018-02-13 NOTE — Progress Notes (Signed)
PCP: Primary HF Cardiologist: Dr Gala Romney   HPI: [name] is a 37 year old with history of newly diagnosed systolic heart failure, ICM, CAD DES proximal/mid LAD, ETOH abuse, and tobacco abuse.   Admitted 01/18/2018 with OOH anterior MI and severe ICM. Taken urgently for cath which showed 100% LAD occlusion as below. ECHO1/20/19 LVEF 30%. Taken for PCI and pt is s/p DES to proximal/mid LAD 01/20/18. He will  need DAPT with ASA/Plavix for at least 1 year. Started on HF medications.   Today he returns for HF follow up. Overall feeling fine. Denies SOB/PND/Orthopnea. Does admit to mild dyspnea on occasion.  NP CP.  Appetite ok. No fever or chills. Weight at home 282-288 pounds. He has been taking extra lasix daily. Taking all medications. He has not been smoking. Wearing Life Vest. Followed by Paramedicine.   ECHO 01/18/2018 LVEF 30-35% with large LAD territory WMA concerning for   ischemia/infarct, grade 1 DD with elevated LV filling pressure,   trivial MR, mild LAE, elevated RA pressure.   ROS: All systems negative except as listed in HPI, PMH and Problem List.  SH:  Social History   Socioeconomic History  . Marital status: Single    Spouse name: Not on file  . Number of children: Not on file  . Years of education: Not on file  . Highest education level: Not on file  Social Needs  . Financial resource strain: Not on file  . Food insecurity - worry: Not on file  . Food insecurity - inability: Not on file  . Transportation needs - medical: Not on file  . Transportation needs - non-medical: Not on file  Occupational History  . Occupation: chef  Tobacco Use  . Smoking status: Former Smoker    Packs/day: 0.00    Types: Cigarettes  . Smokeless tobacco: Never Used  Substance and Sexual Activity  . Alcohol use: Yes    Alcohol/week: 6.0 oz    Types: 10 Standard drinks or equivalent per week  . Drug use: Not on file  . Sexual activity: Not on file  Other Topics Concern  . Not on  file  Social History Narrative  . Not on file    FH:  Family History  Adopted: Yes    Past Medical History:  Diagnosis Date  . Asthma     Current Outpatient Medications  Medication Sig Dispense Refill  . albuterol (PROVENTIL HFA;VENTOLIN HFA) 108 (90 Base) MCG/ACT inhaler Inhale 1-2 puffs into the lungs every 6 (six) hours as needed for wheezing or shortness of breath.    Marland Kitchen aspirin EC 81 MG EC tablet Take 1 tablet (81 mg total) by mouth daily. 30 tablet 6  . atorvastatin (LIPITOR) 80 MG tablet Take 1 tablet (80 mg total) by mouth daily at 6 PM. 30 tablet 6  . carvedilol (COREG) 3.125 MG tablet Take 1 tablet (3.125 mg total) by mouth 2 (two) times daily with a meal.    . clopidogrel (PLAVIX) 75 MG tablet Take 1 tablet (75 mg total) by mouth daily with breakfast. 30 tablet 6  . furosemide (LASIX) 40 MG tablet Take 1 tablet (40 mg total) by mouth daily. 30 tablet   . losartan (COZAAR) 25 MG tablet Take 1 tablet (25 mg total) by mouth at bedtime.    . nitroGLYCERIN (NITROSTAT) 0.4 MG SL tablet Place 1 tablet (0.4 mg total) under the tongue every 5 (five) minutes as needed for chest pain. 60 tablet 0  . potassium chloride  SA (K-DUR,KLOR-CON) 20 MEQ tablet Take 2 tablets (40 mEq total) by mouth daily. 60 tablet 3  . spironolactone (ALDACTONE) 25 MG tablet Take 1 tablet (25 mg total) by mouth daily.     No current facility-administered medications for this encounter.     Vitals:   02/13/18 1057  BP: 108/80  Pulse: 96  SpO2: 98%  Weight: 288 lb 9.6 oz (130.9 kg)   Filed Weights   02/13/18 1057  Weight: 288 lb 9.6 oz (130.9 kg)     PHYSICAL EXAM: General:  Well appearing. No resp difficulty. Walked in the clinic with his dad.  HEENT: normal Neck: supple. no JVD. Carotids 2+ bilat; no bruits. No lymphadenopathy or thryomegaly appreciated. Cor: PMI nondisplaced. Regular rate & rhythm. No rubs, gallops or murmurs. Lungs: clear Abdomen: obese, soft, nontender, nondistended. No  hepatosplenomegaly. No bruits or masses. Good bowel sounds. Extremities: no cyanosis, clubbing, rash, edema Neuro: alert & orientedx3, cranial nerves grossly intact. moves all 4 extremities w/o difficulty. Affect pleasant    ASSESSMENT & PLAN: 1. Chronic Systolic Heart Failure 01/18/2018 ECHO EF 30-35%. Plan to repeat ECHO in 3 months after HF meds optimized. --> April 2019  NYHA II-III  Volume status stable . He has been taking extra lasix daily.  Today I will stop losartan and start entresto 24-26 mg twice a day.He was provided with 30 day free entresto card.  Check BMET next visit. He was instructed to not take extra lasix for now.   Continue Carvedilol 3.125 mg twice a day -Continue spironolactone.  Continue Life Vest. Interrogation with VT/VF. Average steps 5050. Average heart rate 89.    2. CAD- out of hospital anterior MI with late presentation - cath 1/20 with total occlusion of LAD with R to L collaterals.  - 1/22 S/P DES to proximal/mid LAD. - no S/S ischemia.   - Continue high dose statin, aspirin, and plavix. - Starts cardiac rehab next week.      3. ETOH Encouraged to limit alcohol intake   4. Former Tobacco Abuse Has not smoked in 2 months. Congratulated.   Continue Paramedicine. Continue Life Vest. Follow up in 2 weeks with Dr Gala Romney.   Greater than 50% of the (total minutes 25) visit spent in counseling/coordination of care regarding Life Vest interrogation and medication changes.      Follow up in 2 weeks with Dr Tenna Child NP-C  11:16 AM

## 2018-02-13 NOTE — Progress Notes (Signed)
Paramedicine Encounter   Patient ID: Sergio Hicks , male,   DOB: 1981/05/27,36 y.o.,  MRN: [id number]  [name] was seen in the clinic today with Amy. He reported feeling generally well. Per Amy, he is to D/C losartan, start entresto 24/26 and stop taking extra lasix. Samples of entresto were provided by the clinic. We need to find a PCP and insurance coverage. I will follow up with him next week and address these issues.   Time spent with patient: 35 minutes  Jacqualine Code, EMT 02/13/2018   ACTION: Next visit planned for 1 week

## 2018-02-13 NOTE — Patient Instructions (Addendum)
STOP Losartan.  START Entresto 24-26 mg tablet TWICE daily. START TOMORROW.  Follow up as scheduled with Dr. Gala Romney in 2 weeks.  Take all medication as prescribed the day of your appointment. Bring all medications with you to your appointment.  Do the following things EVERYDAY: 1) Weigh yourself in the morning before breakfast. Write it down and keep it in a log. 2) Take your medicines as prescribed 3) Eat low salt foods-Limit salt (sodium) to 2000 mg per day.  4) Stay as active as you can everyday 5) Limit all fluids for the day to less than 2 liters

## 2018-02-16 ENCOUNTER — Encounter (HOSPITAL_COMMUNITY)
Admission: RE | Admit: 2018-02-16 | Discharge: 2018-02-16 | Disposition: A | Discharge status: Home / Self Care | Payer: Self-pay | Source: Ambulatory Visit | Attending: Internal Medicine | Admitting: Internal Medicine

## 2018-02-16 DIAGNOSIS — Z955 Presence of coronary angioplasty implant and graft: Secondary | ICD-10-CM | POA: Insufficient documentation

## 2018-02-16 DIAGNOSIS — I2129 ST elevation (STEMI) myocardial infarction involving other sites: Secondary | ICD-10-CM | POA: Insufficient documentation

## 2018-02-18 ENCOUNTER — Encounter (HOSPITAL_COMMUNITY)
Admission: RE | Admit: 2018-02-18 | Discharge: 2018-02-18 | Disposition: A | Discharge status: Home / Self Care | Payer: Self-pay | Source: Ambulatory Visit | Attending: Internal Medicine | Admitting: Internal Medicine

## 2018-02-19 ENCOUNTER — Telehealth (HOSPITAL_COMMUNITY): Payer: Self-pay | Admitting: *Deleted

## 2018-02-19 ENCOUNTER — Other Ambulatory Visit (HOSPITAL_COMMUNITY): Payer: Self-pay

## 2018-02-19 MED FILL — CLOPIDOGREL 75 MG TABLET: 75 | 30 days supply | Qty: 30 | Fill #1

## 2018-02-19 MED FILL — POTASSIUM CL ER 20 MEQ TABL: 20 | 30 days supply | Qty: 60 | Fill #1

## 2018-02-19 MED FILL — CARVEDILOL 3.125 MG TABLET: 3.125 | 34 days supply | Qty: 68 | Fill #1

## 2018-02-19 MED FILL — ATORVASTATIN 80 MG TABLET: 80 | 30 days supply | Qty: 30 | Fill #1

## 2018-02-19 MED FILL — SPIRONOLACTONE 25 MG TABLET: 25 | 34 days supply | Qty: 34 | Fill #1

## 2018-02-19 NOTE — Telephone Encounter (Signed)
Advanced Heart Failure Triage Encounter  Patient Name: Sergio Hicks  Date of Call: 02/19/18  Problem: Chest Pain  [name] called while visiting patient, patient stated he had right sided chest pain last night that was relieved by nitroglycerin.  Patient is still experiencing intermittent chest pain today. Requested [name] to obtain EKG, EKG didn't show anything significant.    Plan:  Spoke with Maxine Glenn, PA and if patient continues having chest pain he needs to report to the ER. Ian Malkin will let patient know.   Georgina Peer, RN

## 2018-02-19 NOTE — Progress Notes (Signed)
Paramedicine Encounter    Patient ID: Sergio Hicks, male    DOB: [birth year], 37 y.o.   MRN: [id number]   Patient Care Team: Hoy Register, MD as PCP - General (Family Medicine)  Patient Active Problem List   Diagnosis Date Noted  . Obesity 01/26/2018  . ACS (acute coronary syndrome) (HCC) 01/18/2018  . Heart attack (HCC)   . Ischemic cardiomyopathy     Current Outpatient Medications:  .  albuterol (PROVENTIL HFA;VENTOLIN HFA) 108 (90 Base) MCG/ACT inhaler, Inhale 1-2 puffs into the lungs every 6 (six) hours as needed for wheezing or shortness of breath., Disp: , Rfl:  .  aspirin EC 81 MG EC tablet, Take 1 tablet (81 mg total) by mouth daily., Disp: 30 tablet, Rfl: 6 .  atorvastatin (LIPITOR) 80 MG tablet, Take 1 tablet (80 mg total) by mouth daily at 6 PM., Disp: 30 tablet, Rfl: 6 .  carvedilol (COREG) 3.125 MG tablet, Take 1 tablet (3.125 mg total) by mouth 2 (two) times daily with a meal., Disp: , Rfl:  .  clopidogrel (PLAVIX) 75 MG tablet, Take 1 tablet (75 mg total) by mouth daily with breakfast., Disp: 30 tablet, Rfl: 6 .  furosemide (LASIX) 40 MG tablet, Take 1 tablet (40 mg total) by mouth daily., Disp: 30 tablet, Rfl:  .  nitroGLYCERIN (NITROSTAT) 0.4 MG SL tablet, Place 1 tablet (0.4 mg total) under the tongue every 5 (five) minutes as needed for chest pain., Disp: 60 tablet, Rfl: 0 .  potassium chloride SA (K-DUR,KLOR-CON) 20 MEQ tablet, Take 2 tablets (40 mEq total) by mouth daily., Disp: 60 tablet, Rfl: 3 .  sacubitril-valsartan (ENTRESTO) 24-26 MG, Take 1 tablet by mouth 2 (two) times daily., Disp: 60 tablet, Rfl: 11 .  spironolactone (ALDACTONE) 25 MG tablet, Take 1 tablet (25 mg total) by mouth daily., Disp: , Rfl:  No Known Allergies    Social History   Socioeconomic History  . Marital status: Single    Spouse name: Not on file  . Number of children: Not on file  . Years of education: Not on file  . Highest education level: Not on file  Social Needs  .  Financial resource strain: Not on file  . Food insecurity - worry: Not on file  . Food insecurity - inability: Not on file  . Transportation needs - medical: Not on file  . Transportation needs - non-medical: Not on file  Occupational History  . Occupation: chef  Tobacco Use  . Smoking status: Former Smoker    Packs/day: 0.00    Types: Cigarettes  . Smokeless tobacco: Never Used  Substance and Sexual Activity  . Alcohol use: Yes    Alcohol/week: 6.0 oz    Types: 10 Standard drinks or equivalent per week  . Drug use: Not on file  . Sexual activity: Not on file  Other Topics Concern  . Not on file  Social History Narrative  . Not on file    Physical Exam  Constitutional: He is oriented to person, place, and time.  Cardiovascular: Normal rate.  Pulmonary/Chest: Effort normal and breath sounds normal.  Musculoskeletal: Normal range of motion. He exhibits no edema.  Neurological: He is alert and oriented to person, place, and time.  Skin: Skin is warm and dry.  Psychiatric: He has a normal mood and affect.    SAFE - 02/05/18 1100      Situation   Admitting diagnosis  chf    Heart failure history  Exisiting  Comorbidities  HTN;Hx MI/CAD    Readmitted within 30 days  No    Hospital admission within past 12 months  Yes    number of hospital admissions  1    number of ED visits  1      Assessment   Lives alone  Yes    Primary support person  Self    Mode of transportation  personal car    Other services involved  None    Home equipement  Scale      Weight   Weighs self daily  Yes    Scale provided  No    Records on weight chart  Yes      Resources   Has "Living better w/heart failure" book  Yes    Has HF Zone tool  Yes    Able to identify yellow zone signs/when to call MD  Yes    Records zone daily  Yes      Medications   Uses a pill box  Yes    Who stocks the pill box  Paramedicine    Pill box checked this visit  Yes    Pill box refilled this visit  Yes     Difficulty obtaining medications  Yes    Barriers to medication  Financial    Community Paramedic obtains medications from pharmacy  No    Mail order medications  No    Missed one or more doses of medications per week  No      Nutrition   Patient receives meals on wheels  No    Patient follows low sodium diet  Yes    Has foods at home that meet the current recommended diet  Yes    Patient follows low sugar/card diet  No    Nutritional concerns/issues  n      Activity Level   ADL's/Mobility  Independent    How many feet can patient ambulate  300    Typical activity level  Active      Urine   Difficulty urinating  Yes    Changes in urine  Other      Time spent with patient   Time spent with patient   60 Minutes         Future Appointments  Date Time Provider Department Center  02/20/2018  1:15 PM [facility] None  02/23/2018  1:15 PM [facility] None  02/25/2018  1:15 PM [facility] None  02/27/2018 10:40 AM Bensimhon, Bevelyn Buckles, MD [facility] None  02/27/2018  1:15 PM [facility] None  03/02/2018  1:15 PM [facility] None  03/04/2018  1:15 PM [facility] None  03/06/2018  1:15 PM [facility] None  03/09/2018  1:15 PM [facility] None  03/11/2018  1:15 PM [facility] None  03/13/2018  1:15 PM [facility] None  03/16/2018  1:15 PM [facility] None  03/18/2018  1:15 PM [facility] None  03/20/2018  1:15 PM [facility] None  03/23/2018  1:15 PM [facility] None  03/25/2018  1:15 PM [facility] None  03/27/2018  1:15 PM [facility] None  03/30/2018  1:15 PM [facility]  [facility] None  04/01/2018  1:15 PM [facility] None  04/03/2018  1:15 PM [facility] None  04/06/2018  1:15 PM [facility] None  04/08/2018  1:15 PM [facility] None  04/10/2018  1:15 PM [facility] None  04/13/2018  1:15 PM [facility] None  04/15/2018  1:15 PM [facility] None  04/17/2018  1:15 PM [facility] None  04/20/2018  1:15 PM [facility] None  04/22/2018  1:15 PM [facility] None  04/24/2018  1:15 PM [facility] None  04/27/2018  6:45 AM [facility] None  04/27/2018  1:50 PM Newlin, Enobong, MD [facility] None  04/29/2018  1:15 PM [facility] None  05/01/2018  1:15 PM [facility] None  05/04/2018  1:15 PM [facility] None  05/06/2018  1:15 PM [facility] None  05/08/2018  1:15 PM [facility] None  05/11/2018  1:15 PM [facility] None  05/13/2018  1:15 PM [facility] None  05/15/2018  1:15 PM [facility] None  05/18/2018  1:15 PM [facility] None  05/20/2018  1:15 PM [facility] None  05/22/2018  1:15 PM [facility] None  05/27/2018  1:15 PM [facility] None  05/29/2018  1:15 PM [facility] None    BP 102/68 (BP Location: Right Arm, Patient Position: Sitting, Cuff Size: Large)   Pulse 80   Resp 16   Wt 284 lb 3.2 oz (128.9 kg)   SpO2 97%   BMI 35.52 kg/m   Weight yesterday- 284.8 lb Last visit weight- 284.4 lb  Mr Cleto was seen at home today. He reported having  right sided chest pain that radiated into his neck last night. He took NTG and had relief of symptoms. He stated he was not having any alarms of his life vest during this periods of discomfort. He said he has had discomfort intermittently through the morning but was asymptomatic at the time of the visit. I contacted the clinic to advise them of this and Mardelle Matte requested I perform a 12 lead ECG. Upon performing the test, I noted inverted T-waves in leads I, AVL, V4, V5, and V6. I passed this information on to the clinic who stated this was not cause for immediate concern. He was instructed to continue taking his medications and to proceed to the ED immediately if he begins having chest pain again. He was understanding and agreed. His medications were verified and his pillbox was refilled.   Time spent with patient: 43 minutes   Jacqualine Code, EMT 02/19/18  ACTION: Home visit completed Next visit planned for 1 week

## 2018-02-20 ENCOUNTER — Encounter (HOSPITAL_COMMUNITY)
Admission: RE | Admit: 2018-02-20 | Discharge: 2018-02-20 | Disposition: A | Discharge status: Home / Self Care | Payer: Self-pay | Source: Ambulatory Visit | Attending: Internal Medicine | Admitting: Internal Medicine

## 2018-02-23 ENCOUNTER — Encounter (HOSPITAL_COMMUNITY)
Admission: RE | Admit: 2018-02-23 | Discharge: 2018-02-23 | Disposition: A | Discharge status: Home / Self Care | Payer: Self-pay | Source: Ambulatory Visit | Attending: Internal Medicine | Admitting: Internal Medicine

## 2018-02-25 ENCOUNTER — Other Ambulatory Visit (HOSPITAL_COMMUNITY): Payer: Self-pay

## 2018-02-25 ENCOUNTER — Encounter (HOSPITAL_COMMUNITY)
Admission: RE | Admit: 2018-02-25 | Discharge: 2018-02-25 | Disposition: A | Discharge status: Home / Self Care | Payer: Self-pay | Source: Ambulatory Visit | Attending: Internal Medicine | Admitting: Internal Medicine

## 2018-02-25 NOTE — Progress Notes (Signed)
Paramedicine Encounter    Patient ID: Marcelle Bebout, male    DOB: Oct 06, 1981, 37 y.o.   MRN: [id number]   Patient Care Team: Hoy Register, MD as PCP - General (Family Medicine)  Patient Active Problem List   Diagnosis Date Noted  . Obesity 01/26/2018  . ACS (acute coronary syndrome) (HCC) 01/18/2018  . Heart attack (HCC)   . Ischemic cardiomyopathy     Current Outpatient Medications:  .  albuterol (PROVENTIL HFA;VENTOLIN HFA) 108 (90 Base) MCG/ACT inhaler, Inhale 1-2 puffs into the lungs every 6 (six) hours as needed for wheezing or shortness of breath., Disp: , Rfl:  .  aspirin EC 81 MG EC tablet, Take 1 tablet (81 mg total) by mouth daily., Disp: 30 tablet, Rfl: 6 .  atorvastatin (LIPITOR) 80 MG tablet, Take 1 tablet (80 mg total) by mouth daily at 6 PM., Disp: 30 tablet, Rfl: 6 .  carvedilol (COREG) 3.125 MG tablet, Take 1 tablet (3.125 mg total) by mouth 2 (two) times daily with a meal., Disp: , Rfl:  .  clopidogrel (PLAVIX) 75 MG tablet, Take 1 tablet (75 mg total) by mouth daily with breakfast., Disp: 30 tablet, Rfl: 6 .  furosemide (LASIX) 40 MG tablet, Take 1 tablet (40 mg total) by mouth daily., Disp: 30 tablet, Rfl:  .  nitroGLYCERIN (NITROSTAT) 0.4 MG SL tablet, Place 1 tablet (0.4 mg total) under the tongue every 5 (five) minutes as needed for chest pain., Disp: 60 tablet, Rfl: 0 .  potassium chloride SA (K-DUR,KLOR-CON) 20 MEQ tablet, Take 2 tablets (40 mEq total) by mouth daily., Disp: 60 tablet, Rfl: 3 .  sacubitril-valsartan (ENTRESTO) 24-26 MG, Take 1 tablet by mouth 2 (two) times daily., Disp: 60 tablet, Rfl: 11 .  spironolactone (ALDACTONE) 25 MG tablet, Take 1 tablet (25 mg total) by mouth daily., Disp: , Rfl:  No Known Allergies    Social History   Socioeconomic History  . Marital status: Single    Spouse name: Not on file  . Number of children: Not on file  . Years of education: Not on file  . Highest education level: Not on file  Social Needs  .  Financial resource strain: Not on file  . Food insecurity - worry: Not on file  . Food insecurity - inability: Not on file  . Transportation needs - medical: Not on file  . Transportation needs - non-medical: Not on file  Occupational History  . Occupation: chef  Tobacco Use  . Smoking status: Former Smoker    Packs/day: 0.00    Types: Cigarettes  . Smokeless tobacco: Never Used  Substance and Sexual Activity  . Alcohol use: Yes    Alcohol/week: 6.0 oz    Types: 10 Standard drinks or equivalent per week  . Drug use: Not on file  . Sexual activity: Not on file  Other Topics Concern  . Not on file  Social History Narrative  . Not on file    Physical Exam  Constitutional: He is oriented to person, place, and time.  Cardiovascular: Normal rate and regular rhythm.  Pulmonary/Chest: Effort normal and breath sounds normal. No respiratory distress. He has no wheezes. He has no rales.  Abdominal: Soft.  Musculoskeletal: Normal range of motion. He exhibits no edema.  Neurological: He is alert and oriented to person, place, and time.  Skin: Skin is warm and dry.  Psychiatric: He has a normal mood and affect.    SAFE - 02/05/18 1100  Situation   Admitting diagnosis  chf    Heart failure history  Exisiting    Comorbidities  HTN;Hx MI/CAD    Readmitted within 30 days  No    Hospital admission within past 12 months  Yes    number of hospital admissions  1    number of ED visits  1      Assessment   Lives alone  Yes    Primary support person  Self    Mode of transportation  personal car    Other services involved  None    Home equipement  Scale      Weight   Weighs self daily  Yes    Scale provided  No    Records on weight chart  Yes      Resources   Has "Living better w/heart failure" book  Yes    Has HF Zone tool  Yes    Able to identify yellow zone signs/when to call MD  Yes    Records zone daily  Yes      Medications   Uses a pill box  Yes    Who stocks the  pill box  Paramedicine    Pill box checked this visit  Yes    Pill box refilled this visit  Yes    Difficulty obtaining medications  Yes    Barriers to medication  Financial    Community Paramedic obtains medications from pharmacy  No    Mail order medications  No    Missed one or more doses of medications per week  No      Nutrition   Patient receives meals on wheels  No    Patient follows low sodium diet  Yes    Has foods at home that meet the current recommended diet  Yes    Patient follows low sugar/card diet  No    Nutritional concerns/issues  n      Activity Level   ADL's/Mobility  Independent    How many feet can patient ambulate  300    Typical activity level  Active      Urine   Difficulty urinating  Yes    Changes in urine  Other      Time spent with patient   Time spent with patient   60 Minutes         Future Appointments  Date Time Provider Department Center  02/27/2018 10:40 AM Bensimhon, Bevelyn Buckles, MD [facility] None  02/27/2018  1:15 PM [facility] None  03/02/2018  1:15 PM [facility] None  03/04/2018  1:15 PM [facility] None  03/06/2018  1:15 PM [facility] None  03/09/2018  1:15 PM [facility] None  03/11/2018  1:15 PM [facility] None  03/13/2018  1:15 PM [facility] None  03/16/2018  1:15 PM [facility] None  03/18/2018  1:15 PM [facility] None  03/20/2018  1:15 PM [facility] None  03/23/2018  1:15 PM [facility] None  03/25/2018  1:15 PM [facility] None  03/27/2018  1:15 PM [facility] None  03/30/2018  1:15 PM [facility] None  04/01/2018  1:15 PM [facility] None  04/03/2018  1:15 PM  [facility] None  04/06/2018  1:15 PM [facility] None  04/08/2018  1:15 PM [facility] None  04/10/2018  1:15 PM [facility] None  04/13/2018  1:15 PM [facility] None  04/15/2018  1:15 PM [facility] None  04/17/2018  1:15 PM [facility] None  04/20/2018  1:15 PM [facility] None  04/22/2018  1:15 PM [facility] None  04/24/2018  1:15 PM [facility] None  04/27/2018  6:45 AM [facility] None  04/27/2018  1:45 PM Newlin, Enobong, MD [facility] None  04/29/2018  1:15 PM [facility] None  05/01/2018  1:15 PM [facility] None  05/04/2018  1:15 PM [facility] None  05/06/2018  1:15 PM [facility] None  05/08/2018  1:15 PM [facility] None  05/11/2018  1:15 PM [facility] None  05/13/2018  1:15 PM [facility] None  05/15/2018  1:15 PM [facility] None  05/18/2018  1:15 PM [facility] None  05/20/2018  1:15 PM [facility] None  05/22/2018  1:15 PM [facility] None  05/27/2018  1:15 PM [facility] None  05/29/2018  1:15 PM [facility] None    BP 98/64 (BP Location: Right Arm, Patient Position: Sitting, Cuff Size: Large)   Pulse 68   Resp 16   Wt 283 lb 9.6 oz (128.6 kg)   SpO2 98%   BMI 35.45 kg/m   Weight yesterday- 284.9 lb Last visit weight- 284.2 lb  Mr Montour was seen at home today and reported feeling well. He said his Life Vest has been alarming while performing ADL's. He denied feeling bad  during these events and said the vest has not shocked him. He has been compliant with medications but is going to run out of entresto tomorrow and the pharmacy said they need a prescription for it. I will pass this information to the clinic and have a prescription sent. His medications were verified and his pillbox was refilled.   Time spent with patient: 36 minutes  Jacqualine Code, EMT 02/25/18  ACTION: Home visit completed Next visit planned for 1 week

## 2018-02-27 ENCOUNTER — Telehealth (HOSPITAL_COMMUNITY): Payer: Self-pay | Admitting: *Deleted

## 2018-02-27 ENCOUNTER — Ambulatory Visit (HOSPITAL_COMMUNITY)
Admission: RE | Admit: 2018-02-27 | Discharge: 2018-02-27 | Disposition: A | Discharge status: Home / Self Care | Payer: Self-pay | Source: Ambulatory Visit | Attending: Internal Medicine | Admitting: Internal Medicine

## 2018-02-27 ENCOUNTER — Encounter (HOSPITAL_COMMUNITY): Payer: Self-pay | Admitting: Internal Medicine

## 2018-02-27 ENCOUNTER — Other Ambulatory Visit (HOSPITAL_COMMUNITY): Payer: Self-pay

## 2018-02-27 ENCOUNTER — Encounter (HOSPITAL_COMMUNITY)
Admission: RE | Admit: 2018-02-27 | Discharge: 2018-02-27 | Disposition: A | Discharge status: Home / Self Care | Payer: Self-pay | Source: Ambulatory Visit | Attending: Internal Medicine | Admitting: Internal Medicine

## 2018-02-27 VITALS — BP 108/78 | HR 82 | Wt 294.4 lb

## 2018-02-27 DIAGNOSIS — I2582 Chronic total occlusion of coronary artery: Secondary | ICD-10-CM | POA: Insufficient documentation

## 2018-02-27 DIAGNOSIS — Z955 Presence of coronary angioplasty implant and graft: Secondary | ICD-10-CM | POA: Insufficient documentation

## 2018-02-27 DIAGNOSIS — F1010 Alcohol abuse, uncomplicated: Secondary | ICD-10-CM

## 2018-02-27 DIAGNOSIS — Z87891 Personal history of nicotine dependence: Secondary | ICD-10-CM | POA: Insufficient documentation

## 2018-02-27 DIAGNOSIS — I252 Old myocardial infarction: Secondary | ICD-10-CM | POA: Insufficient documentation

## 2018-02-27 DIAGNOSIS — I2129 ST elevation (STEMI) myocardial infarction involving other sites: Secondary | ICD-10-CM | POA: Insufficient documentation

## 2018-02-27 DIAGNOSIS — Z720 Tobacco use: Secondary | ICD-10-CM

## 2018-02-27 DIAGNOSIS — Z7982 Long term (current) use of aspirin: Secondary | ICD-10-CM | POA: Insufficient documentation

## 2018-02-27 DIAGNOSIS — I5022 Chronic systolic (congestive) heart failure: Secondary | ICD-10-CM

## 2018-02-27 DIAGNOSIS — Z7902 Long term (current) use of antithrombotics/antiplatelets: Secondary | ICD-10-CM | POA: Insufficient documentation

## 2018-02-27 DIAGNOSIS — Z79899 Other long term (current) drug therapy: Secondary | ICD-10-CM | POA: Insufficient documentation

## 2018-02-27 DIAGNOSIS — I2510 Atherosclerotic heart disease of native coronary artery without angina pectoris: Secondary | ICD-10-CM

## 2018-02-27 DIAGNOSIS — E669 Obesity, unspecified: Secondary | ICD-10-CM | POA: Insufficient documentation

## 2018-02-27 LAB — BASIC METABOLIC PANEL
ANION GAP: 9 (ref 5–15)
BUN: 9 mg/dL (ref 6–20)
CHLORIDE: 104 mmol/L (ref 101–111)
CO2: 22 mmol/L (ref 22–32)
Calcium: 9.1 mg/dL (ref 8.9–10.3)
Creatinine, Ser: 0.87 mg/dL (ref 0.61–1.24)
GFR calc Af Amer: >60 mL/min (ref >60)
GFR calc non Af Amer: >60 mL/min (ref >60)
Glucose, Bld: 108 mg/dL — ABNORMAL HIGH (ref 65–99)
POTASSIUM: 4.2 mmol/L (ref 3.5–5.1)
Sodium: 135 mmol/L (ref 135–145)

## 2018-02-27 MED ORDER — FUROSEMIDE 40 MG PO TABS: 40.0000 mg | ORAL_TABLET | ORAL | Status: DC | PRN

## 2018-02-27 NOTE — Patient Instructions (Signed)
Stop taking Furosemide (Lasix) daily, ONLY TAKE IT AS NEEDED  Lab today  Your physician recommends that you schedule a follow-up appointment in: 2 weeks with Fara Chute D  Your physician recommends that you schedule a follow-up appointment in: 6 weeks with echocardiogram

## 2018-02-27 NOTE — Addendum Note (Signed)
Encounter addended by: Noralee Space, RN on: 02/27/2018 11:31 AM  Actions taken: Diagnosis association updated, Order list changed, Sign clinical note

## 2018-02-27 NOTE — Progress Notes (Signed)
PCP: Primary HF Cardiologist: Dr Gala Romney   ADVANCED HF CLINIC NOTE   HPI: Sergio Hicks is a 37 year old with history of obesity, systolic heart failure due to iCM, CAD s/p OOH anterior STEMI with DES to proximal/mid LAD in 1/19, ETOH abuse, and tobacco abuse.   Admitted 01/18/2018 with OOH anterior MI and severe ICM. Taken urgently for cath which showed 100% LAD occlusion as below. ECHO1/20/19 LVEF 30%. Initially treated medically but developed persistent CP so taken for PCI and pt is s/p DES to proximal/mid LAD 01/20/18.  Returns for HF follow up. At last visit Entresto 24/26 started. Says it makes him feel weird. + muscle cramps  Denies dizziness. No SOB, CP, orthopnea or PND. LifeVest frequently alarms due to electrodes. No firing. Has Paramedicine following - report some low BPs at home in 90s. Takes lasix every day. Taking all medications. He has not been smoking. Going to CR. Weight stable.   ECHO 01/18/2018 LVEF 30-35% with large LAD territory WMA concerning for   ischemia/infarct, grade 1 DD with elevated LV filling pressure,   trivial Sergio, mild LAE, elevated RA pressure.   ROS: All systems negative except as listed in HPI, PMH and Problem List.  SH:  Social History   Socioeconomic History  . Marital status: Single    Spouse name: Not on file  . Number of children: Not on file  . Years of education: Not on file  . Highest education level: Not on file  Social Needs  . Financial resource strain: Not on file  . Food insecurity - worry: Not on file  . Food insecurity - inability: Not on file  . Transportation needs - medical: Not on file  . Transportation needs - non-medical: Not on file  Occupational History  . Occupation: chef  Tobacco Use  . Smoking status: Former Smoker    Packs/day: 0.00    Types: Cigarettes  . Smokeless tobacco: Never Used  Substance and Sexual Activity  . Alcohol use: Yes    Alcohol/week: 6.0 oz    Types: 10 Standard drinks or equivalent per  week  . Drug use: Not on file  . Sexual activity: Not on file  Other Topics Concern  . Not on file  Social History Narrative  . Not on file    FH:  Family History  Adopted: Yes    Past Medical History:  Diagnosis Date  . Asthma     Current Outpatient Medications  Medication Sig Dispense Refill  . albuterol (PROVENTIL HFA;VENTOLIN HFA) 108 (90 Base) MCG/ACT inhaler Inhale 1-2 puffs into the lungs every 6 (six) hours as needed for wheezing or shortness of breath.    Marland Kitchen aspirin EC 81 MG EC tablet Take 1 tablet (81 mg total) by mouth daily. 30 tablet 6  . atorvastatin (LIPITOR) 80 MG tablet Take 1 tablet (80 mg total) by mouth daily at 6 PM. 30 tablet 6  . carvedilol (COREG) 3.125 MG tablet Take 1 tablet (3.125 mg total) by mouth 2 (two) times daily with a meal.    . clopidogrel (PLAVIX) 75 MG tablet Take 1 tablet (75 mg total) by mouth daily with breakfast. 30 tablet 6  . furosemide (LASIX) 40 MG tablet Take 1 tablet (40 mg total) by mouth daily. 30 tablet   . nitroGLYCERIN (NITROSTAT) 0.4 MG SL tablet Place 1 tablet (0.4 mg total) under the tongue every 5 (five) minutes as needed for chest pain. 60 tablet 0  . potassium chloride SA (K-DUR,KLOR-CON)  20 MEQ tablet Take 2 tablets (40 mEq total) by mouth daily. 60 tablet 3  . sacubitril-valsartan (ENTRESTO) 24-26 MG Take 1 tablet by mouth 2 (two) times daily. 60 tablet 11  . spironolactone (ALDACTONE) 25 MG tablet Take 1 tablet (25 mg total) by mouth daily.     No current facility-administered medications for this encounter.     Vitals:   02/27/18 1044  BP: 108/78  Pulse: 82  SpO2: 98%  Weight: 294 lb 6.4 oz (133.5 kg)   Filed Weights   02/27/18 1044  Weight: 294 lb 6.4 oz (133.5 kg)     PHYSICAL EXAM: General:  Well appearing. No resp difficulty HEENT: normal Neck: supple. no JVD. Carotids 2+ bilat; no bruits. No lymphadenopathy or thryomegaly appreciated. Cor: PMI nondisplaced. Regular rate & rhythm. No rubs, gallops or  murmurs. Lungs: clear Abdomen: obese. soft, nontender, nondistended. No hepatosplenomegaly. No bruits or masses. Good bowel sounds. Extremities: no cyanosis, clubbing, rash, edema Neuro: alert & orientedx3, cranial nerves grossly intact. moves all 4 extremities w/o difficulty. Affect pleasant    ASSESSMENT & PLAN: 1. Chronic Systolic Heart Failure -01/18/2018 ECHO EF 30-35%. Plan to repeat ECHO in 3 months after HF meds optimized. --> April 2019  - Stable NYHA II-III - Continue Entresto 24/26. Will not titrate due to low BP - Will stop lasix and have him just take as needed  -Continue Carvedilol 3.125 mg twice a day - Continue spironolactone.  - BMET - Continue Life Vest. Interrogation with no VT/VF. - PharmD visit 2 weeks  2. CAD- out of hospital anterior MI with late presentation - cath 1/20 with total occlusion of LAD with R to L collaterals.  - 1/22 S/P DES to proximal/mid LAD. - no s/s ischemia - Continue high dose statin, aspirin, and plavix. - Continue CR  3. ETOH - Reminded to limit alcohol intake   4. Former Tobacco Abuse -Has not smoked in over 2 months.  Arvilla Meres MD 11:02 AM

## 2018-02-27 NOTE — Progress Notes (Signed)
Paramedicine Encounter   Patient ID: Sergio Hicks , male,   DOB: 1981/06/18,36 y.o.,  MRN: [id number]  Sergio Hicks was seen at the clinic with Dr Gala Romney today and reported feeling well. He reports have "charlie horse" sensations in his legs and also says his vest has been alerting often while at home but he denies any shocks being delivered. He has been taking his medications as directed and has just ran out of entresto following this morning's dose. He currently does not have insurance of any kind, nor does he have an income. We will obtain samples for this week and work on getting medication assistance in the coming weeks. Dr Gala Romney has stopped daily Lasix and instructed him to take it as needed for weight gain or swelling.   Time spent with patient: 34 minutes  Jacqualine Code, EMT 02/27/2018   ACTION: Home visit completed Next visit planned for 1 week

## 2018-02-27 NOTE — Telephone Encounter (Signed)
Medication Samples have been provided to the patient.  Drug name: Sherryll Burger     Strength: 24-26 mg        Qty:  2  LOT: [id number] Exp.Date: 06/21  Dosing instructions: Take 1 Tablet Twice daily  The patient has been instructed regarding the correct time, dose, and frequency of taking this medication, including desired effects and most common side effects.   Georgina Peer 12:23 PM 02/27/2018

## 2018-03-02 ENCOUNTER — Encounter (HOSPITAL_COMMUNITY)
Admission: RE | Admit: 2018-03-02 | Discharge: 2018-03-02 | Disposition: A | Discharge status: Home / Self Care | Payer: Self-pay | Source: Ambulatory Visit | Attending: Internal Medicine | Admitting: Internal Medicine

## 2018-03-03 ENCOUNTER — Encounter (HOSPITAL_COMMUNITY): Payer: Self-pay | Admitting: Pharmacist

## 2018-03-04 ENCOUNTER — Telehealth (HOSPITAL_COMMUNITY): Payer: Self-pay | Admitting: Pharmacist

## 2018-03-04 ENCOUNTER — Encounter (HOSPITAL_COMMUNITY): Payer: Self-pay

## 2018-03-04 ENCOUNTER — Other Ambulatory Visit (HOSPITAL_COMMUNITY): Payer: Self-pay | Admitting: Pharmacist

## 2018-03-04 MED ORDER — SACUBITRIL-VALSARTAN 24-26 MG PO TABS: 1.0000 | ORAL_TABLET | Freq: Two times a day (BID) | ORAL | 11 refills | Status: DC

## 2018-03-04 NOTE — Telephone Encounter (Signed)
[employer] patient assistance approved for [employer] 24-26 mg BID through 03/05/19.   Tyler Deis. Bonnye Fava, PharmD, BCPS, CPP Clinical Pharmacist Phone: [phone] 03/04/2018 4:30 PM

## 2018-03-05 ENCOUNTER — Other Ambulatory Visit (HOSPITAL_COMMUNITY): Payer: Self-pay

## 2018-03-05 NOTE — Progress Notes (Signed)
Paramedicine Encounter    Patient ID: Sergio Hicks, male    DOB: 04-Dec-1981, 37 y.o.   MRN: [id number]   Patient Care Team: Hoy Register, MD as PCP - General (Family Medicine)  Patient Active Problem List   Diagnosis Date Noted  . Obesity 01/26/2018  . ACS (acute coronary syndrome) (HCC) 01/18/2018  . Heart attack (HCC)   . Ischemic cardiomyopathy     Current Outpatient Medications:  .  albuterol (PROVENTIL HFA;VENTOLIN HFA) 108 (90 Base) MCG/ACT inhaler, Inhale 1-2 puffs into the lungs every 6 (six) hours as needed for wheezing or shortness of breath., Disp: , Rfl:  .  aspirin EC 81 MG EC tablet, Take 1 tablet (81 mg total) by mouth daily., Disp: 30 tablet, Rfl: 6 .  atorvastatin (LIPITOR) 80 MG tablet, Take 1 tablet (80 mg total) by mouth daily at 6 PM., Disp: 30 tablet, Rfl: 6 .  carvedilol (COREG) 3.125 MG tablet, Take 1 tablet (3.125 mg total) by mouth 2 (two) times daily with a meal., Disp: , Rfl:  .  clopidogrel (PLAVIX) 75 MG tablet, Take 1 tablet (75 mg total) by mouth daily with breakfast., Disp: 30 tablet, Rfl: 6 .  furosemide (LASIX) 40 MG tablet, Take 1 tablet (40 mg total) by mouth as needed., Disp: 30 tablet, Rfl:  .  potassium chloride SA (K-DUR,KLOR-CON) 20 MEQ tablet, Take 2 tablets (40 mEq total) by mouth daily., Disp: 60 tablet, Rfl: 3 .  sacubitril-valsartan (ENTRESTO) 24-26 MG, Take 1 tablet by mouth 2 (two) times daily., Disp: 60 tablet, Rfl: 11 .  spironolactone (ALDACTONE) 25 MG tablet, Take 1 tablet (25 mg total) by mouth daily., Disp: , Rfl:  .  nitroGLYCERIN (NITROSTAT) 0.4 MG SL tablet, Place 1 tablet (0.4 mg total) under the tongue every 5 (five) minutes as needed for chest pain. (Patient not taking: Reported on 03/05/2018), Disp: 60 tablet, Rfl: 0 No Known Allergies    Social History   Socioeconomic History  . Marital status: Single    Spouse name: Not on file  . Number of children: Not on file  . Years of education: Not on file  . Highest  education level: Not on file  Social Needs  . Financial resource strain: Not on file  . Food insecurity - worry: Not on file  . Food insecurity - inability: Not on file  . Transportation needs - medical: Not on file  . Transportation needs - non-medical: Not on file  Occupational History  . Occupation: chef  Tobacco Use  . Smoking status: Former Smoker    Packs/day: 0.00    Types: Cigarettes  . Smokeless tobacco: Never Used  Substance and Sexual Activity  . Alcohol use: Yes    Alcohol/week: 6.0 oz    Types: 10 Standard drinks or equivalent per week  . Drug use: Not on file  . Sexual activity: Not on file  Other Topics Concern  . Not on file  Social History Narrative  . Not on file    Physical Exam  Constitutional: He is oriented to person, place, and time.  Cardiovascular: Normal rate and regular rhythm.  Pulmonary/Chest: Effort normal and breath sounds normal. No respiratory distress. He has no wheezes. He has no rales.  Abdominal: Soft.  Musculoskeletal: Normal range of motion. He exhibits no edema.  Neurological: He is alert and oriented to person, place, and time.  Skin: Skin is warm and dry.  Psychiatric: He has a normal mood and affect.    SAFE -  02/05/18 1100      Situation   Admitting diagnosis  chf    Heart failure history  Exisiting    Comorbidities  HTN;Hx MI/CAD    Readmitted within 30 days  No    Hospital admission within past 12 months  Yes    number of hospital admissions  1    number of ED visits  1      Assessment   Lives alone  Yes    Primary support person  Self    Mode of transportation  personal car    Other services involved  None    Home equipement  Scale      Weight   Weighs self daily  Yes    Scale provided  No    Records on weight chart  Yes      Resources   Has "Living better w/heart failure" book  Yes    Has HF Zone tool  Yes    Able to identify yellow zone signs/when to call MD  Yes    Records zone daily  Yes       Medications   Uses a pill box  Yes    Who stocks the pill box  Paramedicine    Pill box checked this visit  Yes    Pill box refilled this visit  Yes    Difficulty obtaining medications  Yes    Barriers to medication  Financial    Community Paramedic obtains medications from pharmacy  No    Mail order medications  No    Missed one or more doses of medications per week  No      Nutrition   Patient receives meals on wheels  No    Patient follows low sodium diet  Yes    Has foods at home that meet the current recommended diet  Yes    Patient follows low sugar/card diet  No    Nutritional concerns/issues  n      Activity Level   ADL's/Mobility  Independent    How many feet can patient ambulate  300    Typical activity level  Active      Urine   Difficulty urinating  Yes    Changes in urine  Other      Time spent with patient   Time spent with patient   60 Minutes         Future Appointments  Date Time Provider Department Center  03/06/2018  1:15 PM [facility] MA[facility]TENANCE [facility] None  03/09/2018  1:15 PM [facility] MA[facility]TENANCE [facility] None  03/11/2018  1:15 PM [facility] MAINTENANCE [facility] None  03/11/2018  2:45 PM [facility] PHARMACY [facility] None  03/13/2018  1:15 PM [facility] MAINTENANCE [facility] None  03/16/2018  1:15 PM [facility] MA[facility]TENANCE [facility] None  03/18/2018  1:15 PM [facility] MA[facility]TENANCE [facility] None  03/20/2018  1:15 PM [facility] MAINTENANCE [facility] None  03/23/2018  1:15 PM [facility] MAINTENANCE [facility] None  03/25/2018  1:15 PM [facility] MAINTENANCE [facility] None  03/27/2018  1:15 PM [facility] MAINTENANCE [facility] None  03/30/2018  1:15 PM [facility] MA[facility]TENAN[facility] None  04/01/2018  1:15 PM [facility] MAINTENANCE [facility] None  04/03/2018  1:15 PM [facility] MAINTENANCE [facility] None  04/06/2018  1:15 PM [facility] MAINTENANCE [facility] None  04/08/2018  1:15 PM [facility]CARDIAC  REHAB MAINTENANCE [facility] None  04/10/2018  1:15 PM [facility] REH[facility] MAINTENANCE [facility] None  04/13/2018  1:15 PM MC-CARDIAC  REHAB MAINTENANCE [facility] None  04/15/2018 11:00 AM MC ECHO 1-BUZZ [facility]  04/15/2018 12:00 PM Bensimhon, Bevelyn Buckles, MD [facility] None  04/15/2018  1:15 PM [facility]CARDIAC REHAB MAINTENANCE [facility] None  04/17/2018  1:15 PM [facility]ARDIAC REHAB MAINTENANCE [facility] None  04/20/2018  1:15 PM [facility]ARD[facility]AC REHAB MAINTENANCE [facility] None  04/22/2018  1:15 PM [facility]ARDIAC REHAB MAINTENANCE [facility] None  04/24/2018  1:15 PM [facility]CARDIAC REHAB MAINTENANCE [facility] None  04/27/2018  6:45 AM [facility]CARDIAC REHAB MAINTENANCE [facility] None  04/27/2018  1:45 PM Newlin, Enobong, MD [facility] None  04/29/2018  1:15 PM [facility]CARDIAC REHAB MAINTENANCE [facility] None  05/01/2018  1:15 PM [facility]CARDIAC REHAB MAINTENANCE [facility] None  05/04/2018  1:15 PM [facility]CARDIAC REHAB MAINTENANCE [facility] None  05/06/2018  1:15 PM MC-CARDIAC REHAB MAINTENANCE [facility] None  05/08/2018  1:15 PM [facility]CARDIAC REHAB MAINTENANCE [facility] None  05/11/2018  1:15 PM MC[facility]CARDIAC REHAB MAINTENANCE [facility] None  05/13/2018  1:15 PM [facility]CARDIAC REHAB MAINTENANCE [facility] None  05/15/2018  1:15 PM MC[facility]CARDIAC REHAB MAINTENANCE [facility] None  05/18/2018  1:15 PM [facility]CARDIAC REHAB MAINTENANCE [facility] None  05/20/2018  1:15 PM [facility]CARDIAC REHAB MAINTENANCE [facility] None  05/22/2018  1:15 PM [facility]-CARDIAC REHAB MAINTENANCE [facility] None  05/27/2018  1:15 PM [facility]CARDIAC REHAB MAINTENANCE [facility] None  05/29/2018  1:15 PM [facility]CARDIAC REHAB MAINTENANCE [facility] None    BP 98/74 (BP Location: Left Arm, Patient Position: Sitting, Cuff Size: Large)   Pulse 80   Resp 16   Wt 286 lb 12.8 oz (130.1 kg)   SpO2 98%   BMI 35.85 kg/m   Weight yesterday- 286 lb Last visit weight-   Mr Loomis was seen at home today and reported feeling well. He has persistent "weird pains" in various areas of his body, including legs, arms and shoulders. He stated he has not had any  chest pain in the past week and is remaining compliant with his diet and fluid restrictions. He seems concerned with knowing which aches and pains to be concerned with but I have encouraged him to trust his body and if he feels like something is wrong, he should seek help either by calling me, the clinic of presenting to the ED. His medications were verified and his pillbox was refilled.   Time spent with patient: 43 minutes  Jacqualine Code, EMT 03/05/18  ACTION: Home visit completed Next visit planned for 1 week

## 2018-03-06 ENCOUNTER — Encounter (HOSPITAL_COMMUNITY)
Admission: RE | Admit: 2018-03-06 | Discharge: 2018-03-06 | Disposition: A | Discharge status: Home / Self Care | Payer: Self-pay | Source: Ambulatory Visit | Attending: Internal Medicine | Admitting: Internal Medicine

## 2018-03-06 ENCOUNTER — Other Ambulatory Visit (HOSPITAL_COMMUNITY): Payer: Self-pay | Admitting: *Deleted

## 2018-03-06 MED ORDER — SACUBITRIL-VALSARTAN 24-26 MG PO TABS: 1.0000 | ORAL_TABLET | Freq: Two times a day (BID) | ORAL | 11 refills | Status: DC

## 2018-03-09 ENCOUNTER — Encounter (HOSPITAL_COMMUNITY)
Admission: RE | Admit: 2018-03-09 | Discharge: 2018-03-09 | Disposition: A | Discharge status: Home / Self Care | Payer: Self-pay | Source: Ambulatory Visit | Attending: Internal Medicine | Admitting: Internal Medicine

## 2018-03-11 ENCOUNTER — Encounter (HOSPITAL_COMMUNITY): Payer: Self-pay

## 2018-03-11 ENCOUNTER — Other Ambulatory Visit (HOSPITAL_COMMUNITY): Payer: Self-pay

## 2018-03-11 ENCOUNTER — Ambulatory Visit (HOSPITAL_COMMUNITY)
Admission: RE | Admit: 2018-03-11 | Discharge: 2018-03-11 | Disposition: A | Discharge status: Home / Self Care | Payer: Self-pay | Source: Ambulatory Visit | Attending: Internal Medicine | Admitting: Internal Medicine

## 2018-03-11 VITALS — BP 96/68 | HR 80 | Wt 292.6 lb

## 2018-03-11 DIAGNOSIS — Z9889 Other specified postprocedural states: Secondary | ICD-10-CM | POA: Insufficient documentation

## 2018-03-11 DIAGNOSIS — I5022 Chronic systolic (congestive) heart failure: Secondary | ICD-10-CM | POA: Insufficient documentation

## 2018-03-11 DIAGNOSIS — Z79899 Other long term (current) drug therapy: Secondary | ICD-10-CM | POA: Insufficient documentation

## 2018-03-11 DIAGNOSIS — F1010 Alcohol abuse, uncomplicated: Secondary | ICD-10-CM | POA: Insufficient documentation

## 2018-03-11 DIAGNOSIS — Z87891 Personal history of nicotine dependence: Secondary | ICD-10-CM | POA: Insufficient documentation

## 2018-03-11 DIAGNOSIS — E669 Obesity, unspecified: Secondary | ICD-10-CM | POA: Insufficient documentation

## 2018-03-11 DIAGNOSIS — I2510 Atherosclerotic heart disease of native coronary artery without angina pectoris: Secondary | ICD-10-CM | POA: Insufficient documentation

## 2018-03-11 DIAGNOSIS — I252 Old myocardial infarction: Secondary | ICD-10-CM | POA: Insufficient documentation

## 2018-03-11 LAB — BASIC METABOLIC PANEL
Anion gap: 8 (ref 5–15)
BUN: 11 mg/dL (ref 6–20)
CHLORIDE: 105 mmol/L (ref 101–111)
CO2: 24 mmol/L (ref 22–32)
Calcium: 9.6 mg/dL (ref 8.9–10.3)
Creatinine, Ser: 0.93 mg/dL (ref 0.61–1.24)
GFR calc non Af Amer: >60 mL/min (ref >60)
Glucose, Bld: 95 mg/dL (ref 65–99)
POTASSIUM: 4.9 mmol/L (ref 3.5–5.1)
SODIUM: 137 mmol/L (ref 135–145)

## 2018-03-11 MED ORDER — CARVEDILOL 6.25 MG PO TABS: 6.2500 mg | ORAL_TABLET | Freq: Two times a day (BID) | ORAL | 2 refills | Status: DC

## 2018-03-11 NOTE — Progress Notes (Signed)
Paramedicine Encounter   Patient ID: Sergio Hicks , male,   DOB: 05-Feb-1981,36 y.o.,  MRN: [id number]   Sergio Hicks was seen in the clinic with Sergio Hicks today and reported feeling well. He has been compliant with his medications and Sergio Hicks advised to increase carvedilol to 6.25 mg BID. He has been approved for [employer] assistance for [employer]. No other changes were made today.   Time spent with patient: 38 minutes  Sergio Hicks, EMT 03/11/2018   ACTION: Next visit planned for tomorrow

## 2018-03-11 NOTE — Progress Notes (Signed)
CSW referred to assist with insurance and resources. Patient is a 37yo male who lives with a roommate. 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 Patient reports many outstanding hospital and medical bills and "can't pay them all". Patient shared he has met with a Development worker, community at [facility] but not clear if he has a pending medicaid or [place] discount program. Patient plans to return to work and does not see the need for permanent disability. Patient will follow up with financial counseling to determine next step. CSW will follow up with financial counselor at [facility] as well for better understanding of options and return call to patient. Raquel Sarna, [place], [place]

## 2018-03-11 NOTE — Patient Instructions (Addendum)
It was great to see you today!  Please INCREASE your carvedilol to 6.25 mg TWICE DAILY.   Blood work today. We will call you with any changes.   Please keep your appointment with Dr. Gala Romney on 04/15/18.

## 2018-03-11 NOTE — Progress Notes (Signed)
HPI:  [name] is a 37 yo [place] American male with history of obesity, systolic heart failure due to iCM, CAD s/p OOH anterior STEMI with DES to proximal/mid LAD in 1/19, ETOH abuse, and tobacco abuse.  Presents today for pharmacist-led HF medication titration. At last HF clinic visit on 02/27/18, his daily furosemide was switched to PRN. He states that recently he feels like he may be coming down with a cold or with allergies but weight at home has been stable. Has Paramedicine Timothy Lasso) following - report some low BPs at home in 90s. He has not been smoking. Going to cardiac rehab and was even able to walk 3 miles yesterday with his dog.    Marland Kitchen Shortness of breath/dyspnea on exertion? yes  . Orthopnea/PND? no . Edema? no . Lightheadedness/dizziness? Yes - doing too much  . Daily weights at home? Yes - stable around 285 lb . Blood pressure/heart rate monitoring at home? Yes - stable with SBP in 100s . Following low-sodium/fluid-restricted diet? 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  no  Does the patient have any problems obtaining medications due to transportation or finances?   Yes - no Rx insurance -- [employer] patient assistance for Sherryll Burger approved through 03/05/19 -- Annice Pih, CSW, to see today  Understanding of regimen: good Understanding of indications: good Potential of compliance: good Patient understands to avoid NSAIDs. Patient understands to avoid decongestants.    Pertinent Lab Values: . 03/11/18: Serum creatinine 0.93, BUN 11, Potassium 4.9, Sodium 137  Vital Signs: . Weight: 292 lb (dry weight: 285-290 lb) . Blood pressure: 96/68 mmHg  . Heart rate: 80 bpm   Assessment: 1.  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 NYHA class II-IIIsymptoms. -Increase to carvedilol 6.25mg  BID -Continue furosemide 40 mg PO daily PRN weight gain/SOB/edema, Entresto 24-26 mg BID and spironolactone 25 mg PO daily  -Basic disease state pathophysiology, medication indication, mechanism and side effects reviewed at length with patient and he verbalized understanding 2. CAD - out of hospital anterior MI with late presentation(cath 1/20 with total occlusion of LAD with R to L collaterals; 1/22 S/P DES to proximal/mid LAD) - no s/s ischemia - Continue high dose statin, aspirin, plavix, BB, ARB and AA - Continue cardiac rehab 3. ETOH - Reminded to limit alcohol intake  4. Former Tobacco Abuse -Has not smoked in over 2 months  Plan: 1) Medication changes: Based on clinical presentation, vital signs and recent labs will increase carvedilol to 6.25mg  BID 2) Labs: BMET today  3) Follow-up: Dr. Gala Romney on 04/15/18   Tyler Deis. Bonnye Fava, PharmD, BCPS, CPP Clinical Pharmacist Pager: [phone] Phone: [phone] 03/10/2018 3:51 PM

## 2018-03-12 ENCOUNTER — Telehealth (HOSPITAL_COMMUNITY): Payer: Self-pay | Admitting: Pharmacist

## 2018-03-12 ENCOUNTER — Other Ambulatory Visit (HOSPITAL_COMMUNITY): Payer: Self-pay

## 2018-03-12 MED FILL — POTASSIUM CL ER 20 MEQ TABL: 20 | 30 days supply | Qty: 60 | Fill #2

## 2018-03-12 MED FILL — CARVEDILOL 6.25 MG TABLET: 6.25 | 34 days supply | Qty: 68 | Fill #0

## 2018-03-12 NOTE — Progress Notes (Signed)
Paramedicine Encounter    Patient ID: Sergio Hicks, male    DOB: September 18, 1981, 37 y.o.   MRN: [id number]   Patient Care Team: Hoy Register, MD as PCP - General (Family Medicine)  Patient Active Problem List   Diagnosis Date Noted  . Obesity 01/26/2018  . ACS (acute coronary syndrome) (HCC) 01/18/2018  . Heart attack (HCC)   . Ischemic cardiomyopathy     Current Outpatient Medications:  .  albuterol (PROVENTIL HFA;VENTOLIN HFA) 108 (90 Base) MCG/ACT inhaler, Inhale 1-2 puffs into the lungs every 6 (six) hours as needed for wheezing or shortness of breath., Disp: , Rfl:  .  aspirin EC 81 MG EC tablet, Take 1 tablet (81 mg total) by mouth daily., Disp: 30 tablet, Rfl: 6 .  atorvastatin (LIPITOR) 80 MG tablet, Take 1 tablet (80 mg total) by mouth daily at 6 PM., Disp: 30 tablet, Rfl: 6 .  carvedilol (COREG) 6.25 MG tablet, Take 1 tablet (6.25 mg total) by mouth 2 (two) times daily., Disp: 60 tablet, Rfl: 2 .  clopidogrel (PLAVIX) 75 MG tablet, Take 1 tablet (75 mg total) by mouth daily with breakfast., Disp: 30 tablet, Rfl: 6 .  furosemide (LASIX) 40 MG tablet, Take 1 tablet (40 mg total) by mouth as needed., Disp: 30 tablet, Rfl:  .  sacubitril-valsartan (ENTRESTO) 24-26 MG, Take 1 tablet by mouth 2 (two) times daily., Disp: 60 tablet, Rfl: 11 .  spironolactone (ALDACTONE) 25 MG tablet, Take 1 tablet (25 mg total) by mouth daily., Disp: , Rfl:  .  nitroGLYCERIN (NITROSTAT) 0.4 MG SL tablet, Place 1 tablet (0.4 mg total) under the tongue every 5 (five) minutes as needed for chest pain. (Patient not taking: Reported on 03/05/2018), Disp: 60 tablet, Rfl: 0 .  potassium chloride SA (K-DUR,KLOR-CON) 20 MEQ tablet, Take 40 mEq by mouth as needed. Only on days when you need to take furosemide, Disp: , Rfl:  No Known Allergies    Social History   Socioeconomic History  . Marital status: Single    Spouse name: Not on file  . Number of children: Not on file  . Years of education: Not on file   . Highest education level: Not on file  Social Needs  . Financial resource strain: Not on file  . Food insecurity - worry: Not on file  . Food insecurity - inability: Not on file  . Transportation needs - medical: Not on file  . Transportation needs - non-medical: Not on file  Occupational History  . Occupation: chef  Tobacco Use  . Smoking status: Former Smoker    Packs/day: 0.00    Types: Cigarettes  . Smokeless tobacco: Never Used  Substance and Sexual Activity  . Alcohol use: Yes    Alcohol/week: 6.0 oz    Types: 10 Standard drinks or equivalent per week  . Drug use: Not on file  . Sexual activity: Not on file  Other Topics Concern  . Not on file  Social History Narrative  . Not on file    Physical Exam  SAFE - 02/05/18 1100      Situation   Admitting diagnosis  chf    Heart failure history  Exisiting    Comorbidities  HTN;Hx MI/CAD    Readmitted within 30 days  No    Hospital admission within past 12 months  Yes    number of hospital admissions  1    number of ED visits  1      Assessment  Lives alone  Yes    Primary support person  Self    Mode of transportation  personal car    Other services involved  None    Home equipement  Scale      Weight   Weighs self daily  Yes    Scale provided  No    Records on weight chart  Yes      Resources   Has "Living better w/heart failure" book  Yes    Has HF Zone tool  Yes    Able to identify yellow zone signs/when to call MD  Yes    Records zone daily  Yes      Medications   Uses a pill box  Yes    Who stocks the pill box  Paramedicine    Pill box checked this visit  Yes    Pill box refilled this visit  Yes    Difficulty obtaining medications  Yes    Barriers to medication  Financial    Community Paramedic obtains medications from pharmacy  No    Mail order medications  No    Missed one or more doses of medications per week  No      Nutrition   Patient receives meals on wheels  No    Patient follows  low sodium diet  Yes    Has foods at home that meet the current recommended diet  Yes    Patient follows low sugar/card diet  No    Nutritional concerns/issues  n      Activity Level   ADL's/Mobility  Independent    How many feet can patient ambulate  300    Typical activity level  Active      Urine   Difficulty urinating  Yes    Changes in urine  Other      Time spent with patient   Time spent with patient   60 Minutes         Future Appointments  Date Time Provider Department Center  03/16/2018  1:15 PM [facility]IAC REHAB MAINTENANCE [facility] None  03/18/2018  1:15 PM [facility]ARDIAC REHAB MAINTENANCE [facility] None  03/20/2018  1:15 PM [facility]CARDIAC REHAB MAINTENANCE [facility] None  03/23/2018  1:15 PM MC-CARDIAC REHAB MAINTENANCE [facility] None  03/25/2018  1:15 PM MC[facility]CARDIAC REHAB MAINTENANCE [facility] None  03/27/2018  1:15 PM MC-CARDIAC REHAB MAINTENANCE [facility] None  03/30/2018  1:15 PM MC-CARDIAC REHAB MAINTENANCE [facility] None  04/01/2018  1:15 PM [facility]CARDIAC REHAB MAINTENANCE [facility] None  04/03/2018  1:15 PM MC[facility]CARDIAC REHAB MAINTENANCE [facility] None  04/06/2018  1:15 PM MC[facility]CARDIAC REHAB MAINTENANCE [facility] None  04/08/2018  1:15 PM MC[facility]CARDIAC REHAB MAINTENANCE [facility] None  04/10/2018  1:15 PM MC-CARDIAC REHAB MAINTENANCE [facility] None  04/13/2018  1:15 PM MC-CARDIAC REHAB MAINTENANCE [facility] None  04/15/2018 11:00 AM MC ECHO 1-BUZZ [facility]  04/15/2018 12:00 PM Bensimhon, Bevelyn Buckles, MD [facility] None  04/15/2018  1:15 PM MC-CARDIAC REHAB MAINTENANCE [facility] None  04/17/2018  1:15 PM MC-CARDIAC REHAB MAINTENANCE [facility] None  04/20/2018  1:15 PM MC-CARDIAC REHAB MAINTENANCE [facility] None  04/22/2018  1:15 PM MC-CARDIAC REHAB MAINTENANCE [facility] None  04/24/2018  1:15 PM MC-CARDIAC REHAB MAINTENANCE [facility] None  04/27/2018  6:45 AM MC-CARDIAC REHAB MAINTENANCE [facility] None  04/27/2018  1:45 PM Newlin, Enobong, MD [facility] None  04/29/2018  1:15 PM MC-CARDIAC REHAB MAINTENANCE [facility]  None  05/01/2018  1:15 PM MC-CARDIAC REHAB MAINTENANCE [facility] None  05/04/2018  1:15 PM MC-CARDIAC REHAB MAINTENANCE [facility] None  05/06/2018  1:15 PM MC-CARDIAC REHAB MAINTENANCE [facility] None  05/08/2018  1:15 PM MC[facility]CARDIAC REHAB MAINTENANCE [facility] None  05/11/2018  1:15 PM MC-CARDIAC REHAB MAINTENANCE [facility] None  05/13/2018  1:15 PM MC-CARDIAC REHAB MAINTENANCE [facility] None  05/15/2018  1:15 PM MC-CARDIAC REHAB MAINTENANCE [facility] None  05/18/2018  1:15 PM MC-CARDIAC REHAB MAINTENANCE [facility] None  05/20/2018  1:15 PM MC-CARDIAC REHAB MAINTENANCE [facility] None  05/22/2018  1:15 PM MC-CARDIAC REHAB MAINTENANCE [facility] None  05/27/2018  1:15 PM MC-CARDIAC REHAB MAINTENANCE [facility] None  05/29/2018  1:15 PM MC-CARDIAC REHAB MAINTENANCE [facility] None    Wt 285 lb 6.4 oz (129.5 kg)   BMI 35.67 kg/m   Weight yesterday- 285 lb Last visit weight-  Mr Sternberg was seen briefly today to verify his medications and fill his pillbox. No changes were necessary. This visit was necessary since he forgot to bring his medications to his clinic appointment.  Time spent with patient: 18 minutes  Jacqualine Code, EMT 03/13/18  ACTION: Home visit completed Next visit planned for 1 week

## 2018-03-12 NOTE — Telephone Encounter (Signed)
With serum K at 4.9, have advised patient to take his KCl only on days when he has to take his PRN furosemide. Patient verbalized understanding.   Tyler Deis. Bonnye Fava, PharmD, BCPS, CPP Clinical Pharmacist Phone: [phone] 03/12/2018 4:51 PM

## 2018-03-13 ENCOUNTER — Encounter (HOSPITAL_COMMUNITY)
Admission: RE | Admit: 2018-03-13 | Discharge: 2018-03-13 | Disposition: A | Discharge status: Home / Self Care | Payer: Self-pay | Source: Ambulatory Visit | Attending: Internal Medicine | Admitting: Internal Medicine

## 2018-03-16 ENCOUNTER — Encounter (HOSPITAL_COMMUNITY)
Admission: RE | Admit: 2018-03-16 | Discharge: 2018-03-16 | Disposition: A | Discharge status: Home / Self Care | Payer: Self-pay | Source: Ambulatory Visit | Attending: Internal Medicine | Admitting: Internal Medicine

## 2018-03-18 ENCOUNTER — Encounter (HOSPITAL_COMMUNITY)
Admission: RE | Admit: 2018-03-18 | Discharge: 2018-03-18 | Disposition: A | Discharge status: Home / Self Care | Payer: Self-pay | Source: Ambulatory Visit | Attending: Internal Medicine | Admitting: Internal Medicine

## 2018-03-20 ENCOUNTER — Other Ambulatory Visit (HOSPITAL_COMMUNITY): Payer: Self-pay

## 2018-03-20 ENCOUNTER — Encounter (HOSPITAL_COMMUNITY)
Admission: RE | Admit: 2018-03-20 | Discharge: 2018-03-20 | Disposition: A | Discharge status: Home / Self Care | Payer: Self-pay | Source: Ambulatory Visit | Attending: Internal Medicine | Admitting: Internal Medicine

## 2018-03-20 MED FILL — CLOPIDOGREL 75 MG TABLET: 75 | 30 days supply | Qty: 30 | Fill #2

## 2018-03-20 MED FILL — ATORVASTATIN 80 MG TABLET: 80 | 30 days supply | Qty: 30 | Fill #2

## 2018-03-20 MED FILL — SPIRONOLACTONE 25 MG TABLET: 25 | 34 days supply | Qty: 34 | Fill #2

## 2018-03-20 NOTE — Progress Notes (Signed)
Paramedicine Encounter    Patient ID: Raymir Bendik, male    DOB: 08-24-1981, 37 y.o.   MRN: [id number]   Patient Care Team: Hoy Register, MD as PCP - General (Family Medicine)  Patient Active Problem List   Diagnosis Date Noted  . Obesity 01/26/2018  . ACS (acute coronary syndrome) (HCC) 01/18/2018  . Heart attack (HCC)   . Ischemic cardiomyopathy     Current Outpatient Medications:  .  albuterol (PROVENTIL HFA;VENTOLIN HFA) 108 (90 Base) MCG/ACT inhaler, Inhale 1-2 puffs into the lungs every 6 (six) hours as needed for wheezing or shortness of breath., Disp: , Rfl:  .  aspirin EC 81 MG EC tablet, Take 1 tablet (81 mg total) by mouth daily., Disp: 30 tablet, Rfl: 6 .  atorvastatin (LIPITOR) 80 MG tablet, Take 1 tablet (80 mg total) by mouth daily at 6 PM., Disp: 30 tablet, Rfl: 6 .  carvedilol (COREG) 6.25 MG tablet, Take 1 tablet (6.25 mg total) by mouth 2 (two) times daily., Disp: 60 tablet, Rfl: 2 .  clopidogrel (PLAVIX) 75 MG tablet, Take 1 tablet (75 mg total) by mouth daily with breakfast., Disp: 30 tablet, Rfl: 6 .  furosemide (LASIX) 40 MG tablet, Take 1 tablet (40 mg total) by mouth as needed., Disp: 30 tablet, Rfl:  .  potassium chloride SA (K-DUR,KLOR-CON) 20 MEQ tablet, Take 40 mEq by mouth as needed. Only on days when you need to take furosemide, Disp: , Rfl:  .  sacubitril-valsartan (ENTRESTO) 24-26 MG, Take 1 tablet by mouth 2 (two) times daily., Disp: 60 tablet, Rfl: 11 .  spironolactone (ALDACTONE) 25 MG tablet, Take 1 tablet (25 mg total) by mouth daily., Disp: , Rfl:  .  nitroGLYCERIN (NITROSTAT) 0.4 MG SL tablet, Place 1 tablet (0.4 mg total) under the tongue every 5 (five) minutes as needed for chest pain. (Patient not taking: Reported on 03/05/2018), Disp: 60 tablet, Rfl: 0 No Known Allergies    Social History   Socioeconomic History  . Marital status: Single    Spouse name: Not on file  . Number of children: Not on file  . Years of education: Not on file   . Highest education level: Not on file  Occupational History  . Occupation: Investment banker, operational  Social Needs  . Financial resource strain: Not on file  . Food insecurity:    Worry: Not on file    Inability: Not on file  . Transportation needs:    Medical: Not on file    Non-medical: Not on file  Tobacco Use  . Smoking status: Former Smoker    Packs/day: 0.00    Types: Cigarettes  . Smokeless tobacco: Never Used  Substance and Sexual Activity  . Alcohol use: Yes    Alcohol/week: 6.0 oz    Types: 10 Standard drinks or equivalent per week  . Drug use: Not on file  . Sexual activity: Not on file  Lifestyle  . Physical activity:    Days per week: Not on file    Minutes per session: Not on file  . Stress: Not on file  Relationships  . Social connections:    Talks on phone: Not on file    Gets together: Not on file    Attends religious service: Not on file    Active member of club or organization: Not on file    Attends meetings of clubs or organizations: Not on file    Relationship status: Not on file  . Intimate partner violence:  Fear of current or ex partner: Not on file    Emotionally abused: Not on file    Physically abused: Not on file    Forced sexual activity: Not on file  Other Topics Concern  . Not on file  Social History Narrative  . Not on file    Physical Exam  Constitutional: He is oriented to person, place, and time.  Neck: No JVD present.  Cardiovascular: Normal rate and regular rhythm.  Pulmonary/Chest: Effort normal and breath sounds normal. No respiratory distress. He has no wheezes. He has no rales.  Abdominal: Soft.  Musculoskeletal: Normal range of motion. He exhibits no edema.  Neurological: He is alert and oriented to person, place, and time.  Skin: Skin is warm and dry.  Psychiatric: He has a normal mood and affect.    SAFE - 02/05/18 1100      Situation   Admitting diagnosis  chf    Heart failure history  Exisiting    Comorbidities  HTN;Hx  MI/CAD    Readmitted within 30 days  No    Hospital admission within past 12 months  Yes    number of hospital admissions  1    number of ED visits  1      Assessment   Lives alone  Yes    Primary support person  Self    Mode of transportation  personal car    Other services involved  None    Home equipement  Scale      Weight   Weighs self daily  Yes    Scale provided  No    Records on weight chart  Yes      Resources   Has "Living better w/heart failure" book  Yes    Has HF Zone tool  Yes    Able to identify yellow zone signs/when to call MD  Yes    Records zone daily  Yes      Medications   Uses a pill box  Yes    Who stocks the pill box  Paramedicine    Pill box checked this visit  Yes    Pill box refilled this visit  Yes    Difficulty obtaining medications  Yes    Barriers to medication  Financial    Community Paramedic obtains medications from pharmacy  No    Mail order medications  No    Missed one or more doses of medications per week  No      Nutrition   Patient receives meals on wheels  No    Patient follows low sodium diet  Yes    Has foods at home that meet the current recommended diet  Yes    Patient follows low sugar/card diet  No    Nutritional concerns/issues  n      Activity Level   ADL's/Mobility  Independent    How many feet can patient ambulate  300    Typical activity level  Active      Urine   Difficulty urinating  Yes    Changes in urine  Other      Time spent with patient   Time spent with patient   60 Minutes         Future Appointments  Date Time Provider Department Center  03/20/2018  1:15 PM [facility] None  03/23/2018  1:15 PM [facility] None  03/25/2018  1:15 PM [facility] None  03/27/2018  1:15 PM [facility] None  03/30/2018  1:15 PM [facility] None  04/01/2018  1:15 PM [facility]  MAINTENANCE [facility] None  04/03/2018  1:15 PM [facility] None  04/06/2018  1:15 PM [facility] None  04/08/2018  1:15 PM [facility] None  04/10/2018  1:15 PM [facility] None  04/13/2018  1:15 PM [facility] None  04/15/2018 11:00 AM [facility] ECHO 1-BUZZ [facility]  04/15/2018 12:00 PM Bensimhon, Bevelyn Buckles, MD [facility] None  04/15/2018  1:15 PM [facility] None  04/17/2018  1:15 PM [facility] None  04/20/2018  1:15 PM [facility]TENA[facility] None  04/22/2018  1:15 PM [facility] None  04/24/2018  1:15 PM [facility]TE[facility] None  04/27/2018  6:45 AM [facility]TENA[facility] None  04/27/2018  1:45 PM Newlin, Enobong, MD [facility] None  04/29/2018  1:15 PM [facility]TENA[facility] None  05/01/2018  1:15 PM [facility] None  05/04/2018  1:15 PM [facility] None  05/06/2018  1:15 PM [facility] None  05/08/2018  1:15 PM [facility] None  05/11/2018  1:15 PM [facility] None  05/13/2018  1:15 PM [facility] None  05/15/2018  1:15 PM [facility] None  05/18/2018  1:15 PM [facility] None  05/20/2018  1:15 PM [facility] None  05/22/2018  1:15 PM [facility] None  05/27/2018  1:15 PM [facility] None  05/29/2018  1:15 PM [facility] None    BP 100/76 (BP Location: Right Arm, Patient Position: Sitting, Cuff Size: Large)   Pulse 81   Resp 16   Wt 283 lb 9.6 oz (128.6 kg)   SpO2 97%   BMI 35.45 kg/m   Weight yesterday- 283 lb Last visit weight- 285 lb    Mr Abalos was seen at home today and reported feeling well. He did have some minor neck discomfort that began upon waking up this morning. He denied SOB, headaches, dizziness or orthopnea. He reported that he has been looking into insurance but was unclear on what he qualifies for. I read the last note from social work which stated is is unclear if he has medicaid pending or [employer]. I will follow up on this next week and see what has been determined. He is currently taking his medications as prescribed. His main concern at present seems to be anxiety. He is understandably concerned that every new pain or sensation could be a sign of an MI or fluid overload. I explained that it is only natural to be hyper-aware of his body after such an event but he should trust his body to tell him if something is wrong. Once we have a better understanding of his insurance situation we will work on finding him a PCP and moving to discharge from paramedicine.   Time spent with patient: 39 minutes  Jacqualine Code, EMT 03/20/18  ACTION: Home visit completed Next visit planned for 1 week

## 2018-03-23 ENCOUNTER — Encounter (HOSPITAL_COMMUNITY): Payer: Self-pay

## 2018-03-25 ENCOUNTER — Encounter (HOSPITAL_COMMUNITY)
Admission: RE | Admit: 2018-03-25 | Discharge: 2018-03-25 | Disposition: A | Discharge status: Home / Self Care | Payer: Self-pay | Source: Ambulatory Visit | Attending: Internal Medicine | Admitting: Internal Medicine

## 2018-03-27 ENCOUNTER — Encounter (HOSPITAL_COMMUNITY): Payer: Self-pay

## 2018-03-30 ENCOUNTER — Encounter (HOSPITAL_COMMUNITY)
Admission: RE | Admit: 2018-03-30 | Discharge: 2018-03-30 | Disposition: A | Discharge status: Home / Self Care | Payer: Self-pay | Source: Ambulatory Visit | Attending: Internal Medicine | Admitting: Internal Medicine

## 2018-03-30 DIAGNOSIS — Z955 Presence of coronary angioplasty implant and graft: Secondary | ICD-10-CM | POA: Insufficient documentation

## 2018-03-30 DIAGNOSIS — I2129 ST elevation (STEMI) myocardial infarction involving other sites: Secondary | ICD-10-CM | POA: Insufficient documentation

## 2018-04-01 ENCOUNTER — Encounter (HOSPITAL_COMMUNITY)
Admission: RE | Admit: 2018-04-01 | Discharge: 2018-04-01 | Disposition: A | Discharge status: Home / Self Care | Payer: Self-pay | Source: Ambulatory Visit | Attending: Internal Medicine | Admitting: Internal Medicine

## 2018-04-01 ENCOUNTER — Other Ambulatory Visit (HOSPITAL_COMMUNITY): Payer: Self-pay

## 2018-04-01 NOTE — Progress Notes (Signed)
Paramedicine Encounter    Patient ID: Sergio Hicks, male    DOB: 08-24-1981, 37 y.o.   MRN: [id number]   Patient Care Team: Hoy Register, MD as PCP - General (Family Medicine)  Patient Active Problem List   Diagnosis Date Noted  . Obesity 01/26/2018  . ACS (acute coronary syndrome) (HCC) 01/18/2018  . Heart attack (HCC)   . Ischemic cardiomyopathy     Current Outpatient Medications:  .  albuterol (PROVENTIL HFA;VENTOLIN HFA) 108 (90 Base) MCG/ACT inhaler, Inhale 1-2 puffs into the lungs every 6 (six) hours as needed for wheezing or shortness of breath., Disp: , Rfl:  .  aspirin EC 81 MG EC tablet, Take 1 tablet (81 mg total) by mouth daily., Disp: 30 tablet, Rfl: 6 .  atorvastatin (LIPITOR) 80 MG tablet, Take 1 tablet (80 mg total) by mouth daily at 6 PM., Disp: 30 tablet, Rfl: 6 .  carvedilol (COREG) 6.25 MG tablet, Take 1 tablet (6.25 mg total) by mouth 2 (two) times daily., Disp: 60 tablet, Rfl: 2 .  clopidogrel (PLAVIX) 75 MG tablet, Take 1 tablet (75 mg total) by mouth daily with breakfast., Disp: 30 tablet, Rfl: 6 .  furosemide (LASIX) 40 MG tablet, Take 1 tablet (40 mg total) by mouth as needed., Disp: 30 tablet, Rfl:  .  potassium chloride SA (K-DUR,KLOR-CON) 20 MEQ tablet, Take 40 mEq by mouth as needed. Only on days when you need to take furosemide, Disp: , Rfl:  .  sacubitril-valsartan (ENTRESTO) 24-26 MG, Take 1 tablet by mouth 2 (two) times daily., Disp: 60 tablet, Rfl: 11 .  spironolactone (ALDACTONE) 25 MG tablet, Take 1 tablet (25 mg total) by mouth daily., Disp: , Rfl:  .  nitroGLYCERIN (NITROSTAT) 0.4 MG SL tablet, Place 1 tablet (0.4 mg total) under the tongue every 5 (five) minutes as needed for chest pain. (Patient not taking: Reported on 03/05/2018), Disp: 60 tablet, Rfl: 0 No Known Allergies    Social History   Socioeconomic History  . Marital status: Single    Spouse name: Not on file  . Number of children: Not on file  . Years of education: Not on file   . Highest education level: Not on file  Occupational History  . Occupation: Investment banker, operational  Social Needs  . Financial resource strain: Not on file  . Food insecurity:    Worry: Not on file    Inability: Not on file  . Transportation needs:    Medical: Not on file    Non-medical: Not on file  Tobacco Use  . Smoking status: Former Smoker    Packs/day: 0.00    Types: Cigarettes  . Smokeless tobacco: Never Used  Substance and Sexual Activity  . Alcohol use: Yes    Alcohol/week: 6.0 oz    Types: 10 Standard drinks or equivalent per week  . Drug use: Not on file  . Sexual activity: Not on file  Lifestyle  . Physical activity:    Days per week: Not on file    Minutes per session: Not on file  . Stress: Not on file  Relationships  . Social connections:    Talks on phone: Not on file    Gets together: Not on file    Attends religious service: Not on file    Active member of club or organization: Not on file    Attends meetings of clubs or organizations: Not on file    Relationship status: Not on file  . Intimate partner violence:  Fear of current or ex partner: Not on file    Emotionally abused: Not on file    Physically abused: Not on file    Forced sexual activity: Not on file  Other Topics Concern  . Not on file  Social History Narrative  . Not on file    Physical Exam  Constitutional: He is oriented to person, place, and time.  Cardiovascular: Normal rate and regular rhythm.  Pulmonary/Chest: Effort normal and breath sounds normal. No respiratory distress. He has no wheezes. He has no rales.  Abdominal: Soft. He exhibits no distension.  Musculoskeletal: Normal range of motion. He exhibits no edema.  Neurological: He is alert and oriented to person, place, and time.  Skin: Skin is warm and dry.  Psychiatric: He has a normal mood and affect.    SAFE - 02/05/18 1100      Situation   Admitting diagnosis  chf    Heart failure history  Exisiting    Comorbidities  HTN;Hx  MI/CAD    Readmitted within 30 days  No    Hospital admission within past 12 months  Yes    number of hospital admissions  1    number of ED visits  1      Assessment   Lives alone  Yes    Primary support person  Self    Mode of transportation  personal car    Other services involved  None    Home equipement  Scale      Weight   Weighs self daily  Yes    Scale provided  No    Records on weight chart  Yes      Resources   Has "Living better w/heart failure" book  Yes    Has HF Zone tool  Yes    Able to identify yellow zone signs/when to call MD  Yes    Records zone daily  Yes      Medications   Uses a pill box  Yes    Who stocks the pill box  Paramedicine    Pill box checked this visit  Yes    Pill box refilled this visit  Yes    Difficulty obtaining medications  Yes    Barriers to medication  Financial    Community Paramedic obtains medications from pharmacy  No    Mail order medications  No    Missed one or more doses of medications per week  No      Nutrition   Patient receives meals on wheels  No    Patient follows low sodium diet  Yes    Has foods at home that meet the current recommended diet  Yes    Patient follows low sugar/card diet  No    Nutritional concerns/issues  n      Activity Level   ADL's/Mobility  Independent    How many feet can patient ambulate  300    Typical activity level  Active      Urine   Difficulty urinating  Yes    Changes in urine  Other      Time spent with patient   Time spent with patient   60 Minutes         Future Appointments  Date Time Provider Department Center  04/01/2018  1:15 PM [facility] None  04/03/2018  1:15 PM [facility] None  04/06/2018  1:15 PM [facility] None  04/08/2018  1:15 PM [facility] None  04/10/2018  1:15 PM [facility] None  04/13/2018  1:15 PM [facility] MAINTENANCE  [facility] None  04/15/2018 11:00 AM [facility] E[facility] 1-BUZZ [facility]  04/15/2018 12:00 PM Bensimhon, Bevelyn Buckles, MD [facility] None  04/15/2018  1:15 PM [facility]TE[facility] None  04/17/2018  1:15 PM [facility] None  04/20/2018  1:15 PM [facility]TE[facility] None  04/22/2018  1:15 PM [facility]TE[facility] None  04/24/2018  1:15 PM [facility]TENA[facility] None  04/27/2018  6:45 AM [facility]TE[facility] None  04/27/2018  1:45 PM Newlin, Enobong, MD [facility] None  04/29/2018  1:15 PM [facility]TENA[facility] None  05/01/2018  1:15 PM [facility]TENA[facility] None  05/04/2018  1:15 PM [facility]TENA[facility] None  05/06/2018  1:15 PM [facility]TE[facility] None  05/08/2018  1:15 PM [facility]TENA[facility] None  05/11/2018  1:15 PM [facility]TE[facility] None  05/13/2018  1:15 PM [facility]TE[facility] None  05/15/2018  1:15 PM [facility]TE[facility] None  05/18/2018  1:15 PM [facility]TE[facility] None  05/20/2018  1:15 PM [facility]TE[facility] None  05/22/2018  1:15 PM [facility]TENA[facility] None  05/27/2018  1:15 PM [facility] None  05/29/2018  1:15 PM [facility] None    BP 98/70 (BP Location: Right Arm, Patient Position: Sitting, Cuff Size: Large)   Pulse 81   Resp 16   Wt 280 lb 3.2 oz (127.1 kg)   SpO2 98%   BMI 35.02 kg/m   Weight yesterday- 280 lb Last visit weight- 283.6 lb  Mr Birdwell was seen at home today and reported feeling well. He denied headache, orthopnea, or dizziness but stated he has been having SOB in the mornings. He stated he has been using his inhaler multiple times a week when he wakes up but he was asymptomatic while I was with him. He is currently not sure about which insurance  he is going to get and said he needs a primary care physician. These are the last two issues I would like to see resolved before moving to discharge.  Time spent with patient: 29 minutes  Jacqualine Code, EMT 04/01/18  ACTION: Home visit completed Next visit planned for 1 week

## 2018-04-03 ENCOUNTER — Encounter (HOSPITAL_COMMUNITY): Payer: Self-pay

## 2018-04-06 ENCOUNTER — Encounter (HOSPITAL_COMMUNITY): Payer: Self-pay

## 2018-04-08 ENCOUNTER — Encounter (HOSPITAL_COMMUNITY): Payer: Self-pay

## 2018-04-08 ENCOUNTER — Other Ambulatory Visit (HOSPITAL_COMMUNITY): Payer: Self-pay

## 2018-04-08 NOTE — Progress Notes (Signed)
Paramedicine Encounter    Patient ID: Sergio Hicks, male    DOB: 06/24/1981, 37 y.o.   MRN: [id number]   Patient Care Team: Hoy Register, MD as PCP - General (Family Medicine)  Patient Active Problem List   Diagnosis Date Noted  . Obesity 01/26/2018  . ACS (acute coronary syndrome) (HCC) 01/18/2018  . Heart attack (HCC)   . Ischemic cardiomyopathy     Current Outpatient Medications:  .  albuterol (PROVENTIL HFA;VENTOLIN HFA) 108 (90 Base) MCG/ACT inhaler, Inhale 1-2 puffs into the lungs every 6 (six) hours as needed for wheezing or shortness of breath., Disp: , Rfl:  .  aspirin EC 81 MG EC tablet, Take 1 tablet (81 mg total) by mouth daily., Disp: 30 tablet, Rfl: 6 .  atorvastatin (LIPITOR) 80 MG tablet, Take 1 tablet (80 mg total) by mouth daily at 6 PM., Disp: 30 tablet, Rfl: 6 .  carvedilol (COREG) 6.25 MG tablet, Take 1 tablet (6.25 mg total) by mouth 2 (two) times daily., Disp: 60 tablet, Rfl: 2 .  clopidogrel (PLAVIX) 75 MG tablet, Take 1 tablet (75 mg total) by mouth daily with breakfast., Disp: 30 tablet, Rfl: 6 .  furosemide (LASIX) 40 MG tablet, Take 1 tablet (40 mg total) by mouth as needed., Disp: 30 tablet, Rfl:  .  nitroGLYCERIN (NITROSTAT) 0.4 MG SL tablet, Place 1 tablet (0.4 mg total) under the tongue every 5 (five) minutes as needed for chest pain. (Patient not taking: Reported on 03/05/2018), Disp: 60 tablet, Rfl: 0 .  potassium chloride SA (K-DUR,KLOR-CON) 20 MEQ tablet, Take 40 mEq by mouth as needed. Only on days when you need to take furosemide, Disp: , Rfl:  .  sacubitril-valsartan (ENTRESTO) 24-26 MG, Take 1 tablet by mouth 2 (two) times daily., Disp: 60 tablet, Rfl: 11 .  spironolactone (ALDACTONE) 25 MG tablet, Take 1 tablet (25 mg total) by mouth daily., Disp: , Rfl:  No Known Allergies    Social History   Socioeconomic History  . Marital status: Single    Spouse name: Not on file  . Number of children: Not on file  . Years of education: Not on file   . Highest education level: Not on file  Occupational History  . Occupation: Investment banker, operational  Social Needs  . Financial resource strain: Not on file  . Food insecurity:    Worry: Not on file    Inability: Not on file  . Transportation needs:    Medical: Not on file    Non-medical: Not on file  Tobacco Use  . Smoking status: Former Smoker    Packs/day: 0.00    Types: Cigarettes  . Smokeless tobacco: Never Used  Substance and Sexual Activity  . Alcohol use: Yes    Alcohol/week: 6.0 oz    Types: 10 Standard drinks or equivalent per week  . Drug use: Not on file  . Sexual activity: Not on file  Lifestyle  . Physical activity:    Days per week: Not on file    Minutes per session: Not on file  . Stress: Not on file  Relationships  . Social connections:    Talks on phone: Not on file    Gets together: Not on file    Attends religious service: Not on file    Active member of club or organization: Not on file    Attends meetings of clubs or organizations: Not on file    Relationship status: Not on file  . Intimate partner violence:  Fear of current or ex partner: Not on file    Emotionally abused: Not on file    Physically abused: Not on file    Forced sexual activity: Not on file  Other Topics Concern  . Not on file  Social History Narrative  . Not on file    Physical Exam  Cardiovascular: Normal rate and regular rhythm.  Pulmonary/Chest: Effort normal and breath sounds normal.  Abdominal: Soft. He exhibits no distension.  Musculoskeletal: Normal range of motion. He exhibits no edema.  Neurological: He is alert.  Skin: Skin is warm and dry.  Psychiatric: He has a normal mood and affect.        Future Appointments  Date Time Provider Department Center  04/08/2018  1:15 PM [facility] None  04/10/2018  1:15 PM [facility] None  04/13/2018  1:15 PM [facility]TENA[facility] None  04/15/2018 11:00 AM [facility] ECHO  1-BUZZ [facility]  04/15/2018 12:00 PM Bensimhon, Bevelyn Buckles, MD [facility] None  04/15/2018  1:15 PM [facility]TENA[facility] None  04/17/2018  1:15 PM [facility]TE[facility] None  04/20/2018  1:15 PM [facility]TE[facility] None  04/22/2018  1:15 PM [facility]TE[facility] None  04/24/2018  1:15 PM [facility]TE[facility] None  04/27/2018  6:45 AM [facility]TE[facility] None  04/27/2018  1:45 PM Newlin, Enobong, MD [facility] None  04/29/2018  1:15 PM [facility]TENA[facility] None  05/01/2018  1:15 PM [facility]TENA[facility] None  05/04/2018  1:15 PM [facility]TENA[facility] None  05/06/2018  1:15 PM [facility] MA[facility]TENA[facility] None  05/08/2018  1:15 PM [facility]TENA[facility] None  05/11/2018  1:15 PM [facility] MA[facility]TENA[facility] None  05/13/2018  1:15 PM [facility] MA[facility]TENA[facility] None  05/15/2018  1:15 PM [facility] MA[facility]TENAN[facility] None  05/18/2018  1:15 PM [facility] MA[facility]TENAN[facility] None  05/20/2018  1:15 PM [facility]TE[facility] None  05/22/2018  1:15 PM [facility]TENA[facility] None  05/27/2018  1:15 PM [facility]TE[facility] None  05/29/2018  1:15 PM [facility]TE[facility] None    There were no vitals taken for this visit.  Weight yesterday- 279 lb Last visit weight- 280.2 lb  Sergio Hicks was seen at home today and reported having a sore throat and as a result of allergies. He was unsure what medications he could take for seasonal allergies. I showed him the list of medications in his yellow folder that he could take and the list of medications to avoid. He stated he would be able to get something OTC from the store today since he has this list. He denied orthopnea but said he is SOB when he wakes up first thing in the morning. He still is without a PCP or  insurance, but with his upcoming echo next week, he is hoping to return to work and look for insurance at that point. No other issues were noted. His medications were verified and his pillbox was refilled.   Time spent with patient: 30 minutes  Jacqualine Code, EMT 04/08/18  ACTION: Home visit completed Next visit planned for 1 week

## 2018-04-10 ENCOUNTER — Encounter (HOSPITAL_COMMUNITY)
Admission: RE | Admit: 2018-04-10 | Discharge: 2018-04-10 | Disposition: A | Discharge status: Home / Self Care | Payer: Self-pay | Source: Ambulatory Visit | Attending: Internal Medicine | Admitting: Internal Medicine

## 2018-04-10 ENCOUNTER — Telehealth (HOSPITAL_COMMUNITY): Payer: Self-pay

## 2018-04-10 NOTE — Telephone Encounter (Signed)
Sergio Hicks contacted me via text message to said he was having some aches and pains in his side and back which had him concerned about his kidneys. He said his urine was normal and his bowels were as well. He also had a bruise on his right side but it appeared to be where his like vest battery was sitting during a long car ride that he was concerned about. I advised him to monitor it and seek care at the emergence department if the bruise does not heal or gets worse over the weekend. He also complained of his "guts tingling" but said it was not a nauseated sensation. Again I advised him to seek care in the ED if it gets worse or he feels like there is a serious problem but I do not think that sensation is related to his heart or kidneys. He understood and was agreeable.

## 2018-04-13 ENCOUNTER — Encounter (HOSPITAL_COMMUNITY)
Admission: RE | Admit: 2018-04-13 | Discharge: 2018-04-13 | Disposition: A | Discharge status: Home / Self Care | Payer: Self-pay | Source: Ambulatory Visit | Attending: Internal Medicine | Admitting: Internal Medicine

## 2018-04-15 ENCOUNTER — Encounter (HOSPITAL_COMMUNITY): Payer: Self-pay | Admitting: Internal Medicine

## 2018-04-15 ENCOUNTER — Ambulatory Visit (HOSPITAL_BASED_OUTPATIENT_CLINIC_OR_DEPARTMENT_OTHER)
Admission: RE | Admit: 2018-04-15 | Discharge: 2018-04-15 | Disposition: A | Discharge status: Home / Self Care | Payer: Self-pay | Source: Ambulatory Visit | Attending: Internal Medicine | Admitting: Internal Medicine

## 2018-04-15 ENCOUNTER — Other Ambulatory Visit: Payer: Self-pay

## 2018-04-15 ENCOUNTER — Ambulatory Visit (HOSPITAL_COMMUNITY)
Admission: RE | Admit: 2018-04-15 | Discharge: 2018-04-15 | Disposition: A | Discharge status: Home / Self Care | Payer: Self-pay | Source: Ambulatory Visit | Attending: Internal Medicine | Admitting: Internal Medicine

## 2018-04-15 ENCOUNTER — Encounter (HOSPITAL_COMMUNITY): Payer: Self-pay

## 2018-04-15 VITALS — BP 114/74 | HR 85 | Wt 291.0 lb

## 2018-04-15 DIAGNOSIS — Z7902 Long term (current) use of antithrombotics/antiplatelets: Secondary | ICD-10-CM | POA: Insufficient documentation

## 2018-04-15 DIAGNOSIS — Z7982 Long term (current) use of aspirin: Secondary | ICD-10-CM | POA: Insufficient documentation

## 2018-04-15 DIAGNOSIS — I2510 Atherosclerotic heart disease of native coronary artery without angina pectoris: Secondary | ICD-10-CM | POA: Insufficient documentation

## 2018-04-15 DIAGNOSIS — Z720 Tobacco use: Secondary | ICD-10-CM

## 2018-04-15 DIAGNOSIS — J45909 Unspecified asthma, uncomplicated: Secondary | ICD-10-CM | POA: Insufficient documentation

## 2018-04-15 DIAGNOSIS — I252 Old myocardial infarction: Secondary | ICD-10-CM | POA: Insufficient documentation

## 2018-04-15 DIAGNOSIS — Z79899 Other long term (current) drug therapy: Secondary | ICD-10-CM | POA: Insufficient documentation

## 2018-04-15 DIAGNOSIS — I420 Dilated cardiomyopathy: Secondary | ICD-10-CM | POA: Insufficient documentation

## 2018-04-15 DIAGNOSIS — I5022 Chronic systolic (congestive) heart failure: Secondary | ICD-10-CM | POA: Insufficient documentation

## 2018-04-15 DIAGNOSIS — F1010 Alcohol abuse, uncomplicated: Secondary | ICD-10-CM

## 2018-04-15 DIAGNOSIS — I051 Rheumatic mitral insufficiency: Secondary | ICD-10-CM | POA: Insufficient documentation

## 2018-04-15 DIAGNOSIS — Z9889 Other specified postprocedural states: Secondary | ICD-10-CM | POA: Insufficient documentation

## 2018-04-15 DIAGNOSIS — Z6836 Body mass index (BMI) 36.0-36.9, adult: Secondary | ICD-10-CM | POA: Insufficient documentation

## 2018-04-15 DIAGNOSIS — Z87891 Personal history of nicotine dependence: Secondary | ICD-10-CM | POA: Insufficient documentation

## 2018-04-15 DIAGNOSIS — E669 Obesity, unspecified: Secondary | ICD-10-CM | POA: Insufficient documentation

## 2018-04-15 LAB — BASIC METABOLIC PANEL
ANION GAP: 8 (ref 5–15)
BUN: 12 mg/dL (ref 6–20)
CALCIUM: 9 mg/dL (ref 8.9–10.3)
CO2: 24 mmol/L (ref 22–32)
Chloride: 105 mmol/L (ref 101–111)
Creatinine, Ser: 1.06 mg/dL (ref 0.61–1.24)
Glucose, Bld: 111 mg/dL — ABNORMAL HIGH (ref 65–99)
Potassium: 4.1 mmol/L (ref 3.5–5.1)
Sodium: 137 mmol/L (ref 135–145)

## 2018-04-15 MED ORDER — SACUBITRIL-VALSARTAN 49-51 MG PO TABS: 1.0000 | ORAL_TABLET | Freq: Two times a day (BID) | ORAL | 3 refills | Status: DC

## 2018-04-15 MED ORDER — SPIRONOLACTONE 25 MG PO TABS: 25.0000 mg | ORAL_TABLET | Freq: Every day | ORAL | 2 refills | Status: DC

## 2018-04-15 MED FILL — SPIRONOLACTONE 25 MG TABLET: 25 | 30 days supply | Qty: 30 | Fill #0

## 2018-04-15 NOTE — Patient Instructions (Signed)
Labs today (will call for abnormal results, otherwise no news is good news)  INCREASE Entresto to 49-51 mg (1 Tablet) Twice Daily  Labs in 10 days (bmet)  Follow up in 2 Months

## 2018-04-15 NOTE — Progress Notes (Signed)
PCP: Primary HF Cardiologist: Dr Gala Romney   ADVANCED HF CLINIC NOTE   HPI: Sergio Hicks is a 37 y.o. 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   Admitted 01/18/2018 with OOH anterior MI and severe ICM. Taken urgently for cath which showed 100% LAD occlusion as below. ECHO1/20/19 LVEF 30%. Initially treated medically but developed persistent CP so taken for PCI and pt is s/p DES to proximal/mid LAD 01/20/18.  He presents today for regular follow up. Last visit changed lasix to as needed. Saw Pharm-D and icnreased coreg. He has been feeling OK overall, though allergies have been very bad. He woke up this am with coughing and SOB. Thinks it may just be a URI. He is sore from coughing. He gets SOB with exercise, but nothing that he feels is abnormal. Very mild lightheadedness with rapid standing at times, but not regular or limiting. He thinks it has mostly been his allergies. Taking claritin daily. Doing CR. Going to CR regularly and also going to the gym doing 1 hour of cardio. Trying to eat better. Denies CP or SOB.   - Echo today personally reviewed by Dr. Gala Romney. EF 30-35%.   ECHO 01/18/2018 LVEF 30-35% with large LAD territory WMA concerning for   ischemia/infarct, grade 1 DD with elevated LV filling pressure,   trivial MR, mild LAE, elevated RA pressure.  Review of systems complete and found to be negative unless listed in HPI.    SH:  Social History   Socioeconomic History  . Marital status: Single    Spouse name: Not on file  . Number of children: Not on file  . Years of education: Not on file  . Highest education level: Not on file  Occupational History  . Occupation: Investment banker, operational  Social Needs  . Financial resource strain: Not on file  . Food insecurity:    Worry: Not on file    Inability: Not on file  . Transportation needs:    Medical: Not on file   Non-medical: Not on file  Tobacco Use  . Smoking status: Former Smoker    Packs/day: 0.00    Types: Cigarettes  . Smokeless tobacco: Never Used  Substance and Sexual Activity  . Alcohol use: Yes    Alcohol/week: 6.0 oz    Types: 10 Standard drinks or equivalent per week  . Drug use: Not on file  . Sexual activity: Not on file  Lifestyle  . Physical activity:    Days per week: Not on file    Minutes per session: Not on file  . Stress: Not on file  Relationships  . Social connections:    Talks on phone: Not on file    Gets together: Not on file    Attends religious service: Not on file    Active member of club or organization: Not on file    Attends meetings of clubs or organizations: Not on file    Relationship status: Not on file  . Intimate partner violence:    Fear of current or ex partner: Not on file    Emotionally abused: Not on file    Physically abused: Not on file    Forced sexual activity: Not on file  Other Topics Concern  . Not on file  Social History Narrative  . Not on file    FH:  Family History  Adopted: Yes  Past Medical History:  Diagnosis Date  . Asthma     Current Outpatient Medications  Medication Sig Dispense Refill  . albuterol (PROVENTIL HFA;VENTOLIN HFA) 108 (90 Base) MCG/ACT inhaler Inhale 1-2 puffs into the lungs every 6 (six) hours as needed for wheezing or shortness of breath.    Marland Kitchen aspirin EC 81 MG EC tablet Take 1 tablet (81 mg total) by mouth daily. 30 tablet 6  . atorvastatin (LIPITOR) 80 MG tablet Take 1 tablet (80 mg total) by mouth daily at 6 PM. 30 tablet 6  . carvedilol (COREG) 6.25 MG tablet Take 1 tablet (6.25 mg total) by mouth 2 (two) times daily. 60 tablet 2  . clopidogrel (PLAVIX) 75 MG tablet Take 1 tablet (75 mg total) by mouth daily with breakfast. 30 tablet 6  . furosemide (LASIX) 40 MG tablet Take 1 tablet (40 mg total) by mouth as needed. 30 tablet   . nitroGLYCERIN (NITROSTAT) 0.4 MG SL tablet Place 1 tablet (0.4  mg total) under the tongue every 5 (five) minutes as needed for chest pain. 60 tablet 0  . potassium chloride SA (K-DUR,KLOR-CON) 20 MEQ tablet Take 40 mEq by mouth as needed. Only on days when you need to take furosemide    . sacubitril-valsartan (ENTRESTO) 24-26 MG Take 1 tablet by mouth 2 (two) times daily. 60 tablet 11  . spironolactone (ALDACTONE) 25 MG tablet Take 1 tablet (25 mg total) by mouth daily.     No current facility-administered medications for this encounter.    Vitals:   04/15/18 1155  BP: 114/74  Pulse: 85  SpO2: 98%  Weight: 291 lb (132 kg)   Wt Readings from Last 3 Encounters:  04/15/18 291 lb (132 kg)  04/08/18 278 lb 9.6 oz (126.4 kg)  04/01/18 280 lb 3.2 oz (127.1 kg)    PHYSICAL EXAM: General: Well appearing. No resp difficulty. HEENT: Normal anicteric Neck: Supple. JVP 6-7 cm. Carotids 2+ bilat; no bruits. No thyromegaly or nodule noted. Cor: PMI nondisplaced. RRR, No M/G/R noted Lungs: Diminished basilar sounds, Scant inspiratory wheeze that clears with coughing.  Abdomen: Obese Soft, non-tender, non-distended, no HSM. No bruits or masses. +BS  Extremities: No cyanosis, clubbing, or rash. R and LLE no edema. Warm  Neuro: Alert & orientedx3, cranial nerves grossly intact. moves all 4 extremities w/o difficulty. Affect pleasant   ASSESSMENT & PLAN: 1. Chronic Systolic Heart Failure -01/18/2018 ECHO EF 30-35%. Plan to repeat ECHO in 3 months after HF meds optimized. --> April 2019  - Echo today personally reviewed by Dr. Gala Romney. Conservatively, appears right around 35%, leaning towards 30-35%.  - NYHA II symptoms, now currently with Allergies.  - Volume status stable on exam.  - Continue lasix as needed.  - Increase Entresto to 49/51 mg BID.   -Continue Carvedilol 3.125 mg BID - Continue spironolactone 25 mg daily - Continue Life Vest. Interrogation with no VT/VF.  Suspect will need ICD. Will await formal Echo read.  - Reinforced fluid restriction  to < 2 L daily, sodium restriction to less than 2000 mg daily, and the importance of daily weights.    2. CAD- out of hospital anterior MI with late presentation - cath 1/20 with total occlusion of LAD with R to L collaterals.  - 1/22 S/P DES to proximal/mid LAD. - No s/s of ischemia.    - Continue high dose statin, aspirin, and plavix. - Continue Cardiac Rehab.   3. ETOH - Encouraged complete cessation.   4. Former Tobacco  Abuse - Congratulated on continued abstinence.   5. Allergic Rhinitis - On claritin.  - Recommended Flonase.  Any further per PCP.   Meds as above. May need ICD consideration pending formal echo read.   Graciella Freer, PA-C  12:37 PM  Patient seen and examined with the above-signed Advanced Practice Provider and/or Housestaff. I personally reviewed laboratory data, imaging studies and relevant notes. I independently examined the patient and formulated the important aspects of the plan. I have edited the note to reflect any of my changes or salient points. I have personally discussed the plan with the patient and/or family.  Doing well NYHA II. Volume status stable. Echo today reviewed EF 30-35%. Will increase Entresto to 49/51 bid. CAD stable no angina. Check labs today. Refer to EP for ICD. We discussed pros/cons today.   Arvilla Meres, MD  2:36 PM

## 2018-04-15 NOTE — Progress Notes (Signed)
  Echocardiogram 2D Echocardiogram has been performed.  Delcie Roch 04/15/2018, 11:50 AM

## 2018-04-16 MED FILL — CLOPIDOGREL 75 MG TABLET: 75 | 30 days supply | Qty: 30 | Fill #3

## 2018-04-16 MED FILL — CARVEDILOL 6.25 MG TABLET: 6.25 | 34 days supply | Qty: 68 | Fill #1

## 2018-04-16 MED FILL — ATORVASTATIN 80 MG TABLET: 80 | 30 days supply | Qty: 30 | Fill #3

## 2018-04-17 ENCOUNTER — Other Ambulatory Visit (HOSPITAL_COMMUNITY): Payer: Self-pay

## 2018-04-17 ENCOUNTER — Encounter (HOSPITAL_COMMUNITY)
Admission: RE | Admit: 2018-04-17 | Discharge: 2018-04-17 | Disposition: A | Discharge status: Home / Self Care | Payer: Self-pay | Source: Ambulatory Visit | Attending: Internal Medicine | Admitting: Internal Medicine

## 2018-04-17 NOTE — Progress Notes (Signed)
Paramedicine Encounter    Patient ID: Sergio Hicks, male    DOB: [birth year], 37 y.o.   MRN: [id number]   Patient Care Team: Hoy Register, MD as PCP - General (Family Medicine)  Patient Active Problem List   Diagnosis Date Noted  . Obesity 01/26/2018  . ACS (acute coronary syndrome) (HCC) 01/18/2018  . Heart attack (HCC)   . Ischemic cardiomyopathy     Current Outpatient Medications:  .  albuterol (PROVENTIL HFA;VENTOLIN HFA) 108 (90 Base) MCG/ACT inhaler, Inhale 1-2 puffs into the lungs every 6 (six) hours as needed for wheezing or shortness of breath., Disp: , Rfl:  .  aspirin EC 81 MG EC tablet, Take 1 tablet (81 mg total) by mouth daily., Disp: 30 tablet, Rfl: 6 .  atorvastatin (LIPITOR) 80 MG tablet, Take 1 tablet (80 mg total) by mouth daily at 6 PM., Disp: 30 tablet, Rfl: 6 .  carvedilol (COREG) 6.25 MG tablet, Take 1 tablet (6.25 mg total) by mouth 2 (two) times daily., Disp: 60 tablet, Rfl: 2 .  clopidogrel (PLAVIX) 75 MG tablet, Take 1 tablet (75 mg total) by mouth daily with breakfast., Disp: 30 tablet, Rfl: 6 .  furosemide (LASIX) 40 MG tablet, Take 1 tablet (40 mg total) by mouth as needed., Disp: 30 tablet, Rfl:  .  potassium chloride SA (K-DUR,KLOR-CON) 20 MEQ tablet, Take 40 mEq by mouth as needed. Only on days when you need to take furosemide, Disp: , Rfl:  .  sacubitril-valsartan (ENTRESTO) 49-51 MG, Take 1 tablet by mouth 2 (two) times daily., Disp: 60 tablet, Rfl: 3 .  spironolactone (ALDACTONE) 25 MG tablet, Take 1 tablet (25 mg total) by mouth daily., Disp: 30 tablet, Rfl: 2 .  nitroGLYCERIN (NITROSTAT) 0.4 MG SL tablet, Place 1 tablet (0.4 mg total) under the tongue every 5 (five) minutes as needed for chest pain. (Patient not taking: Reported on 04/17/2018), Disp: 60 tablet, Rfl: 0 No Known Allergies    Social History   Socioeconomic History  . Marital status: Single    Spouse name: Not on file  . Number of children: Not on file  . Years of education: Not  on file  . Highest education level: Not on file  Occupational History  . Occupation: Investment banker, operational  Social Needs  . Financial resource strain: Not on file  . Food insecurity:    Worry: Not on file    Inability: Not on file  . Transportation needs:    Medical: Not on file    Non-medical: Not on file  Tobacco Use  . Smoking status: Former Smoker    Packs/day: 0.00    Types: Cigarettes  . Smokeless tobacco: Never Used  Substance and Sexual Activity  . Alcohol use: Yes    Alcohol/week: 6.0 oz    Types: 10 Standard drinks or equivalent per week  . Drug use: Not on file  . Sexual activity: Not on file  Lifestyle  . Physical activity:    Days per week: Not on file    Minutes per session: Not on file  . Stress: Not on file  Relationships  . Social connections:    Talks on phone: Not on file    Gets together: Not on file    Attends religious service: Not on file    Active member of club or organization: Not on file    Attends meetings of clubs or organizations: Not on file    Relationship status: Not on file  . Intimate partner violence:  Fear of current or ex partner: Not on file    Emotionally abused: Not on file    Physically abused: Not on file    Forced sexual activity: Not on file  Other Topics Concern  . Not on file  Social History Narrative  . Not on file    Physical Exam  Constitutional: He is oriented to person, place, and time.  Cardiovascular: Normal rate and regular rhythm.  Pulmonary/Chest: Effort normal and breath sounds normal.  Abdominal: Soft.  Musculoskeletal: Normal range of motion. He exhibits no edema.  Neurological: He is alert and oriented to person, place, and time.  Skin: Skin is warm and dry.  Psychiatric: He has a normal mood and affect.        Future Appointments  Date Time Provider Department Center  04/17/2018  1:15 PM [facility] None  04/20/2018  1:15 PM [facility] None  04/22/2018   1:15 PM [facility] None  04/24/2018  1:15 PM [facility] None  04/27/2018  6:45 AM [facility] None  04/27/2018  1:30 PM [facility] None  04/27/2018  1:45 PM Newlin, Enobong, MD [facility] None  04/29/2018  1:15 PM [facility] None  05/01/2018  1:15 PM [facility] None  05/04/2018  1:15 PM [facility] None  05/06/2018  1:15 PM [facility] None  05/08/2018  1:15 PM [facility] None  05/11/2018  1:15 PM [facility] None  05/13/2018  1:15 PM [facility] None  05/15/2018  1:15 PM [facility] None  05/18/2018  1:15 PM [facility] None  05/20/2018  1:15 PM [facility] None  05/22/2018  1:15 PM [facility] None  05/27/2018  1:15 PM [facility] None  05/29/2018  1:15 PM [facility] None  06/18/2018  1:40 PM Bensimhon, Bevelyn Buckles, MD [facility] None    BP 96/80 (BP Location: Left Arm, Patient Position: Sitting, Cuff Size: Large)   Pulse 85   Resp 16   Wt 280 lb (127 kg)   SpO2 95%   BMI 35.00 kg/m   Weight yesterday- 279.6 lb Last visit weight- 278 lb  [name] was seen at home today and reported feeling generally well. He denied SOB, headache, dizziness or orthopnea. He reported being compliant with all his medications including the new dose of entresto. His echo shows that he maintained an LV EF or 30-35% and the clinic has referred him to EP. He had a lot of questions regarding the procedure and how to device works. I answered them to the best of my ability and told him to bring these questions up to the physician when he goes. His medications were verified and his pillbox was refilled.  Time spent  with patient: 30 minutes  Jacqualine Code, EMT 04/17/18  ACTION: Home visit completed Next visit planned for 1 week

## 2018-04-20 ENCOUNTER — Telehealth (HOSPITAL_COMMUNITY): Payer: Self-pay | Admitting: *Deleted

## 2018-04-20 ENCOUNTER — Encounter (HOSPITAL_COMMUNITY)
Admission: RE | Admit: 2018-04-20 | Discharge: 2018-04-20 | Disposition: A | Discharge status: Home / Self Care | Payer: Self-pay | Source: Ambulatory Visit | Attending: Internal Medicine | Admitting: Internal Medicine

## 2018-04-20 NOTE — Telephone Encounter (Signed)
Per Dr Gala Romney pt's echo was read as EF 30-35% so pt will need to continue Lifevest for now and get an appt to see EP about getting an ICD.  Per chart pt does not have insurance.  Will need to clarify it pt has insurance or has applied for medicaid or orange card before referral can be made to EP.  Attempted to call pt and Left message to call back

## 2018-04-22 ENCOUNTER — Other Ambulatory Visit (HOSPITAL_COMMUNITY): Payer: Self-pay

## 2018-04-22 ENCOUNTER — Encounter (HOSPITAL_COMMUNITY)
Admission: RE | Admit: 2018-04-22 | Discharge: 2018-04-22 | Disposition: A | Discharge status: Home / Self Care | Payer: Self-pay | Source: Ambulatory Visit | Attending: Internal Medicine | Admitting: Internal Medicine

## 2018-04-22 NOTE — Progress Notes (Signed)
Paramedicine Encounter    Patient ID: Sergio Hicks, male    DOB: Nov 21, 1981, 37 y.o.   MRN: [id number]   Patient Care Team: Hoy Register, MD as PCP - General (Family Medicine)  Patient Active Problem List   Diagnosis Date Noted  . Obesity 01/26/2018  . ACS (acute coronary syndrome) (HCC) 01/18/2018  . Heart attack (HCC)   . Ischemic cardiomyopathy     Current Outpatient Medications:  .  albuterol (PROVENTIL HFA;VENTOLIN HFA) 108 (90 Base) MCG/ACT inhaler, Inhale 1-2 puffs into the lungs every 6 (six) hours as needed for wheezing or shortness of breath., Disp: , Rfl:  .  aspirin EC 81 MG EC tablet, Take 1 tablet (81 mg total) by mouth daily., Disp: 30 tablet, Rfl: 6 .  atorvastatin (LIPITOR) 80 MG tablet, Take 1 tablet (80 mg total) by mouth daily at 6 PM., Disp: 30 tablet, Rfl: 6 .  carvedilol (COREG) 6.25 MG tablet, Take 1 tablet (6.25 mg total) by mouth 2 (two) times daily., Disp: 60 tablet, Rfl: 2 .  clopidogrel (PLAVIX) 75 MG tablet, Take 1 tablet (75 mg total) by mouth daily with breakfast., Disp: 30 tablet, Rfl: 6 .  furosemide (LASIX) 40 MG tablet, Take 1 tablet (40 mg total) by mouth as needed., Disp: 30 tablet, Rfl:  .  potassium chloride SA (K-DUR,KLOR-CON) 20 MEQ tablet, Take 40 mEq by mouth as needed. Only on days when you need to take furosemide, Disp: , Rfl:  .  sacubitril-valsartan (ENTRESTO) 49-51 MG, Take 1 tablet by mouth 2 (two) times daily., Disp: 60 tablet, Rfl: 3 .  spironolactone (ALDACTONE) 25 MG tablet, Take 1 tablet (25 mg total) by mouth daily., Disp: 30 tablet, Rfl: 2 .  nitroGLYCERIN (NITROSTAT) 0.4 MG SL tablet, Place 1 tablet (0.4 mg total) under the tongue every 5 (five) minutes as needed for chest pain. (Patient not taking: Reported on 04/22/2018), Disp: 60 tablet, Rfl: 0 No Known Allergies    Social History   Socioeconomic History  . Marital status: Single    Spouse name: Not on file  . Number of children: Not on file  . Years of education: Not  on file  . Highest education level: Not on file  Occupational History  . Occupation: Investment banker, operational  Social Needs  . Financial resource strain: Not on file  . Food insecurity:    Worry: Not on file    Inability: Not on file  . Transportation needs:    Medical: Not on file    Non-medical: Not on file  Tobacco Use  . Smoking status: Former Smoker    Packs/day: 0.00    Types: Cigarettes  . Smokeless tobacco: Never Used  Substance and Sexual Activity  . Alcohol use: Yes    Alcohol/week: 6.0 oz    Types: 10 Standard drinks or equivalent per week  . Drug use: Not on file  . Sexual activity: Not on file  Lifestyle  . Physical activity:    Days per week: Not on file    Minutes per session: Not on file  . Stress: Not on file  Relationships  . Social connections:    Talks on phone: Not on file    Gets together: Not on file    Attends religious service: Not on file    Active member of club or organization: Not on file    Attends meetings of clubs or organizations: Not on file    Relationship status: Not on file  . Intimate partner violence:  Fear of current or ex partner: Not on file    Emotionally abused: Not on file    Physically abused: Not on file    Forced sexual activity: Not on file  Other Topics Concern  . Not on file  Social History Narrative  . Not on file    Physical Exam  Constitutional: He is oriented to person, place, and time.  Cardiovascular: Normal rate and regular rhythm.  Pulmonary/Chest: Effort normal and breath sounds normal.  Abdominal: Soft.  Musculoskeletal: Normal range of motion. He exhibits no edema.  Neurological: He is alert and oriented to person, place, and time.  Skin: Skin is warm and dry.  Psychiatric: He has a normal mood and affect.        Future Appointments  Date Time Provider Department Center  04/22/2018  1:15 PM [facility] None  04/24/2018  1:15 PM [facility] None  04/27/2018   6:45 AM [facility] None  04/27/2018  1:30 PM [facility] LAB [facility] None  04/27/2018  1:45 PM Newlin, Enobong, MD [facility] None  04/29/2018  1:15 PM [facility] None  05/01/2018  1:15 PM [facility] None  05/04/2018  1:15 PM [facility] None  05/06/2018  1:15 PM [facility] None  05/08/2018  1:15 PM [facility] None  05/11/2018  1:15 PM [facility] None  05/13/2018  1:15 PM [facility] None  05/15/2018  1:15 PM [facility] None  05/18/2018  1:15 PM [facility] None  05/20/2018  1:15 PM [facility] None  05/22/2018  1:15 PM [facility] None  05/27/2018  1:15 PM [facility] None  05/29/2018  1:15 PM [facility] None  06/18/2018  1:40 PM Bensimhon, Bevelyn Buckles, MD [facility] None    BP 94/76 (BP Location: Left Arm, Patient Position: Sitting, Cuff Size: Large)   Pulse 80   Resp 16   Wt 278 lb (126.1 kg)   SpO2 98%   BMI 34.75 kg/m   Weight yesterday- 278 lb Last visit weight- 280 lb  Mr Mccowan was seen this morning at home and reported feeling well. He stated he maintains having mild SOB upon waking up and intermittent headaches but denied orthopnea. He is awaiting a call from the clinic to schedule an EP visit. He reports following a low sodium diet and working out regularly. His medications were verified and his pillbox was refilled.   Time spent with patient: 31 minutes  Jacqualine Code, EMT 04/22/18  ACTION: Home visit completed Next visit planned for 1 week

## 2018-04-24 ENCOUNTER — Encounter (HOSPITAL_COMMUNITY)
Admission: RE | Admit: 2018-04-24 | Discharge: 2018-04-24 | Disposition: A | Discharge status: Home / Self Care | Payer: Self-pay | Source: Ambulatory Visit | Attending: Internal Medicine | Admitting: Internal Medicine

## 2018-04-27 ENCOUNTER — Encounter: Payer: Self-pay | Admitting: Family Medicine

## 2018-04-27 ENCOUNTER — Encounter (HOSPITAL_COMMUNITY): Payer: Self-pay

## 2018-04-27 ENCOUNTER — Ambulatory Visit (HOSPITAL_COMMUNITY)
Admission: RE | Admit: 2018-04-27 | Discharge: 2018-04-27 | Disposition: A | Discharge status: Home / Self Care | Payer: Self-pay | Source: Ambulatory Visit | Attending: Cardiology | Admitting: Cardiology

## 2018-04-27 ENCOUNTER — Telehealth: Payer: Self-pay | Admitting: Licensed Clinical Social Worker

## 2018-04-27 ENCOUNTER — Ambulatory Visit: Payer: Self-pay | Attending: Family Medicine | Admitting: Family Medicine

## 2018-04-27 VITALS — BP 88/58 | HR 69 | Temp 97.9°F | Ht 75.0 in | Wt 289.4 lb

## 2018-04-27 DIAGNOSIS — Z955 Presence of coronary angioplasty implant and graft: Secondary | ICD-10-CM | POA: Insufficient documentation

## 2018-04-27 DIAGNOSIS — Z9889 Other specified postprocedural states: Secondary | ICD-10-CM | POA: Insufficient documentation

## 2018-04-27 DIAGNOSIS — Z7902 Long term (current) use of antithrombotics/antiplatelets: Secondary | ICD-10-CM | POA: Insufficient documentation

## 2018-04-27 DIAGNOSIS — Z7982 Long term (current) use of aspirin: Secondary | ICD-10-CM | POA: Insufficient documentation

## 2018-04-27 DIAGNOSIS — I2510 Atherosclerotic heart disease of native coronary artery without angina pectoris: Secondary | ICD-10-CM | POA: Insufficient documentation

## 2018-04-27 DIAGNOSIS — I255 Ischemic cardiomyopathy: Secondary | ICD-10-CM | POA: Insufficient documentation

## 2018-04-27 DIAGNOSIS — J45909 Unspecified asthma, uncomplicated: Secondary | ICD-10-CM | POA: Insufficient documentation

## 2018-04-27 DIAGNOSIS — I5022 Chronic systolic (congestive) heart failure: Secondary | ICD-10-CM | POA: Insufficient documentation

## 2018-04-27 DIAGNOSIS — Z79899 Other long term (current) drug therapy: Secondary | ICD-10-CM | POA: Insufficient documentation

## 2018-04-27 LAB — BASIC METABOLIC PANEL
Anion gap: 10 (ref 5–15)
BUN: 12 mg/dL (ref 6–20)
CO2: 23 mmol/L (ref 22–32)
CREATININE: 0.88 mg/dL (ref 0.61–1.24)
Calcium: 9.5 mg/dL (ref 8.9–10.3)
Chloride: 103 mmol/L (ref 101–111)
GFR calc Af Amer: >60 mL/min (ref >60)
Glucose, Bld: 102 mg/dL — ABNORMAL HIGH (ref 65–99)
POTASSIUM: 4.2 mmol/L (ref 3.5–5.1)
SODIUM: 136 mmol/L (ref 135–145)

## 2018-04-27 NOTE — Telephone Encounter (Signed)
CSW received call from patient stating he followed up at [facility] financial counselor and was unable to speak directly as the counselor was not available but was informed that he does not have the [facility] Discount. Patient states he has a family member who is a Child psychotherapist and plans to assist him with a Personal assistant. CSW encouraged patient to apply for deductible Medicaid and follow up as needed for further help. Patient verbalizes understanding and will follow up with CSW as needed. CSW available as needed. Lasandra Beech, LCSW, CCSW-MCS [phone]

## 2018-04-27 NOTE — Progress Notes (Signed)
Subjective:  Patient ID: Sergio Hicks, male    DOB: [birth year]  Age: 37 y.o. MRN: [id number]  CC: Cardiomyopathy  HPI Sergio Hicks  is a 37 year old male here for follow-up visit.  Medical history is significant for CAD (status post cardiac cath with placement of DES to proximal/mid LAD, currently on dual antiplatelet therapy with Plavix and aspirin), ischemic cardiomyopathy (EF 30-35% from echo of 03/2018)  He currently wears a LifeVest and was seen by cardiology, Dr. Gala Romney on 04/15/2018 at which time the dose of his Sherryll Burger was increased and he was at the cardiology office today to have labs done.  Plan is for ICD in the near future and repeat echocardiogram in 3 months.  The patient informs me today he has no chest pains, no shortness of breath and no pedal edema and states he was informed by at the Cardiology office today, he would need some form of insurance prior to the ICD placement.  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 He has 2 soft blood pressure today but denies  Lightheadedness.  Echocardiogram 04/15/2018: Study Conclusions  - Left ventricle: The cavity size was mildly dilated. Systolic   function was moderately to severely reduced. The estimated   ejection fraction was in the range of 30% to 35%. There is   akinesis of the apicalanterior, lateral, inferolateral, inferior,   and apical myocardium. The study is not technically sufficient to   allow evaluation of LV diastolic function. - Mitral valve: There was mild to moderate regurgitation directed   eccentrically and toward the free wall. - Left atrium: The atrium was mildly dilated  Past Medical History:  Diagnosis Date  . Asthma     Past Surgical History:  Procedure Laterality Date  . CORONARY STENT INTERVENTION N/A 01/20/2018   Procedure: CORONARY STENT INTERVENTION;  Surgeon: Swaziland, Peter M, MD;  Location: [facility] INVASIVE CV LAB;  Service: Cardiovascular;  Laterality: N/A;    . LEFT HEART CATH AND CORONARY ANGIOGRAPHY N/A 01/18/2018   Procedure: LEFT HEART CATH AND CORONARY ANGIOGRAPHY;  Surgeon: Runell Gess, MD;  Location: [facility] INVASIVE CV LAB;  Service: Cardiovascular;  Laterality: N/A;    No Known Allergies   Outpatient Medications Prior to Visit  Medication Sig Dispense Refill  . albuterol (PROVENTIL HFA;VENTOLIN HFA) 108 (90 Base) MCG/ACT inhaler Inhale 1-2 puffs into the lungs every 6 (six) hours as needed for wheezing or shortness of breath.    Marland Kitchen aspirin EC 81 MG EC tablet Take 1 tablet (81 mg total) by mouth daily. 30 tablet 6  . atorvastatin (LIPITOR) 80 MG tablet Take 1 tablet (80 mg total) by mouth daily at 6 PM. 30 tablet 6  . carvedilol (COREG) 6.25 MG tablet Take 1 tablet (6.25 mg total) by mouth 2 (two) times daily. 60 tablet 2  . clopidogrel (PLAVIX) 75 MG tablet Take 1 tablet (75 mg total) by mouth daily with breakfast. 30 tablet 6  . furosemide (LASIX) 40 MG tablet Take 1 tablet (40 mg total) by mouth as needed. 30 tablet   . potassium chloride SA (K-DUR,KLOR-CON) 20 MEQ tablet Take 40 mEq by mouth as needed. Only on days when you need to take furosemide    . sacubitril-valsartan (ENTRESTO) 49-51 MG Take 1 tablet by mouth 2 (two) times daily. 60 tablet 3  . spironolactone (ALDACTONE) 25 MG tablet Take 1 tablet (25 mg total) by mouth daily. 30 tablet 2  .  nitroGLYCERIN (NITROSTAT) 0.4 MG SL tablet Place 1 tablet (0.4 mg total) under the tongue every 5 (five) minutes as needed for chest pain. (Patient not taking: Reported on 04/22/2018) 60 tablet 0   No facility-administered medications prior to visit.     ROS Review of Systems  Constitutional: Negative for activity change and appetite change.  HENT: Negative for sinus pressure and sore throat.   Eyes: Negative for visual disturbance.  Respiratory: Negative for cough, chest tightness and shortness of breath.   Cardiovascular: Negative for chest pain and leg swelling.  Gastrointestinal:  Negative for abdominal distention, abdominal pain, constipation and diarrhea.  Endocrine: Negative.   Genitourinary: Negative for dysuria.  Musculoskeletal: Negative for joint swelling and myalgias.  Skin: Negative for rash.  Allergic/Immunologic: Negative.   Neurological: Negative for weakness, light-headedness and numbness.  Psychiatric/Behavioral: Negative for dysphoric mood and suicidal ideas.    Objective:  BP (!) 88/58   Pulse 69   Temp 97.9 F (36.6 C) (Oral)   Ht 6\' 3"  (1.905 m)   Wt 289 lb 6.4 oz (131.3 kg)   SpO2 98%   BMI 36.17 kg/m   BP/Weight 04/27/2018 04/22/2018 04/17/2018  Systolic BP 88 94 96  Diastolic BP 58 76 80  Wt. (Lbs) 289.4 278 280  BMI 36.17 34.75 35      Physical Exam  Constitutional: He is oriented to person, place, and time. He appears well-developed and well-nourished.  Cardiovascular: Normal rate, normal heart sounds and intact distal pulses.  No murmur heard. Life Vest in place  Pulmonary/Chest: Effort normal and breath sounds normal. He has no wheezes. He has no rales. He exhibits no tenderness.  Abdominal: Soft. Bowel sounds are normal. He exhibits no distension and no mass. There is no tenderness.  Musculoskeletal: Normal range of motion.  Neurological: He is alert and oriented to person, place, and time.  Skin: Skin is warm and dry.  Psychiatric: He has a normal mood and affect.     Assessment & Plan:   1. Coronary artery disease involving native heart without angina pectoris, unspecified vessel or lesion type Status post drug-eluting stent to proximal and mid LAD Asymptomatic Currently on  dual antiplatelet therapy with Plavix and aspirin  2. Ischemic cardiomyopathy EF of 30 to 35% from echo 03/2018, euvolemic. Currently awaiting ICD placement (Needs [place] discount versus insurance per Cardiology) We will check with the financial counselor regarding the status of his application for the colon financial discount Continue  Life Vest Daily weights, fluid restriction    No orders of the defined types were placed in this encounter.   Follow-up: Return in about 3 months (around 07/27/2018) for follow up of chronic medical conditions.   Hoy Register MD

## 2018-04-27 NOTE — Telephone Encounter (Signed)
Pt aware of results, he currently has no insurance.  He is going to [facility] when he leaves here and is going to discuss w/them, he isn't sure if they started Medicaid application or not.  Lasandra Beech, CSW also spoke w/pt, he will f/u with [place] after his appt at [facility] today.

## 2018-04-28 ENCOUNTER — Telehealth (HOSPITAL_COMMUNITY): Payer: Self-pay

## 2018-04-29 ENCOUNTER — Encounter (HOSPITAL_COMMUNITY): Payer: Self-pay

## 2018-04-29 DIAGNOSIS — I2129 ST elevation (STEMI) myocardial infarction involving other sites: Secondary | ICD-10-CM | POA: Insufficient documentation

## 2018-04-29 DIAGNOSIS — Z955 Presence of coronary angioplasty implant and graft: Secondary | ICD-10-CM | POA: Insufficient documentation

## 2018-04-29 NOTE — Telephone Encounter (Signed)
I called Sergio Hicks to schedule an appointment. He did not answer so I left a voicemail requesting he call me back.

## 2018-04-30 ENCOUNTER — Telehealth (HOSPITAL_COMMUNITY): Payer: Self-pay

## 2018-04-30 NOTE — Telephone Encounter (Signed)
I called Mr Otterbein to schedule an appointment. He stated he would not be able to meet today but asked if tomorrow would work. We agreed to meet at 10:00 tomorrow morning.

## 2018-05-01 ENCOUNTER — Encounter (HOSPITAL_COMMUNITY)
Admission: RE | Admit: 2018-05-01 | Discharge: 2018-05-01 | Disposition: A | Discharge status: Home / Self Care | Payer: Self-pay | Source: Ambulatory Visit | Attending: Internal Medicine | Admitting: Internal Medicine

## 2018-05-01 ENCOUNTER — Other Ambulatory Visit (HOSPITAL_COMMUNITY): Payer: Self-pay

## 2018-05-01 NOTE — Progress Notes (Signed)
Paramedicine Encounter    Patient ID: Shayan Kowitz, male    DOB: [birth year], 37 y.o.   MRN: [id number]   Patient Care Team: Hoy Register, MD as PCP - General (Family Medicine)  Patient Active Problem List   Diagnosis Date Noted  . CAD (coronary artery disease) 04/27/2018  . Obesity 01/26/2018  . ACS (acute coronary syndrome) (HCC) 01/18/2018  . Heart attack (HCC)   . Ischemic cardiomyopathy     Current Outpatient Medications:  .  albuterol (PROVENTIL HFA;VENTOLIN HFA) 108 (90 Base) MCG/ACT inhaler, Inhale 1-2 puffs into the lungs every 6 (six) hours as needed for wheezing or shortness of breath., Disp: , Rfl:  .  aspirin EC 81 MG EC tablet, Take 1 tablet (81 mg total) by mouth daily., Disp: 30 tablet, Rfl: 6 .  atorvastatin (LIPITOR) 80 MG tablet, Take 1 tablet (80 mg total) by mouth daily at 6 PM., Disp: 30 tablet, Rfl: 6 .  carvedilol (COREG) 6.25 MG tablet, Take 1 tablet (6.25 mg total) by mouth 2 (two) times daily., Disp: 60 tablet, Rfl: 2 .  clopidogrel (PLAVIX) 75 MG tablet, Take 1 tablet (75 mg total) by mouth daily with breakfast., Disp: 30 tablet, Rfl: 6 .  furosemide (LASIX) 40 MG tablet, Take 1 tablet (40 mg total) by mouth as needed., Disp: 30 tablet, Rfl:  .  potassium chloride SA (K-DUR,KLOR-CON) 20 MEQ tablet, Take 40 mEq by mouth as needed. Only on days when you need to take furosemide, Disp: , Rfl:  .  sacubitril-valsartan (ENTRESTO) 49-51 MG, Take 1 tablet by mouth 2 (two) times daily., Disp: 60 tablet, Rfl: 3 .  spironolactone (ALDACTONE) 25 MG tablet, Take 1 tablet (25 mg total) by mouth daily., Disp: 30 tablet, Rfl: 2 .  nitroGLYCERIN (NITROSTAT) 0.4 MG SL tablet, Place 1 tablet (0.4 mg total) under the tongue every 5 (five) minutes as needed for chest pain. (Patient not taking: Reported on 04/22/2018), Disp: 60 tablet, Rfl: 0 No Known Allergies    Social History   Socioeconomic History  . Marital status: Single    Spouse name: Not on file  . Number of  children: Not on file  . Years of education: Not on file  . Highest education level: Not on file  Occupational History  . Occupation: Investment banker, operational  Social Needs  . Financial resource strain: Not on file  . Food insecurity:    Worry: Not on file    Inability: Not on file  . Transportation needs:    Medical: Not on file    Non-medical: Not on file  Tobacco Use  . Smoking status: Former Smoker    Packs/day: 0.00    Types: Cigarettes  . Smokeless tobacco: Never Used  Substance and Sexual Activity  . Alcohol use: Yes    Alcohol/week: 6.0 oz    Types: 10 Standard drinks or equivalent per week  . Drug use: Not on file  . Sexual activity: Not on file  Lifestyle  . Physical activity:    Days per week: Not on file    Minutes per session: Not on file  . Stress: Not on file  Relationships  . Social connections:    Talks on phone: Not on file    Gets together: Not on file    Attends religious service: Not on file    Active member of club or organization: Not on file    Attends meetings of clubs or organizations: Not on file    Relationship status: Not on  file  . Intimate partner violence:    Fear of current or ex partner: Not on file    Emotionally abused: Not on file    Physically abused: Not on file    Forced sexual activity: Not on file  Other Topics Concern  . Not on file  Social History Narrative  . Not on file    Physical Exam  Constitutional: He is oriented to person, place, and time.  Neck: No JVD present.  Cardiovascular: Normal rate and regular rhythm.  Pulmonary/Chest: Effort normal and breath sounds normal.  Abdominal: Soft. Bowel sounds are normal.  Musculoskeletal: Normal range of motion. He exhibits no edema.  Neurological: He is alert and oriented to person, place, and time.  Skin: Skin is warm and dry.  Psychiatric: He has a normal mood and affect.        Future Appointments  Date Time Provider Department Center  05/01/2018  1:15 PM [facility]  MAINTENANCE [facility] None  05/04/2018  1:15 PM [facility] MAINTENANCE [facility] None  05/06/2018  1:15 PM [facility] MAINTENANCE [facility] None  05/08/2018  1:15 PM [facility] MAINTENANCE [facility] None  05/11/2018  1:15 PM [facility] MA[facility]TENANCE [facility] None  05/13/2018  1:15 PM [facility] MAINTENANCE [facility] None  05/15/2018  1:15 PM [facility] MAINTENANCE [facility] None  05/18/2018  1:15 PM [facility] MAINTENANCE [facility] None  05/20/2018  1:15 PM [facility] MAINTENANCE [facility] None  05/22/2018  1:15 PM [facility] MAINTENANCE [facility] None  05/27/2018  1:15 PM [facility] MAINTENANCE [facility] None  05/29/2018  1:15 PM [facility] MAINTENANCE [facility] None  06/18/2018  1:40 PM Bensimhon, Bevelyn Buckles, MD [facility] None  07/27/2018  2:50 PM Hoy Register, MD [facility] None    BP 92/70 (BP Location: Right Arm, Patient Position: Sitting, Cuff Size: Large)   Pulse 78   Resp 16   Wt 277 lb (125.6 kg)   SpO2 98%   BMI 34.62 kg/m   Weight yesterday- 277.8 lb Last visit weight- 278 lb   Mr Vitelli was seen at home today and reported feeling generally well. He denied SOB, headache, dizziness or orthopnea. He reported being compliant with his medications, which were verified and his pillbox was refilled. He is stressed out about his mounting medical bills and recently learned that the person at [facility] that he thought was working on his disability information is not longer working there and did not do anything with his case. He reported that he turned in his disability application on Tuesday this week and asked that I let the clinic staff know. I asked if he has applied for Medicaid and he said that he was told that he can't get that until his disability goes through. I will follow up on this to confirm.   Time spent with patient: 26 minutes   Jacqualine Code, EMT 05/01/18  ACTION: Home visit completed Next visit planned  for 1 week

## 2018-05-04 ENCOUNTER — Encounter (HOSPITAL_COMMUNITY)
Admission: RE | Admit: 2018-05-04 | Discharge: 2018-05-04 | Disposition: A | Discharge status: Home / Self Care | Payer: Self-pay | Source: Ambulatory Visit | Attending: Internal Medicine | Admitting: Internal Medicine

## 2018-05-06 ENCOUNTER — Encounter (HOSPITAL_COMMUNITY)
Admission: RE | Admit: 2018-05-06 | Discharge: 2018-05-06 | Disposition: A | Discharge status: Home / Self Care | Payer: Self-pay | Source: Ambulatory Visit | Attending: Internal Medicine | Admitting: Internal Medicine

## 2018-05-07 ENCOUNTER — Other Ambulatory Visit (HOSPITAL_COMMUNITY): Payer: Self-pay

## 2018-05-07 NOTE — Progress Notes (Signed)
Paramedicine Encounter    Patient ID: Sergio Hicks, male    DOB: 12-04-1981, 37 y.o.   MRN: [id number]   Patient Care Team: Hoy Register, MD as PCP - General (Family Medicine)  Patient Active Problem List   Diagnosis Date Noted  . CAD (coronary artery disease) 04/27/2018  . Obesity 01/26/2018  . ACS (acute coronary syndrome) (HCC) 01/18/2018  . Heart attack (HCC)   . Ischemic cardiomyopathy     Current Outpatient Medications:  .  albuterol (PROVENTIL HFA;VENTOLIN HFA) 108 (90 Base) MCG/ACT inhaler, Inhale 1-2 puffs into the lungs every 6 (six) hours as needed for wheezing or shortness of breath., Disp: , Rfl:  .  aspirin EC 81 MG EC tablet, Take 1 tablet (81 mg total) by mouth daily., Disp: 30 tablet, Rfl: 6 .  atorvastatin (LIPITOR) 80 MG tablet, Take 1 tablet (80 mg total) by mouth daily at 6 PM., Disp: 30 tablet, Rfl: 6 .  carvedilol (COREG) 6.25 MG tablet, Take 1 tablet (6.25 mg total) by mouth 2 (two) times daily., Disp: 60 tablet, Rfl: 2 .  clopidogrel (PLAVIX) 75 MG tablet, Take 1 tablet (75 mg total) by mouth daily with breakfast., Disp: 30 tablet, Rfl: 6 .  furosemide (LASIX) 40 MG tablet, Take 1 tablet (40 mg total) by mouth as needed., Disp: 30 tablet, Rfl:  .  nitroGLYCERIN (NITROSTAT) 0.4 MG SL tablet, Place 1 tablet (0.4 mg total) under the tongue every 5 (five) minutes as needed for chest pain. (Patient not taking: Reported on 04/22/2018), Disp: 60 tablet, Rfl: 0 .  potassium chloride SA (K-DUR,KLOR-CON) 20 MEQ tablet, Take 40 mEq by mouth as needed. Only on days when you need to take furosemide, Disp: , Rfl:  .  sacubitril-valsartan (ENTRESTO) 49-51 MG, Take 1 tablet by mouth 2 (two) times daily., Disp: 60 tablet, Rfl: 3 .  spironolactone (ALDACTONE) 25 MG tablet, Take 1 tablet (25 mg total) by mouth daily., Disp: 30 tablet, Rfl: 2 No Known Allergies    Social History   Socioeconomic History  . Marital status: Single    Spouse name: Not on file  . Number of  children: Not on file  . Years of education: Not on file  . Highest education level: Not on file  Occupational History  . Occupation: Investment banker, operational  Social Needs  . Financial resource strain: Not on file  . Food insecurity:    Worry: Not on file    Inability: Not on file  . Transportation needs:    Medical: Not on file    Non-medical: Not on file  Tobacco Use  . Smoking status: Former Smoker    Packs/day: 0.00    Types: Cigarettes  . Smokeless tobacco: Never Used  Substance and Sexual Activity  . Alcohol use: Yes    Alcohol/week: 6.0 oz    Types: 10 Standard drinks or equivalent per week  . Drug use: Not on file  . Sexual activity: Not on file  Lifestyle  . Physical activity:    Days per week: Not on file    Minutes per session: Not on file  . Stress: Not on file  Relationships  . Social connections:    Talks on phone: Not on file    Gets together: Not on file    Attends religious service: Not on file    Active member of club or organization: Not on file    Attends meetings of clubs or organizations: Not on file    Relationship status: Not on  file  . Intimate partner violence:    Fear of current or ex partner: Not on file    Emotionally abused: Not on file    Physically abused: Not on file    Forced sexual activity: Not on file  Other Topics Concern  . Not on file  Social History Narrative  . Not on file    Physical Exam  Constitutional: He is oriented to person, place, and time.  Neck: No JVD present.  Cardiovascular: Normal rate and regular rhythm.  Pulmonary/Chest: Effort normal and breath sounds normal.  Abdominal: Soft.  Musculoskeletal: Normal range of motion. He exhibits no edema.  Neurological: He is alert and oriented to person, place, and time.  Skin: Skin is warm and dry.  Psychiatric: He has a normal mood and affect.        Future Appointments  Date Time Provider Department Center  05/08/2018  1:15 PM [facility] MAINTENANCE [facility] None   05/11/2018  1:15 PM [facility] MA[facility]TENANCE [facility] None  05/13/2018  1:15 PM [facility] MAINTENANCE [facility] None  05/15/2018  1:15 PM [facility] MAINTENANCE [facility] None  05/18/2018  1:15 PM [facility] MAINTENANCE [facility] None  05/20/2018  1:15 PM [facility] MAINTENANCE [facility] None  05/22/2018  1:15 PM [facility] MAINTENANCE [facility] None  05/27/2018  1:15 PM [facility] MAINTENANCE [facility] None  05/29/2018  1:15 PM [facility] MAINTENANCE [facility] None  06/18/2018  1:40 PM Bensimhon, Bevelyn Buckles, MD [facility] None  07/27/2018  2:50 PM Hoy Register, MD [facility] None    There were no vitals taken for this visit.  Weight yesterday- 277 lb Last visit weight- 277 lb  Sergio Hicks was seen at home today and reported feeling well. He was having some shoulder discomfort but was familiar with this pain and said it was from an old clavicular injury. He reported being compliant with his medications and denied SOB, headache, dizziness or orthopnea. He is frustrated about having to wait for disability to be process to have his ICD placed. I advised him to go ahead and apply for Medicaid and gave him the phone numbers to call and read to him the requirements which he meets. He stated that he did not think he would be able to get Medicaid because someone he knows wasn't able to get it. I explained that he does not know what the actually situation surrounding that person and he will only know about his case if he makes the phone call. He said he would call but seemed indifferent to the idea and like he just wanted to wait on the disability decision.   Time spent with patient: 34 minutes  Jacqualine Code, EMT 05/07/18  ACTION: Home visit completed Next visit planned for 1 week

## 2018-05-08 ENCOUNTER — Encounter (HOSPITAL_COMMUNITY)
Admission: RE | Admit: 2018-05-08 | Discharge: 2018-05-08 | Disposition: A | Discharge status: Home / Self Care | Payer: Self-pay | Source: Ambulatory Visit | Attending: Internal Medicine | Admitting: Internal Medicine

## 2018-05-11 ENCOUNTER — Encounter (HOSPITAL_COMMUNITY): Payer: Self-pay

## 2018-05-13 ENCOUNTER — Encounter (HOSPITAL_COMMUNITY)
Admission: RE | Admit: 2018-05-13 | Discharge: 2018-05-13 | Disposition: A | Discharge status: Home / Self Care | Payer: Self-pay | Source: Ambulatory Visit | Attending: Internal Medicine | Admitting: Internal Medicine

## 2018-05-14 ENCOUNTER — Telehealth (HOSPITAL_COMMUNITY): Payer: Self-pay

## 2018-05-14 NOTE — Telephone Encounter (Signed)
I called Mr Lohse to schedule an appointment. He did not answer so I left a message requesting he call me back.

## 2018-05-15 ENCOUNTER — Telehealth (HOSPITAL_COMMUNITY): Payer: Self-pay

## 2018-05-15 ENCOUNTER — Encounter (HOSPITAL_COMMUNITY)
Admission: RE | Admit: 2018-05-15 | Discharge: 2018-05-15 | Disposition: A | Discharge status: Home / Self Care | Payer: Self-pay | Source: Ambulatory Visit | Attending: Internal Medicine | Admitting: Internal Medicine

## 2018-05-15 MED FILL — ATORVASTATIN 80 MG TABLET: 80 | 30 days supply | Qty: 30 | Fill #4

## 2018-05-15 NOTE — Telephone Encounter (Signed)
Mr Hondros returned my called today and advised that he has been out of town and just got back yesterday. He said he was tired and forgot to call me yesterday but would not be able to meet today because he had other plans. We agreed to touch base next week and schedule a time then.

## 2018-05-18 ENCOUNTER — Other Ambulatory Visit (HOSPITAL_COMMUNITY): Payer: Self-pay

## 2018-05-18 ENCOUNTER — Encounter (HOSPITAL_COMMUNITY): Payer: Self-pay

## 2018-05-18 MED FILL — CARVEDILOL 6.25 MG TABLET: 6.25 | 34 days supply | Qty: 68 | Fill #2

## 2018-05-18 MED FILL — CLOPIDOGREL 75 MG TABLET: 75 | 30 days supply | Qty: 30 | Fill #4

## 2018-05-18 NOTE — Progress Notes (Signed)
Paramedicine Encounter    Patient ID: Sergio Hicks, male    DOB: 03-08-1981, 37 y.o.   MRN: [id number]   Patient Care Team: Hoy Register, MD as PCP - General (Family Medicine)  Patient Active Problem List   Diagnosis Date Noted  . CAD (coronary artery disease) 04/27/2018  . Obesity 01/26/2018  . ACS (acute coronary syndrome) (HCC) 01/18/2018  . Heart attack (HCC)   . Ischemic cardiomyopathy     Current Outpatient Medications:  .  albuterol (PROVENTIL HFA;VENTOLIN HFA) 108 (90 Base) MCG/ACT inhaler, Inhale 1-2 puffs into the lungs every 6 (six) hours as needed for wheezing or shortness of breath., Disp: , Rfl:  .  aspirin EC 81 MG EC tablet, Take 1 tablet (81 mg total) by mouth daily., Disp: 30 tablet, Rfl: 6 .  atorvastatin (LIPITOR) 80 MG tablet, Take 1 tablet (80 mg total) by mouth daily at 6 PM., Disp: 30 tablet, Rfl: 6 .  carvedilol (COREG) 6.25 MG tablet, Take 1 tablet (6.25 mg total) by mouth 2 (two) times daily., Disp: 60 tablet, Rfl: 2 .  clopidogrel (PLAVIX) 75 MG tablet, Take 1 tablet (75 mg total) by mouth daily with breakfast., Disp: 30 tablet, Rfl: 6 .  furosemide (LASIX) 40 MG tablet, Take 1 tablet (40 mg total) by mouth as needed., Disp: 30 tablet, Rfl:  .  potassium chloride SA (K-DUR,KLOR-CON) 20 MEQ tablet, Take 40 mEq by mouth as needed. Only on days when you need to take furosemide, Disp: , Rfl:  .  sacubitril-valsartan (ENTRESTO) 49-51 MG, Take 1 tablet by mouth 2 (two) times daily., Disp: 60 tablet, Rfl: 3 .  spironolactone (ALDACTONE) 25 MG tablet, Take 1 tablet (25 mg total) by mouth daily., Disp: 30 tablet, Rfl: 2 .  nitroGLYCERIN (NITROSTAT) 0.4 MG SL tablet, Place 1 tablet (0.4 mg total) under the tongue every 5 (five) minutes as needed for chest pain. (Patient not taking: Reported on 04/22/2018), Disp: 60 tablet, Rfl: 0 No Known Allergies    Social History   Socioeconomic History  . Marital status: Single    Spouse name: Not on file  . Number of  children: Not on file  . Years of education: Not on file  . Highest education level: Not on file  Occupational History  . Occupation: Investment banker, operational  Social Needs  . Financial resource strain: Not on file  . Food insecurity:    Worry: Not on file    Inability: Not on file  . Transportation needs:    Medical: Not on file    Non-medical: Not on file  Tobacco Use  . Smoking status: Former Smoker    Packs/day: 0.00    Types: Cigarettes  . Smokeless tobacco: Never Used  Substance and Sexual Activity  . Alcohol use: Yes    Alcohol/week: 6.0 oz    Types: 10 Standard drinks or equivalent per week  . Drug use: Not on file  . Sexual activity: Not on file  Lifestyle  . Physical activity:    Days per week: Not on file    Minutes per session: Not on file  . Stress: Not on file  Relationships  . Social connections:    Talks on phone: Not on file    Gets together: Not on file    Attends religious service: Not on file    Active member of club or organization: Not on file    Attends meetings of clubs or organizations: Not on file    Relationship status: Not on  file  . Intimate partner violence:    Fear of current or ex partner: Not on file    Emotionally abused: Not on file    Physically abused: Not on file    Forced sexual activity: Not on file  Other Topics Concern  . Not on file  Social History Narrative  . Not on file    Physical Exam  Constitutional: He is oriented to person, place, and time.  Cardiovascular: Normal rate and regular rhythm.  Pulmonary/Chest: Effort normal and breath sounds normal.  Abdominal: Soft. Bowel sounds are normal.  Musculoskeletal: Normal range of motion. He exhibits no edema.  Neurological: He is alert and oriented to person, place, and time.  Skin: Skin is warm and dry.  Psychiatric: He has a normal mood and affect.        Future Appointments  Date Time Provider Department Center  05/18/2018  1:15 PM [facility] None   05/20/2018  1:15 PM [facility] None  05/22/2018  1:15 PM [facility] None  05/27/2018  1:15 PM [facility] None  05/29/2018  1:15 PM [facility] None  06/18/2018  1:40 PM Bensimhon, Bevelyn Buckles, MD [facility] None  07/27/2018  2:50 PM Hoy Register, MD [facility] None    BP 106/70 (BP Location: Left Arm, Patient Position: Sitting, Cuff Size: Large)   Pulse 74   Resp 16   Wt 284 lb 6.3 oz (129 kg)   SpO2 98%   BMI 35.55 kg/m   Weight yesterday- 284 lb Last visit weight- 277 lb  Sergio Hicks was seen at home today and reported feeling well, physically. He advised that he was "on the verge of depression" and said that if something does not change "soon" he will spiral into drugs and alcohol abuse. He stated he has been in that position before and knows that it is not safe but that is his pattern of behavior. I explained that these things take time and if he felt like he could go back to work, then he should talk to the clinic providers and get their opinion. He said he would "do whatever [he] needs to do" so it wouldn't matter when the clinic said. I suggested that he look for a job with benefits but he said that he didn't want to start a job and then have to take time off for a surgery or doctor's appointment because "that's just not the way [he] does things." I will have Annice Pih speak to him about his state of mind and see what kind of assistance she can provide. He also said he does not want to wear his life vest anymore and wants to send it back. Again I suggested he speak to the clinic about this. His medications were verified and his pillbox was refilled. I ordered Plavix and carvedilol from the pharmacy and advised him to pick up those medications as well as aspirin by Friday.  Time spent with patient: 20 minutes  Jacqualine Code, EMT 05/18/18  ACTION: Home visit completed Next visit planned  for 1 week

## 2018-05-20 ENCOUNTER — Encounter (HOSPITAL_COMMUNITY): Payer: Self-pay

## 2018-05-20 ENCOUNTER — Telehealth: Payer: Self-pay | Admitting: Licensed Clinical Social Worker

## 2018-05-20 NOTE — Telephone Encounter (Signed)
CSW contacted patient to follow up on status of disability and medicaid. Message left for return call. Lasandra Beech, LCSW, CCSW-MCS [phone]

## 2018-05-22 ENCOUNTER — Encounter (HOSPITAL_COMMUNITY)
Admission: RE | Admit: 2018-05-22 | Discharge: 2018-05-22 | Disposition: A | Discharge status: Home / Self Care | Payer: Self-pay | Source: Ambulatory Visit | Attending: Internal Medicine | Admitting: Internal Medicine

## 2018-05-27 ENCOUNTER — Encounter (HOSPITAL_COMMUNITY)
Admission: RE | Admit: 2018-05-27 | Discharge: 2018-05-27 | Disposition: A | Discharge status: Home / Self Care | Payer: Self-pay | Source: Ambulatory Visit | Attending: Internal Medicine | Admitting: Internal Medicine

## 2018-05-29 ENCOUNTER — Encounter (HOSPITAL_COMMUNITY)
Admission: RE | Admit: 2018-05-29 | Discharge: 2018-05-29 | Disposition: A | Discharge status: Home / Self Care | Payer: Self-pay | Source: Ambulatory Visit | Attending: Internal Medicine | Admitting: Internal Medicine

## 2018-06-01 ENCOUNTER — Encounter (HOSPITAL_COMMUNITY)
Admission: RE | Admit: 2018-06-01 | Discharge: 2018-06-01 | Disposition: A | Discharge status: Home / Self Care | Payer: Self-pay | Source: Ambulatory Visit | Attending: Internal Medicine | Admitting: Internal Medicine

## 2018-06-01 DIAGNOSIS — I2129 ST elevation (STEMI) myocardial infarction involving other sites: Secondary | ICD-10-CM | POA: Insufficient documentation

## 2018-06-01 DIAGNOSIS — Z955 Presence of coronary angioplasty implant and graft: Secondary | ICD-10-CM | POA: Insufficient documentation

## 2018-06-02 ENCOUNTER — Telehealth (HOSPITAL_COMMUNITY): Payer: Self-pay

## 2018-06-03 NOTE — Telephone Encounter (Signed)
I called Sergio Hicks to schedule an appointment. He stated that he was busy today and would have to call me back to set up an appointment because he was unsure of his schedule this week.

## 2018-06-05 ENCOUNTER — Telehealth (HOSPITAL_COMMUNITY): Payer: Self-pay

## 2018-06-05 NOTE — Telephone Encounter (Signed)
I called Sergio Hicks after not hearing back from him over the past two days. He did not answer so I left a voicemail requesting he call me back to schedule an appointment.

## 2018-06-08 MED FILL — SPIRONOLACTONE 25 MG TABLET: 25 | 30 days supply | Qty: 30 | Fill #1

## 2018-06-08 MED FILL — FUROSEMIDE 40 MG TAB: 40 | 100 days supply | Qty: 100 | Fill #1

## 2018-06-09 ENCOUNTER — Telehealth (HOSPITAL_COMMUNITY): Payer: Self-pay

## 2018-06-10 NOTE — Telephone Encounter (Signed)
Sergio Hicks was contacted to schedule an appointment. He stated that he was not able to meet until Thursday at 10:30. He did not have any immediate concerns so we agreed to meet then.

## 2018-06-11 ENCOUNTER — Other Ambulatory Visit (HOSPITAL_COMMUNITY): Payer: Self-pay

## 2018-06-11 NOTE — Progress Notes (Signed)
Paramedicine Encounter    Patient ID: Sergio Hicks, male    DOB: 03-08-1981, 37 y.o.   MRN: [id number]   Patient Care Team: Hoy Register, MD as PCP - General (Family Medicine)  Patient Active Problem List   Diagnosis Date Noted  . CAD (coronary artery disease) 04/27/2018  . Obesity 01/26/2018  . ACS (acute coronary syndrome) (HCC) 01/18/2018  . Heart attack (HCC)   . Ischemic cardiomyopathy     Current Outpatient Medications:  .  albuterol (PROVENTIL HFA;VENTOLIN HFA) 108 (90 Base) MCG/ACT inhaler, Inhale 1-2 puffs into the lungs every 6 (six) hours as needed for wheezing or shortness of breath., Disp: , Rfl:  .  aspirin EC 81 MG EC tablet, Take 1 tablet (81 mg total) by mouth daily., Disp: 30 tablet, Rfl: 6 .  atorvastatin (LIPITOR) 80 MG tablet, Take 1 tablet (80 mg total) by mouth daily at 6 PM., Disp: 30 tablet, Rfl: 6 .  carvedilol (COREG) 6.25 MG tablet, Take 1 tablet (6.25 mg total) by mouth 2 (two) times daily., Disp: 60 tablet, Rfl: 2 .  clopidogrel (PLAVIX) 75 MG tablet, Take 1 tablet (75 mg total) by mouth daily with breakfast., Disp: 30 tablet, Rfl: 6 .  furosemide (LASIX) 40 MG tablet, Take 1 tablet (40 mg total) by mouth as needed., Disp: 30 tablet, Rfl:  .  potassium chloride SA (K-DUR,KLOR-CON) 20 MEQ tablet, Take 40 mEq by mouth as needed. Only on days when you need to take furosemide, Disp: , Rfl:  .  sacubitril-valsartan (ENTRESTO) 49-51 MG, Take 1 tablet by mouth 2 (two) times daily., Disp: 60 tablet, Rfl: 3 .  spironolactone (ALDACTONE) 25 MG tablet, Take 1 tablet (25 mg total) by mouth daily., Disp: 30 tablet, Rfl: 2 .  nitroGLYCERIN (NITROSTAT) 0.4 MG SL tablet, Place 1 tablet (0.4 mg total) under the tongue every 5 (five) minutes as needed for chest pain. (Patient not taking: Reported on 04/22/2018), Disp: 60 tablet, Rfl: 0 No Known Allergies    Social History   Socioeconomic History  . Marital status: Single    Spouse name: Not on file  . Number of  children: Not on file  . Years of education: Not on file  . Highest education level: Not on file  Occupational History  . Occupation: Investment banker, operational  Social Needs  . Financial resource strain: Not on file  . Food insecurity:    Worry: Not on file    Inability: Not on file  . Transportation needs:    Medical: Not on file    Non-medical: Not on file  Tobacco Use  . Smoking status: Former Smoker    Packs/day: 0.00    Types: Cigarettes  . Smokeless tobacco: Never Used  Substance and Sexual Activity  . Alcohol use: Yes    Alcohol/week: 6.0 oz    Types: 10 Standard drinks or equivalent per week  . Drug use: Not on file  . Sexual activity: Not on file  Lifestyle  . Physical activity:    Days per week: Not on file    Minutes per session: Not on file  . Stress: Not on file  Relationships  . Social connections:    Talks on phone: Not on file    Gets together: Not on file    Attends religious service: Not on file    Active member of club or organization: Not on file    Attends meetings of clubs or organizations: Not on file    Relationship status: Not on  file  . Intimate partner violence:    Fear of current or ex partner: Not on file    Emotionally abused: Not on file    Physically abused: Not on file    Forced sexual activity: Not on file  Other Topics Concern  . Not on file  Social History Narrative  . Not on file    Physical Exam  Constitutional: He is oriented to person, place, and time.  Cardiovascular: Normal rate and regular rhythm.  Pulmonary/Chest: Effort normal and breath sounds normal.  Abdominal: Soft.  Musculoskeletal: Normal range of motion. He exhibits no edema.  Neurological: He is alert and oriented to person, place, and time.  Skin: Skin is warm and dry.  Psychiatric: He has a normal mood and affect.        Future Appointments  Date Time Provider Department Center  06/18/2018  1:40 PM Bensimhon, Bevelyn Buckles, MD [facility] None  07/27/2018  2:50 PM Hoy Register, MD [facility] None    BP 100/80 (BP Location: Left Arm, Patient Position: Sitting, Cuff Size: Large)   Pulse 80   Resp 16   Wt 275 lb (124.7 kg)   SpO2 98%   BMI 34.37 kg/m   Weight yesterday- 275 lb Last visit weight-284 lb   [name] was seen at home today and reported feeling generally well. He stated he has been compliant with his medications and denied SOB, headaches, dizziness or orthopnea. He has received a package of forms from disability and I advised him to fill them out to the best of his ability and then bring them to his appointment next week for Annice Pih to look over. He was agreeable. He seems to have a good handle on his disease and understands his medication regimen. I will follow up with him next week to find out if he needs further assistance with his disability paperwork but will recommend discharge after that if he does not.   Jacqualine Code, EMT 06/11/18  ACTION: Home visit completed Next visit planned for 1 week

## 2018-06-18 ENCOUNTER — Ambulatory Visit (HOSPITAL_COMMUNITY)
Admission: RE | Admit: 2018-06-18 | Discharge: 2018-06-18 | Disposition: A | Discharge status: Home / Self Care | Payer: Self-pay | Source: Ambulatory Visit | Attending: Internal Medicine | Admitting: Internal Medicine

## 2018-06-18 VITALS — BP 124/80 | HR 69 | Wt 285.8 lb

## 2018-06-18 DIAGNOSIS — F1010 Alcohol abuse, uncomplicated: Secondary | ICD-10-CM | POA: Insufficient documentation

## 2018-06-18 DIAGNOSIS — I2582 Chronic total occlusion of coronary artery: Secondary | ICD-10-CM | POA: Insufficient documentation

## 2018-06-18 DIAGNOSIS — I252 Old myocardial infarction: Secondary | ICD-10-CM | POA: Insufficient documentation

## 2018-06-18 DIAGNOSIS — Z87891 Personal history of nicotine dependence: Secondary | ICD-10-CM | POA: Insufficient documentation

## 2018-06-18 DIAGNOSIS — I5022 Chronic systolic (congestive) heart failure: Secondary | ICD-10-CM | POA: Insufficient documentation

## 2018-06-18 DIAGNOSIS — J45909 Unspecified asthma, uncomplicated: Secondary | ICD-10-CM | POA: Insufficient documentation

## 2018-06-18 DIAGNOSIS — I2510 Atherosclerotic heart disease of native coronary artery without angina pectoris: Secondary | ICD-10-CM | POA: Insufficient documentation

## 2018-06-18 DIAGNOSIS — Z7902 Long term (current) use of antithrombotics/antiplatelets: Secondary | ICD-10-CM | POA: Insufficient documentation

## 2018-06-18 DIAGNOSIS — Z79899 Other long term (current) drug therapy: Secondary | ICD-10-CM | POA: Insufficient documentation

## 2018-06-18 DIAGNOSIS — E669 Obesity, unspecified: Secondary | ICD-10-CM | POA: Insufficient documentation

## 2018-06-18 DIAGNOSIS — Z7982 Long term (current) use of aspirin: Secondary | ICD-10-CM | POA: Insufficient documentation

## 2018-06-18 DIAGNOSIS — R42 Dizziness and giddiness: Secondary | ICD-10-CM | POA: Insufficient documentation

## 2018-06-18 DIAGNOSIS — Z720 Tobacco use: Secondary | ICD-10-CM

## 2018-06-18 LAB — BASIC METABOLIC PANEL
Anion gap: 9 (ref 5–15)
BUN: 16 mg/dL (ref 6–20)
CHLORIDE: 106 mmol/L (ref 101–111)
CO2: 23 mmol/L (ref 22–32)
Calcium: 9.4 mg/dL (ref 8.9–10.3)
Creatinine, Ser: 0.99 mg/dL (ref 0.61–1.24)
Glucose, Bld: 97 mg/dL (ref 65–99)
POTASSIUM: 4.2 mmol/L (ref 3.5–5.1)
SODIUM: 138 mmol/L (ref 135–145)

## 2018-06-18 LAB — BRAIN NATRIURETIC PEPTIDE: B NATRIURETIC PEPTIDE 5: 291.8 pg/mL — AB (ref 0.0–100.0)

## 2018-06-18 MED FILL — CARVEDILOL 6.25 MG TABLET: 6.25 | 34 days supply | Qty: 68 | Fill #0

## 2018-06-18 MED FILL — ATORVASTATIN 80 MG TABLET: 80 | 30 days supply | Qty: 30 | Fill #5

## 2018-06-18 MED FILL — CLOPIDOGREL 75 MG TABLET: 75 | 30 days supply | Qty: 30 | Fill #5

## 2018-06-18 NOTE — Addendum Note (Signed)
Encounter addended by: Noralee Space, RN on: 06/18/2018 2:12 PM  Actions taken: Order list changed, Diagnosis association updated, Sign clinical note

## 2018-06-18 NOTE — Progress Notes (Signed)
PCP: Primary HF Cardiologist: Dr Gala Romney   ADVANCED HF CLINIC NOTE   HPI: Sergio Hicks is a 37 y.o. 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   Admitted 01/18/2018 with OOH anterior MI and severe ICM. Taken urgently for cath which showed 100% LAD occlusion as below. ECHO1/20/19 LVEF 30%. Initially treated medically but developed persistent CP so taken for PCI and pt is s/p DES to proximal/mid LAD 01/20/18.  He presents today for regular follow up. Feeling well. Going to gym 5x/week. Doing 30-60/day of cardio. Can do almost anything he wants but if gets SOB if he goes up hills or goes to fast. No problem with meds, No edema, orthopnea or PND. Taking lasix only as needed - very rare. Getting dizzy spells.    - Echo 4/19 EF 30-35%.   ECHO 01/18/2018 LVEF 30-35% with large LAD territory WMA concerning for   ischemia/infarct, grade 1 DD with elevated LV filling pressure,   trivial MR, mild LAE, elevated RA pressure.  Review of systems complete and found to be negative unless listed in HPI.    SH:  Social History   Socioeconomic History  . Marital status: Single    Spouse name: Not on file  . Number of children: Not on file  . Years of education: Not on file  . Highest education level: Not on file  Occupational History  . Occupation: Investment banker, operational  Social Needs  . Financial resource strain: Not on file  . Food insecurity:    Worry: Not on file    Inability: Not on file  . Transportation needs:    Medical: Not on file    Non-medical: Not on file  Tobacco Use  . Smoking status: Former Smoker    Packs/day: 0.00    Types: Cigarettes  . Smokeless tobacco: Never Used  Substance and Sexual Activity  . Alcohol use: Yes    Alcohol/week: 6.0 oz    Types: 10 Standard drinks or equivalent per week  . Drug use: Not on file  . Sexual activity: Not on file  Lifestyle  .  Physical activity:    Days per week: Not on file    Minutes per session: Not on file  . Stress: Not on file  Relationships  . Social connections:    Talks on phone: Not on file    Gets together: Not on file    Attends religious service: Not on file    Active member of club or organization: Not on file    Attends meetings of clubs or organizations: Not on file    Relationship status: Not on file  . Intimate partner violence:    Fear of current or ex partner: Not on file    Emotionally abused: Not on file    Physically abused: Not on file    Forced sexual activity: Not on file  Other Topics Concern  . Not on file  Social History Narrative  . Not on file    FH:  Family History  Adopted: Yes    Past Medical History:  Diagnosis Date  . Asthma     Current Outpatient Medications  Medication Sig Dispense Refill  . albuterol (PROVENTIL HFA;VENTOLIN HFA) 108 (90 Base) MCG/ACT inhaler Inhale 1-2 puffs into the lungs every 6 (six) hours as needed for wheezing or shortness of breath.    Marland Kitchen aspirin EC 81 MG  EC tablet Take 1 tablet (81 mg total) by mouth daily. 30 tablet 6  . atorvastatin (LIPITOR) 80 MG tablet Take 1 tablet (80 mg total) by mouth daily at 6 PM. 30 tablet 6  . carvedilol (COREG) 6.25 MG tablet Take 1 tablet (6.25 mg total) by mouth 2 (two) times daily. 60 tablet 2  . clopidogrel (PLAVIX) 75 MG tablet Take 1 tablet (75 mg total) by mouth daily with breakfast. 30 tablet 6  . furosemide (LASIX) 40 MG tablet Take 1 tablet (40 mg total) by mouth as needed. 30 tablet   . potassium chloride SA (K-DUR,KLOR-CON) 20 MEQ tablet Take 40 mEq by mouth as needed. Only on days when you need to take furosemide    . sacubitril-valsartan (ENTRESTO) 49-51 MG Take 1 tablet by mouth 2 (two) times daily. 60 tablet 3  . spironolactone (ALDACTONE) 25 MG tablet Take 1 tablet (25 mg total) by mouth daily. 30 tablet 2  . nitroGLYCERIN (NITROSTAT) 0.4 MG SL tablet Place 1 tablet (0.4 mg total) under  the tongue every 5 (five) minutes as needed for chest pain. (Patient not taking: Reported on 04/22/2018) 60 tablet 0   No current facility-administered medications for this encounter.    Vitals:   06/18/18 1342  BP: 124/80  Pulse: 69  SpO2: 96%  Weight: 285 lb 12.8 oz (129.6 kg)   Wt Readings from Last 3 Encounters:  06/18/18 285 lb 12.8 oz (129.6 kg)  06/11/18 275 lb (124.7 kg)  05/18/18 284 lb 6.3 oz (129 kg)    PHYSICAL EXAM: General:  Well appearing. No resp difficulty HEENT: normal Neck: supple. no JVD. Carotids 2+ bilat; no bruits. No lymphadenopathy or thryomegaly appreciated. Cor: PMI nondisplaced. Regular rate & rhythm. No rubs, gallops or murmurs. Lungs: clear Abdomen: obese soft, nontender, nondistended. No hepatosplenomegaly. No bruits or masses. Good bowel sounds. Extremities: no cyanosis, clubbing, rash, edema Neuro: alert & orientedx3, cranial nerves grossly intact. moves all 4 extremities w/o difficulty. Affect pleasant   ASSESSMENT & PLAN: 1. Chronic Systolic Heart Failure -01/18/2018 ECHO EF 30-35%. Plan to repeat ECHO in 3 months after HF meds optimized. --> April 2019  - Echo EF 4/19 30-35%.  - NYHA I-II symptoms - Volume status stable on exam.  - Continue lasix prn - Was planning to increase Entresto to 97/103 bid but with dizzy spells will defer for now - Continue Carvedilol 3.125 mg BID - Continue spironolactone 25 mg daily - No longer wearing LifeVest. Pending ICD once he gets Medicaid - Reinforced fluid restriction to < 2 L daily, sodium restriction to less than 2000 mg daily, and the importance of daily weights.    2. CAD- out of hospital anterior MI with late presentation - cath 1/20 with total occlusion of LAD with R to L collaterals.  - 1/22 S/P DES to proximal/mid LAD. - No s/s of ischemia - Continue high dose statin, aspirin, and plavix. - Continue Cardiac Rehab.   3. ETOH - Very rare intake  4. Former Tobacco Abuse - Congratulated on  continued abstinence.   Arvilla Meres, MD  1:50 PM

## 2018-06-18 NOTE — Progress Notes (Signed)
Medication Samples have been provided to the patient.  Drug name: Sherryll Burger       Strength: 49/51        Qty: 1 LOT: [id number]  Exp.Date: 1/21  Dosing instructions: 1 tab Twice daily   The patient has been instructed regarding the correct time, dose, and frequency of taking this medication, including desired effects and most common side effects.   Saad Buhl 2:11 PM 06/18/2018

## 2018-06-18 NOTE — Patient Instructions (Signed)
Labs today  Your physician recommends that you schedule a follow-up appointment in: 2 months with echocardiogram  

## 2018-06-24 ENCOUNTER — Telehealth (HOSPITAL_COMMUNITY): Payer: Self-pay

## 2018-06-24 NOTE — Telephone Encounter (Signed)
I called Sergio Hicks to schedule and appointment. He reported being busy this morning but asked I call him tomorrow around 09:00 to see when he would be available.

## 2018-06-25 ENCOUNTER — Telehealth (HOSPITAL_COMMUNITY): Payer: Self-pay | Admitting: *Deleted

## 2018-06-25 ENCOUNTER — Other Ambulatory Visit (HOSPITAL_COMMUNITY): Payer: Self-pay

## 2018-06-25 NOTE — Progress Notes (Signed)
Paramedicine Encounter    Patient ID: Sergio Hicks, male    DOB: [birth year], 37 y.o.   MRN: [id number]   Patient Care Team: Hoy Register, MD as PCP - General (Family Medicine)  Patient Active Problem List   Diagnosis Date Noted  . CAD (coronary artery disease) 04/27/2018  . Obesity 01/26/2018  . ACS (acute coronary syndrome) (HCC) 01/18/2018  . Heart attack (HCC)   . Ischemic cardiomyopathy     Current Outpatient Medications:  .  albuterol (PROVENTIL HFA;VENTOLIN HFA) 108 (90 Base) MCG/ACT inhaler, Inhale 1-2 puffs into the lungs every 6 (six) hours as needed for wheezing or shortness of breath., Disp: , Rfl:  .  aspirin EC 81 MG EC tablet, Take 1 tablet (81 mg total) by mouth daily., Disp: 30 tablet, Rfl: 6 .  atorvastatin (LIPITOR) 80 MG tablet, Take 1 tablet (80 mg total) by mouth daily at 6 PM., Disp: 30 tablet, Rfl: 6 .  carvedilol (COREG) 6.25 MG tablet, Take 1 tablet (6.25 mg total) by mouth 2 (two) times daily., Disp: 60 tablet, Rfl: 2 .  clopidogrel (PLAVIX) 75 MG tablet, Take 1 tablet (75 mg total) by mouth daily with breakfast., Disp: 30 tablet, Rfl: 6 .  sacubitril-valsartan (ENTRESTO) 49-51 MG, Take 1 tablet by mouth 2 (two) times daily., Disp: 60 tablet, Rfl: 3 .  spironolactone (ALDACTONE) 25 MG tablet, Take 1 tablet (25 mg total) by mouth daily., Disp: 30 tablet, Rfl: 2 .  furosemide (LASIX) 40 MG tablet, Take 1 tablet (40 mg total) by mouth as needed. (Patient not taking: Reported on 06/25/2018), Disp: 30 tablet, Rfl:  .  nitroGLYCERIN (NITROSTAT) 0.4 MG SL tablet, Place 1 tablet (0.4 mg total) under the tongue every 5 (five) minutes as needed for chest pain. (Patient not taking: Reported on 04/22/2018), Disp: 60 tablet, Rfl: 0 .  potassium chloride SA (K-DUR,KLOR-CON) 20 MEQ tablet, Take 40 mEq by mouth as needed. Only on days when you need to take furosemide, Disp: , Rfl:  No Known Allergies    Social History   Socioeconomic History  . Marital status: Single   Spouse name: Not on file  . Number of children: Not on file  . Years of education: Not on file  . Highest education level: Not on file  Occupational History  . Occupation: Investment banker, operational  Social Needs  . Financial resource strain: Not on file  . Food insecurity:    Worry: Not on file    Inability: Not on file  . Transportation needs:    Medical: Not on file    Non-medical: Not on file  Tobacco Use  . Smoking status: Former Smoker    Packs/day: 0.00    Types: Cigarettes  . Smokeless tobacco: Never Used  Substance and Sexual Activity  . Alcohol use: Yes    Alcohol/week: 6.0 oz    Types: 10 Standard drinks or equivalent per week  . Drug use: Not on file  . Sexual activity: Not on file  Lifestyle  . Physical activity:    Days per week: Not on file    Minutes per session: Not on file  . Stress: Not on file  Relationships  . Social connections:    Talks on phone: Not on file    Gets together: Not on file    Attends religious service: Not on file    Active member of club or organization: Not on file    Attends meetings of clubs or organizations: Not on file  Relationship status: Not on file  . Intimate partner violence:    Fear of current or ex partner: Not on file    Emotionally abused: Not on file    Physically abused: Not on file    Forced sexual activity: Not on file  Other Topics Concern  . Not on file  Social History Narrative  . Not on file    Physical Exam  Constitutional: He is oriented to person, place, and time.  Cardiovascular: Normal rate and regular rhythm.  Pulmonary/Chest: Effort normal and breath sounds normal.  Abdominal: Soft.  Musculoskeletal: Normal range of motion. He exhibits no edema.  Neurological: He is alert and oriented to person, place, and time.  Skin: Skin is warm and dry.  Psychiatric: He has a normal mood and affect.        Future Appointments  Date Time Provider Department Center  07/27/2018  2:50 PM Hoy Register, MD [facility] None   08/17/2018 10:00 AM [facility] E[facility]  08/17/2018 11:20 AM Bensimhon, Bevelyn Buckles, MD [facility] None    BP (!) 84/60 (BP Location: Right Arm, Patient Position: Sitting, Cuff Size: Large)   Pulse 78   Resp 16   Wt 275 lb (124.7 kg)   SpO2 98%   BMI 34.37 kg/m   Weight yesterday-277 lb Last visit weight- 275 lb  [name] was seen at home today and reported feeling well. He denied SOB, headache, dizziness or orthopnea. He stated that he did have dizziness last week, which he discussed with the provider at his clinic appointment but it had resolved since that day. He reported being compliant with his medications however he stated he still had not received his Entresto through the mail after we ordered it several weeks ago. He called while I was with him and the person on the phone advised they showed an order was shipped on May 14th. [name] did not get this delivery so they said they would overnight the medications for delivery tomorrow. He has enough samples to get him through tomorrow morning but will be completely out after that. I requested he let me know if the medication does not arrive by tomorrow afternoon so I would have time to get him samples to make it through the weekend. He was understanding and agreeable. Upon obtaining vital signs Mr Ditomaso was found to be hypotensive. I contacted the clinic who advised that since he was asymptomatic then there was not reason to withhold medications but if he becomes dizzy then he should not take his evening medication. I relayed this to Mr Ard and he was agreeable.   Jacqualine Code, EMT 06/25/18  ACTION: Home visit completed

## 2018-06-25 NOTE — Telephone Encounter (Signed)
[name] with paramediicne went to see patient today.  BP systolic was 86, patient asymptomatic. Patient's BP normally runs in the 90-100's.  I advised Ian Malkin that since he's asymptomatic no changes for now.  If he feels dizzy or lightheaded he may hold his evening dose of entresto.  Patient will call Ian Malkin if he does become symptomatic, otherwise Ian Malkin will plan to follow up with patient next week.

## 2018-06-26 ENCOUNTER — Telehealth (HOSPITAL_COMMUNITY): Payer: Self-pay | Admitting: *Deleted

## 2018-06-29 ENCOUNTER — Telehealth (HOSPITAL_COMMUNITY): Payer: Self-pay

## 2018-06-29 NOTE — Telephone Encounter (Signed)
Sergio Hicks contacted me via text message. He stated that his Sergio Hicks had not come overnight as the pharmacy stated they would do but he was able to get samples from the clinic. He stated the Entresto did come over the weekend. We agreed to meet on Wednesday at 13:00 as well.

## 2018-07-01 ENCOUNTER — Telehealth (HOSPITAL_COMMUNITY): Payer: Self-pay

## 2018-07-01 NOTE — Telephone Encounter (Signed)
Sergio Hicks sent me a text today and stated he was not going to be able to make our 13:00 appointment time, because he had to work. He stated he would likely be able to meet on Friday at 10:00.

## 2018-07-03 ENCOUNTER — Telehealth (HOSPITAL_COMMUNITY): Payer: Self-pay

## 2018-07-03 NOTE — Telephone Encounter (Signed)
I arrived at Sergio Hicks's house for our scheduled appointment. He was not home so I called him to see if her was on his way. He advised that he had been called into work and was about to call and let me know he would need to reschedule. We agreed to meet next week at a time to be set later.

## 2018-07-08 MED FILL — SPIRONOLACTONE 25 MG TABLET: 25 | 30 days supply | Qty: 30 | Fill #2

## 2018-07-09 ENCOUNTER — Telehealth (HOSPITAL_COMMUNITY): Payer: Self-pay

## 2018-07-09 NOTE — Telephone Encounter (Signed)
I contacted Mr Genovese this morning to schedule an appointment. He stated he was going to be working the next two mornings and said he would contact me after his shift to schedule a time that would be convenient for both of [place].

## 2018-07-10 ENCOUNTER — Other Ambulatory Visit (HOSPITAL_COMMUNITY): Payer: Self-pay

## 2018-07-10 ENCOUNTER — Telehealth (HOSPITAL_COMMUNITY): Payer: Self-pay

## 2018-07-10 NOTE — Telephone Encounter (Signed)
[name] sent me a text this morning and stated that he was going to be working until around 11:00 and would contact me then to set up an appointment.

## 2018-07-10 NOTE — Progress Notes (Addendum)
Paramedicine Encounter    Patient ID: Sergio Hicks, male    DOB: 1981-01-09, 37 y.o.   MRN: [id number]   Patient Care Team: Hoy Register, MD as PCP - General (Family Medicine)  Patient Active Problem List   Diagnosis Date Noted  . CAD (coronary artery disease) 04/27/2018  . Obesity 01/26/2018  . ACS (acute coronary syndrome) (HCC) 01/18/2018  . Heart attack (HCC)   . Ischemic cardiomyopathy     Current Outpatient Medications:  .  aspirin EC 81 MG EC tablet, Take 1 tablet (81 mg total) by mouth daily., Disp: 30 tablet, Rfl: 6 .  atorvastatin (LIPITOR) 80 MG tablet, Take 1 tablet (80 mg total) by mouth daily at 6 PM., Disp: 30 tablet, Rfl: 6 .  carvedilol (COREG) 6.25 MG tablet, Take 1 tablet (6.25 mg total) by mouth 2 (two) times daily., Disp: 60 tablet, Rfl: 2 .  clopidogrel (PLAVIX) 75 MG tablet, Take 1 tablet (75 mg total) by mouth daily with breakfast., Disp: 30 tablet, Rfl: 6 .  sacubitril-valsartan (ENTRESTO) 49-51 MG, Take 1 tablet by mouth 2 (two) times daily., Disp: 60 tablet, Rfl: 3 .  spironolactone (ALDACTONE) 25 MG tablet, Take 1 tablet (25 mg total) by mouth daily., Disp: 30 tablet, Rfl: 2 .  albuterol (PROVENTIL HFA;VENTOLIN HFA) 108 (90 Base) MCG/ACT inhaler, Inhale 1-2 puffs into the lungs every 6 (six) hours as needed for wheezing or shortness of breath., Disp: , Rfl:  .  furosemide (LASIX) 40 MG tablet, Take 1 tablet (40 mg total) by mouth as needed. (Patient not taking: Reported on 06/25/2018), Disp: 30 tablet, Rfl:  .  nitroGLYCERIN (NITROSTAT) 0.4 MG SL tablet, Place 1 tablet (0.4 mg total) under the tongue every 5 (five) minutes as needed for chest pain. (Patient not taking: Reported on 04/22/2018), Disp: 60 tablet, Rfl: 0 .  potassium chloride SA (K-DUR,KLOR-CON) 20 MEQ tablet, Take 40 mEq by mouth as needed. Only on days when you need to take furosemide, Disp: , Rfl:  No Known Allergies    Social History   Socioeconomic History  . Marital status: Single   Spouse name: Not on file  . Number of children: Not on file  . Years of education: Not on file  . Highest education level: Not on file  Occupational History  . Occupation: Investment banker, operational  Social Needs  . Financial resource strain: Not on file  . Food insecurity:    Worry: Not on file    Inability: Not on file  . Transportation needs:    Medical: Not on file    Non-medical: Not on file  Tobacco Use  . Smoking status: Former Smoker    Packs/day: 0.00    Types: Cigarettes  . Smokeless tobacco: Never Used  Substance and Sexual Activity  . Alcohol use: Yes    Alcohol/week: 6.0 oz    Types: 10 Standard drinks or equivalent per week  . Drug use: Not on file  . Sexual activity: Not on file  Lifestyle  . Physical activity:    Days per week: Not on file    Minutes per session: Not on file  . Stress: Not on file  Relationships  . Social connections:    Talks on phone: Not on file    Gets together: Not on file    Attends religious service: Not on file    Active member of club or organization: Not on file    Attends meetings of clubs or organizations: Not on file  Relationship status: Not on file  . Intimate partner violence:    Fear of current or ex partner: Not on file    Emotionally abused: Not on file    Physically abused: Not on file    Forced sexual activity: Not on file  Other Topics Concern  . Not on file  Social History Narrative  . Not on file    Physical Exam  Constitutional: He is oriented to person, place, and time.  Cardiovascular: Normal rate and regular rhythm.  Pulmonary/Chest: Effort normal and breath sounds normal.  Musculoskeletal: Normal range of motion. He exhibits no edema.  Neurological: He is alert and oriented to person, place, and time.  Skin: Skin is warm and dry.  Psychiatric: He has a normal mood and affect.        Future Appointments  Date Time Provider Department Center  07/27/2018  2:50 PM Hoy Register, MD [facility] None  08/17/2018 10:00  AM [facility] ECHO 1-[facility]  08/17/2018 11:20 AM Bensimhon, Bevelyn Buckles, MD [facility] None    BP (!) 90/58 (BP Location: Right Arm, Patient Position: Sitting, Cuff Size: Large)   Pulse 84   Resp 18   Wt 275 lb 9.2 oz (125 kg)   SpO2 97%   BMI 34.44 kg/m   Weight yesterday- 275 lb Last visit weight- 275 lb  Sergio Hicks was seen at home today and rep[orted feeling generally well. He had just gotten home from the gym where he has been working out approximately 5 days per week. He has been compliant with his medications which were verified while I was there. Entresto needs new referral to [employer], since he only has one refill left on his current patient assistance referral. I will relay this information to the clinic. Today we spoke about discharge from the paramedicine program. I believe he has a good understanding of his disease process and has shown to be compliant with medications over the past several months. He said he feels comfortable going forward without the assistance of paramedicine so I will contact the clinic and left them know he is to be discharged.   Jacqualine Code, EMT 07/10/18  ACTION: Home visit completed

## 2018-07-15 MED FILL — CLOPIDOGREL 75 MG TABLET: 75 | 30 days supply | Qty: 30 | Fill #6

## 2018-07-15 MED FILL — ATORVASTATIN 80 MG TABLET: 80 | 30 days supply | Qty: 30 | Fill #6

## 2018-07-22 ENCOUNTER — Other Ambulatory Visit (HOSPITAL_COMMUNITY): Payer: Self-pay | Admitting: Student

## 2018-07-22 ENCOUNTER — Other Ambulatory Visit (HOSPITAL_COMMUNITY): Payer: Self-pay | Admitting: Internal Medicine

## 2018-07-27 ENCOUNTER — Ambulatory Visit: Payer: Self-pay | Admitting: Family Medicine

## 2018-07-31 ENCOUNTER — Encounter: Payer: Self-pay | Admitting: Family Medicine

## 2018-08-03 ENCOUNTER — Other Ambulatory Visit (HOSPITAL_COMMUNITY): Payer: Self-pay | Admitting: *Deleted

## 2018-08-03 MED ORDER — SACUBITRIL-VALSARTAN 49-51 MG PO TABS: 1.0000 | ORAL_TABLET | Freq: Two times a day (BID) | ORAL | 11 refills | Status: DC

## 2018-08-03 MED ORDER — SPIRONOLACTONE 25 MG PO TABS: 25.0000 mg | ORAL_TABLET | Freq: Every day | ORAL | 2 refills | Status: DC

## 2018-08-03 MED ORDER — CLOPIDOGREL BISULFATE 75 MG PO TABS: 75.0000 mg | ORAL_TABLET | Freq: Every day | ORAL | 2 refills | Status: DC

## 2018-08-03 MED ORDER — ATORVASTATIN CALCIUM 80 MG PO TABS: ORAL_TABLET | ORAL | 2 refills | Status: DC

## 2018-08-03 MED FILL — SPIRONOLACTONE 25 MG TABLET: 25 | 30 days supply | Qty: 30 | Fill #0

## 2018-08-03 MED FILL — CARVEDILOL 6.25 MG TABLET: 6.25 | 34 days supply | Qty: 68 | Fill #1

## 2018-08-05 ENCOUNTER — Telehealth (HOSPITAL_COMMUNITY): Payer: Self-pay | Admitting: Surgery

## 2018-08-05 NOTE — Telephone Encounter (Signed)
Sergio Hicks will be discharged from [facility] program secondary to successful completion.  He is aware that he can call the [facility] with any worsening symptoms and will continue to follow with the the clinic.

## 2018-08-14 MED FILL — CLOPIDOGREL 75 MG TABLET: 75 | 30 days supply | Qty: 30 | Fill #0

## 2018-08-14 MED FILL — ATORVASTATIN 80 MG TABLET: 80 | 30 days supply | Qty: 30 | Fill #0

## 2018-08-17 ENCOUNTER — Ambulatory Visit (HOSPITAL_BASED_OUTPATIENT_CLINIC_OR_DEPARTMENT_OTHER)
Admission: RE | Admit: 2018-08-17 | Discharge: 2018-08-17 | Disposition: A | Discharge status: Home / Self Care | Payer: Medicaid Other | Source: Ambulatory Visit | Attending: Internal Medicine | Admitting: Internal Medicine

## 2018-08-17 ENCOUNTER — Ambulatory Visit (HOSPITAL_COMMUNITY)
Admission: RE | Admit: 2018-08-17 | Discharge: 2018-08-17 | Disposition: A | Discharge status: Home / Self Care | Payer: Medicaid Other | Source: Ambulatory Visit | Attending: Family Medicine | Admitting: Family Medicine

## 2018-08-17 VITALS — BP 98/66 | HR 58 | Wt 293.0 lb

## 2018-08-17 DIAGNOSIS — I5022 Chronic systolic (congestive) heart failure: Secondary | ICD-10-CM

## 2018-08-17 DIAGNOSIS — I371 Nonrheumatic pulmonary valve insufficiency: Secondary | ICD-10-CM | POA: Insufficient documentation

## 2018-08-17 DIAGNOSIS — I340 Nonrheumatic mitral (valve) insufficiency: Secondary | ICD-10-CM | POA: Diagnosis not present

## 2018-08-17 DIAGNOSIS — I2510 Atherosclerotic heart disease of native coronary artery without angina pectoris: Secondary | ICD-10-CM

## 2018-08-17 DIAGNOSIS — I255 Ischemic cardiomyopathy: Secondary | ICD-10-CM

## 2018-08-17 LAB — BASIC METABOLIC PANEL
Anion gap: 9 (ref 5–15)
BUN: 10 mg/dL (ref 6–20)
CALCIUM: 9.1 mg/dL (ref 8.9–10.3)
CHLORIDE: 104 mmol/L (ref 98–111)
CO2: 23 mmol/L (ref 22–32)
CREATININE: 0.91 mg/dL (ref 0.61–1.24)
Glucose, Bld: 100 mg/dL — ABNORMAL HIGH (ref 70–99)
Potassium: 4.3 mmol/L (ref 3.5–5.1)
SODIUM: 136 mmol/L (ref 135–145)

## 2018-08-17 LAB — BRAIN NATRIURETIC PEPTIDE: B NATRIURETIC PEPTIDE 5: 326.4 pg/mL — AB (ref 0.0–100.0)

## 2018-08-17 MED ORDER — PERFLUTREN LIPID MICROSPHERE
1.0000 mL | INTRAVENOUS | Status: DC | PRN
  Administered 2018-08-17: 2 mL via INTRAVENOUS
  Filled 2018-08-17: qty 10

## 2018-08-17 NOTE — Progress Notes (Signed)
CSW met with patient who is currently uninsured. Patient reports that he received a denial from both medicaid and disability applications. Patient reports multiple unpaid medical bills and unsure if he applied for deductible medicaid. Patient states his sister has been instrumental in assisting him with these applications and appeals. Patient had his sister call CSW and discussed further. Morey Hummingbird sister states she plans to take patient back to [place] to reapply for the deductible medicaid program to see if eligible. Morey Hummingbird will call CSW on Monday to rely the outcome of DSS visit and next steps. CSW continues to be available. Raquel Sarna, [place], [place]

## 2018-08-17 NOTE — Progress Notes (Signed)
  Echocardiogram 2D Echocardiogram has been performed.  Sergio Hicks F 08/17/2018, 11:23 AM

## 2018-08-17 NOTE — Progress Notes (Signed)
PCP: Primary HF Cardiologist: Dr Gala Romney   ADVANCED HF CLINIC NOTE   HPI: Sergio Hicks is a 37 y.o. 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   Admitted 01/18/2018 with OOH anterior MI and severe ICM. Taken urgently for cath which showed 100% LAD occlusion as below. ECHO1/20/19 LVEF 30%. Initially treated medically but developed persistent CP so taken for PCI and pt is s/p DES to proximal/mid LAD 01/20/18.  He presents today for regular follow up. Overall doing okay. He lost his dog recently and has been having a hard time with it. He has also been stress eating. Feels sad. Having some RUQ abdominal pain today. Started at rest, does not correlate with any meals. Going to the gym most days. Doing at least 30 minutes of cardio daily. No SOB with hills or stairs. Has dizziness when he first stands up. Happens rarely. No edema, orthopnea, or PND. Poor appetite and energy level. Last PRN lasix was 2 days ago with weight gain. Had midsternal CP that is more frequent, occurring 1-2x/day. Lasts less than 1 minute. Does not occur with activity. No associated symptoms. Weights 280-283 lbs at home, trended up a little lately due to increase PO intake. He missed PM doses of medication 2 weeks ago, but otherwise none. Limits fluid and salt. Says he needs to go back to work as he cannot afford not to work. Trying to pay off his medical bills.    Echo 07/2018: EF 25-30% Personally reviewed   Echo 4/19 EF 30-35%.   ECHO 01/18/2018 LVEF 30-35% with large LAD territory WMA concerning for   ischemia/infarct, grade 1 DD with elevated LV filling pressure,   trivial MR, mild LAE, elevated RA pressure.  Review of systems complete and found to be negative unless listed in HPI.   SH:  Social History   Socioeconomic History  . Marital status: Single    Spouse name: Not on file  . Number of  children: Not on file  . Years of education: Not on file  . Highest education level: Not on file  Occupational History  . Occupation: Investment banker, operational  Social Needs  . Financial resource strain: Not on file  . Food insecurity:    Worry: Not on file    Inability: Not on file  . Transportation needs:    Medical: Not on file    Non-medical: Not on file  Tobacco Use  . Smoking status: Former Smoker    Packs/day: 0.00    Types: Cigarettes  . Smokeless tobacco: Never Used  Substance and Sexual Activity  . Alcohol use: Yes    Alcohol/week: 10.0 standard drinks    Types: 10 Standard drinks or equivalent per week  . Drug use: Not on file  . Sexual activity: Not on file  Lifestyle  . Physical activity:    Days per week: Not on file    Minutes per session: Not on file  . Stress: Not on file  Relationships  . Social connections:    Talks on phone: Not on file    Gets together: Not on file    Attends religious service: Not on file    Active member of club or organization: Not on file    Attends meetings of clubs or organizations: Not on file    Relationship status: Not on file  . Intimate partner violence:    Fear  of current or ex partner: Not on file    Emotionally abused: Not on file    Physically abused: Not on file    Forced sexual activity: Not on file  Other Topics Concern  . Not on file  Social History Narrative  . Not on file    FH:  Family History  Adopted: Yes    Past Medical History:  Diagnosis Date  . Asthma     Current Outpatient Medications  Medication Sig Dispense Refill  . albuterol (PROVENTIL HFA;VENTOLIN HFA) 108 (90 Base) MCG/ACT inhaler Inhale 1-2 puffs into the lungs every 6 (six) hours as needed for wheezing or shortness of breath.    Marland Kitchen aspirin EC 81 MG EC tablet Take 1 tablet (81 mg total) by mouth daily. 30 tablet 6  . atorvastatin (LIPITOR) 80 MG tablet TAKE 1 TABLET BY MOUTH DAILY AT 6 PM. 30 tablet 2  . carvedilol (COREG) 6.25 MG tablet Take 1 tablet  (6.25 mg total) by mouth 2 (two) times daily. 60 tablet 2  . clopidogrel (PLAVIX) 75 MG tablet Take 1 tablet (75 mg total) by mouth daily with breakfast. 30 tablet 2  . furosemide (LASIX) 40 MG tablet Take 1 tablet (40 mg total) by mouth as needed. 30 tablet   . nitroGLYCERIN (NITROSTAT) 0.4 MG SL tablet Place 1 tablet (0.4 mg total) under the tongue every 5 (five) minutes as needed for chest pain. 60 tablet 0  . potassium chloride SA (K-DUR,KLOR-CON) 20 MEQ tablet Take 40 mEq by mouth as needed. Only on days when you need to take furosemide    . sacubitril-valsartan (ENTRESTO) 49-51 MG Take 1 tablet by mouth 2 (two) times daily. 60 tablet 11  . spironolactone (ALDACTONE) 25 MG tablet Take 1 tablet (25 mg total) by mouth daily. 30 tablet 2   No current facility-administered medications for this encounter.    Facility-Administered Medications Ordered in Other Encounters  Medication Dose Route Frequency Provider Last Rate Last Dose  . perflutren lipid microspheres (DEFINITY) IV suspension  1-10 mL Intravenous PRN Dwyane Dee, MD   2 mL at 08/17/18 1123   Vitals:   08/17/18 1125  BP: 98/66  Pulse: (!) 58  SpO2: 98%  Weight: 132.9 kg (293 lb)   Wt Readings from Last 3 Encounters:  08/17/18 132.9 kg (293 lb)  07/10/18 125 kg (275 lb 9.2 oz)  06/25/18 124.7 kg (275 lb)    PHYSICAL EXAM: General: Well appearing. No resp difficulty. HEENT: Normal anicteric Neck: Supple. No JVD. Carotids 2+ bilat; no bruits. No thyromegaly or nodule noted. Cor: PMI laterally displaced. RRR, No M/G/R noted Lungs: CTAB, normal effort. Abdomen: Obese Soft, non-tender, non-distended, no HSM. No bruits or masses. +BS  Extremities: no cyanosis, clubbing, rash, edema Neuro: alert & oriented x 3, cranial nerves grossly intact. moves all 4 extremities w/o difficulty. Affect pleasant   ASSESSMENT & PLAN: 1. 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  - Echo EF 4/19 30-35%.  - Echo  today EF 25-30% Personally reviewed - NYHA II symptoms - Volume status stable on exam despite weight gain.  - Continue lasix prn - Continue Entresto 49/51 mg BID. - Continue Carvedilol 3.125 mg BID - Continue spironolactone 25 mg daily - No longer wearing LifeVest. Pending ICD once he gets Medicaid - Reinforced fluid restriction to < 2 L daily, sodium restriction to less than 2000 mg daily, and the importance of daily weights.   - Discharged from HF paramedicine -  Dr Gala Romney will speak with EP doctors about ICD. He has no insurance and has been turned down for medicaid. Will also have Annice Pih, CSW come speak with him today.  - Spoke with Gypsy Balsam, EP NP. She could get him an ICD for free. Recommended referring to Dr Ladona Ridgel or Graciela Husbands. Would still have to pay about $130 every 3 months for remote monitoring.    2. CAD- out of hospital anterior MI with late presentation - cath 1/20 with total occlusion of LAD with R to L collaterals.  - 1/22 S/P DES to proximal/mid LAD. - No s/s of ischemia - Continue high dose statin, aspirin, and plavix. - Cannot afford cardiac rehab. Exercising 30-60 minutes daily.  3. ETOH - Has rare glass of wine.   4. Former Tobacco Abuse - Congratulated on continued abstinence. No change.   Alford Highland, NP  11:28 AM  Spoke with Gypsy Balsam, EP NP. She says that they could get him an ICD for free. Recommended referring to Dr Ladona Ridgel or Dr Graciela Husbands (both are familiar on how to get free ICD). He would still have to pay about $130 every 3 months for remote monitoring.    Patient seen and examined with the above-signed Advanced Practice Provider and/or Housestaff. I personally reviewed laboratory data, imaging studies and relevant notes. I independently examined the patient and formulated the important aspects of the plan. I have edited the note to reflect any of my changes or salient points. I have personally discussed the plan with the patient and/or  family.  Overall doing well. NYHA II. Volume status looks good. Echo reviewed today in clinic EF 25-30% with extensive anterior infarct. BP too low to titrate meds. Long discussion about need for ICD and he is interested but unsure if he can afford. We spoke with Gypsy Balsam, NP in EP clinic and said he could get an ICD for free but would have to pay for monitoring program. He will f/u with EP.  Arvilla Meres, MD  8:07 PM

## 2018-08-17 NOTE — Patient Instructions (Signed)
Labs today  Your physician recommends that you schedule a follow-up appointment in: 3 months  

## 2018-08-21 ENCOUNTER — Encounter: Payer: Self-pay | Admitting: Family Medicine

## 2018-09-04 MED FILL — CARVEDILOL 6.25 MG TABLET: 6.25 | 34 days supply | Qty: 68 | Fill #2

## 2018-09-04 MED FILL — SPIRONOLACTONE 25 MG TABLET: 25 | 30 days supply | Qty: 30 | Fill #1

## 2018-09-16 MED FILL — ATORVASTATIN 80 MG TABLET: 80 | 30 days supply | Qty: 30 | Fill #1

## 2018-09-23 MED FILL — CLOPIDOGREL 75 MG TABLET: 75 | 30 days supply | Qty: 30 | Fill #1

## 2018-09-30 ENCOUNTER — Other Ambulatory Visit (HOSPITAL_COMMUNITY): Payer: Self-pay | Admitting: *Deleted

## 2018-09-30 ENCOUNTER — Encounter (HOSPITAL_COMMUNITY): Payer: Self-pay | Admitting: *Deleted

## 2018-09-30 MED ORDER — CARVEDILOL 6.25 MG PO TABS: 6.2500 mg | ORAL_TABLET | Freq: Two times a day (BID) | ORAL | 2 refills | Status: DC

## 2018-09-30 MED ORDER — SPIRONOLACTONE 25 MG PO TABS: 25.0000 mg | ORAL_TABLET | Freq: Every day | ORAL | 2 refills | Status: DC

## 2018-09-30 MED FILL — SPIRONOLACTONE 25 MG TABLET: 25 | 30 days supply | Qty: 30 | Fill #0

## 2018-09-30 MED FILL — CARVEDILOL 6.25 MG TABLET: 6.25 | 30 days supply | Qty: 60 | Fill #0

## 2018-10-01 ENCOUNTER — Telehealth (HOSPITAL_COMMUNITY): Payer: Self-pay | Admitting: *Deleted

## 2018-10-01 NOTE — Telephone Encounter (Signed)
Pt called and stated his SS disability request was denied and he is doing an appeal and needs a letter of support and last ov note and echo.  Letter completed by Dr Gala Romney.  Left pt message that letter and records are ready at front desk for him to p/u

## 2018-10-23 MED FILL — ATORVASTATIN 80 MG TABLET: 80 | 30 days supply | Qty: 30 | Fill #2

## 2018-10-23 MED FILL — CLOPIDOGREL 75 MG TABLET: 75 | 30 days supply | Qty: 30 | Fill #2

## 2018-10-26 ENCOUNTER — Telehealth (HOSPITAL_COMMUNITY): Payer: Self-pay

## 2018-10-26 MED FILL — SPIRONOLACTONE 25 MG TABLET: 25 | 30 days supply | Qty: 30 | Fill #1

## 2018-10-26 MED FILL — CARVEDILOL 6.25 MG TABLET: 6.25 | 30 days supply | Qty: 60 | Fill #1

## 2018-10-26 MED FILL — POTASSIUM CL ER 20 MEQ TAB: 20 | 30 days supply | Qty: 60 | Fill #3

## 2018-10-26 NOTE — Telephone Encounter (Signed)
Mr. Seidman stated recently hearing about the muscle pains/aches that can be seen with atorvastatin. He reported experiencing them also in his legs and neck sometimes. He also reports working out and it could "just be in my head now" ever since hearing about it from a friend. I recommended discussing with his physician at his next upcoming visit.  He also has been limiting his fluids at certain times of the day and drinks a little extra when he works out. Patient weighs himself every day. He denies any sudden large change in weight/swelling/SOB. He also reports limiting his salt intake.  I refilled his spironolactone, carvedilol, and potassium per pt request.   Mirna Mires PharmD Candidate  [employer] Class of [phone]

## 2018-11-18 ENCOUNTER — Ambulatory Visit (HOSPITAL_COMMUNITY)
Admission: RE | Admit: 2018-11-18 | Discharge: 2018-11-18 | Disposition: A | Discharge status: Home / Self Care | Payer: Self-pay | Source: Ambulatory Visit | Attending: Internal Medicine | Admitting: Internal Medicine

## 2018-11-18 ENCOUNTER — Encounter (HOSPITAL_COMMUNITY): Payer: Self-pay | Admitting: Internal Medicine

## 2018-11-18 VITALS — BP 90/68 | HR 69 | Wt 293.4 lb

## 2018-11-18 DIAGNOSIS — Z955 Presence of coronary angioplasty implant and graft: Secondary | ICD-10-CM | POA: Insufficient documentation

## 2018-11-18 DIAGNOSIS — Z7902 Long term (current) use of antithrombotics/antiplatelets: Secondary | ICD-10-CM | POA: Insufficient documentation

## 2018-11-18 DIAGNOSIS — I5022 Chronic systolic (congestive) heart failure: Secondary | ICD-10-CM

## 2018-11-18 DIAGNOSIS — J45909 Unspecified asthma, uncomplicated: Secondary | ICD-10-CM | POA: Insufficient documentation

## 2018-11-18 DIAGNOSIS — E669 Obesity, unspecified: Secondary | ICD-10-CM | POA: Insufficient documentation

## 2018-11-18 DIAGNOSIS — I255 Ischemic cardiomyopathy: Secondary | ICD-10-CM

## 2018-11-18 DIAGNOSIS — F1010 Alcohol abuse, uncomplicated: Secondary | ICD-10-CM | POA: Insufficient documentation

## 2018-11-18 DIAGNOSIS — I2510 Atherosclerotic heart disease of native coronary artery without angina pectoris: Secondary | ICD-10-CM

## 2018-11-18 DIAGNOSIS — I252 Old myocardial infarction: Secondary | ICD-10-CM | POA: Insufficient documentation

## 2018-11-18 DIAGNOSIS — Z87891 Personal history of nicotine dependence: Secondary | ICD-10-CM | POA: Insufficient documentation

## 2018-11-18 DIAGNOSIS — Z79899 Other long term (current) drug therapy: Secondary | ICD-10-CM | POA: Insufficient documentation

## 2018-11-18 DIAGNOSIS — Z7982 Long term (current) use of aspirin: Secondary | ICD-10-CM | POA: Insufficient documentation

## 2018-11-18 LAB — COMPREHENSIVE METABOLIC PANEL
ALT: 24 U/L (ref 0–44)
AST: 18 U/L (ref 15–41)
Albumin: 3.9 g/dL (ref 3.5–5.0)
Alkaline Phosphatase: 40 U/L (ref 38–126)
Anion gap: 7 (ref 5–15)
BILIRUBIN TOTAL: 0.9 mg/dL (ref 0.3–1.2)
BUN: 14 mg/dL (ref 6–20)
CHLORIDE: 106 mmol/L (ref 98–111)
CO2: 22 mmol/L (ref 22–32)
CREATININE: 0.91 mg/dL (ref 0.61–1.24)
Calcium: 9.2 mg/dL (ref 8.9–10.3)
Glucose, Bld: 99 mg/dL (ref 70–99)
Potassium: 4 mmol/L (ref 3.5–5.1)
Sodium: 135 mmol/L (ref 135–145)
TOTAL PROTEIN: 7 g/dL (ref 6.5–8.1)

## 2018-11-18 LAB — CK: CK TOTAL: 100 U/L (ref 49–397)

## 2018-11-18 NOTE — Progress Notes (Signed)
PCP: Primary HF Cardiologist: Dr Gala Romney   ADVANCED HF CLINIC NOTE   HPI: Sergio Hicks is a 37 y.o. 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   Admitted 01/18/2018 with OOH anterior MI and severe ICM. Taken urgently for cath which showed 100% LAD occlusion as below. ECHO1/20/19 LVEF 30%. Initially treated medically but developed persistent CP so taken for PCI and pt is s/p DES to proximal/mid LAD 01/20/18.  He presents today for regular follow up. Last visit referred to EP for ?free ICD. It doesn't look like an appointment was made. He was denied for disability and given a letter of support to help with appeal. Overall doing okay. Never heard from EP about ICD. Working 15 hours or less at [employer]. He is in a waiting period with disability. Not interested in talking to CSW today. SOB comes and goes. Denies SOB with hills or stairs typically. Goes to the gym 5-6 days/week. He is swimming, doing 40 min of cardio, and then light weights (2 hours total exercise). No edema, orthopnea, or PND. Hurt his left ankle after jumping off a curb and has some numbness. No CP. Occasional dizziness with standing quickly. Having muscle aches on atorvastatin, but still taking. Taking lasix PRN. Last time 3-4 weeks ago. Taking all medications. Missed a few pm doses, but rarely misses. Weights: 280 lbs at home. Drinks a glass of wine or beer occasionally.   Echo 07/2018: EF 25-30%  Echo 4/19 EF 30-35%.   ECHO 01/18/2018 LVEF 30-35% with large LAD territory WMA concerning for   ischemia/infarct, grade 1 DD with elevated LV filling pressure,   trivial MR, mild LAE, elevated RA pressure.  Review of systems complete and found to be negative unless listed in HPI.   SH:  Social History   Socioeconomic History  . Marital status: Single    Spouse name: Not on file  . Number of children: Not on  file  . Years of education: Not on file  . Highest education level: Not on file  Occupational History  . Occupation: Investment banker, operational  Social Needs  . Financial resource strain: Not on file  . Food insecurity:    Worry: Not on file    Inability: Not on file  . Transportation needs:    Medical: Not on file    Non-medical: Not on file  Tobacco Use  . Smoking status: Former Smoker    Packs/day: 0.00    Types: Cigarettes  . Smokeless tobacco: Never Used  Substance and Sexual Activity  . Alcohol use: Yes    Alcohol/week: 10.0 standard drinks    Types: 10 Standard drinks or equivalent per week  . Drug use: Not on file  . Sexual activity: Not on file  Lifestyle  . Physical activity:    Days per week: Not on file    Minutes per session: Not on file  . Stress: Not on file  Relationships  . Social connections:    Talks on phone: Not on file    Gets together: Not on file    Attends religious service: Not on file    Active member of club or organization: Not on file    Attends meetings of clubs or organizations: Not on file    Relationship status: Not on file  . Intimate partner violence:    Fear of current or ex partner: Not on  file    Emotionally abused: Not on file    Physically abused: Not on file    Forced sexual activity: Not on file  Other Topics Concern  . Not on file  Social History Narrative  . Not on file    FH:  Family History  Adopted: Yes    Past Medical History:  Diagnosis Date  . Asthma     Current Outpatient Medications  Medication Sig Dispense Refill  . albuterol (PROVENTIL HFA;VENTOLIN HFA) 108 (90 Base) MCG/ACT inhaler Inhale 1-2 puffs into the lungs every 6 (six) hours as needed for wheezing or shortness of breath.    Marland Kitchen aspirin EC 81 MG EC tablet Take 1 tablet (81 mg total) by mouth daily. 30 tablet 6  . atorvastatin (LIPITOR) 80 MG tablet TAKE 1 TABLET BY MOUTH DAILY AT 6 PM. 30 tablet 2  . carvedilol (COREG) 6.25 MG tablet Take 1 tablet (6.25 mg total) by  mouth 2 (two) times daily. 60 tablet 2  . clopidogrel (PLAVIX) 75 MG tablet Take 1 tablet (75 mg total) by mouth daily with breakfast. 30 tablet 2  . furosemide (LASIX) 40 MG tablet Take 1 tablet (40 mg total) by mouth as needed. 30 tablet   . nitroGLYCERIN (NITROSTAT) 0.4 MG SL tablet Place 1 tablet (0.4 mg total) under the tongue every 5 (five) minutes as needed for chest pain. 60 tablet 0  . potassium chloride SA (K-DUR,KLOR-CON) 20 MEQ tablet Take 40 mEq by mouth as needed. Only on days when you need to take furosemide    . sacubitril-valsartan (ENTRESTO) 49-51 MG Take 1 tablet by mouth 2 (two) times daily. 60 tablet 11  . spironolactone (ALDACTONE) 25 MG tablet Take 1 tablet (25 mg total) by mouth daily. 30 tablet 2   No current facility-administered medications for this encounter.    Vitals:   11/18/18 1153  BP: 90/68  Pulse: 69  SpO2: 99%  Weight: 133.1 kg (293 lb 6.4 oz)   Wt Readings from Last 3 Encounters:  11/18/18 133.1 kg (293 lb 6.4 oz)  08/17/18 132.9 kg (293 lb)  07/10/18 125 kg (275 lb 9.2 oz)    PHYSICAL EXAM: General: Well appearing. No resp difficulty. HEENT: Normal Neck: Supple. JVP flat. Carotids 2+ bilat; no bruits. No thyromegaly or nodule noted. Cor: PMI laterally displaced. RRR, No M/G/R noted Lungs: CTAB, normal effort. Abdomen: Obese, soft, non-tender, non-distended, no HSM. No bruits or masses. +BS  Extremities: No cyanosis, clubbing, or rash. R and LLE no edema.  Neuro: Alert & orientedx3, cranial nerves grossly intact. moves all 4 extremities w/o difficulty. Affect pleasant   ASSESSMENT & PLAN: 1. 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  - Echo EF 4/19 30-35%.  - Echo 8/19 EF 25-30% Personally reviewed by Dr Gala Romney - NYHA II symptoms - Volume status stable on exam - Continue lasix prn. Takes rarely. - Continue Entresto 49/51 mg BID. No BP room to increase - Continue Carvedilol 6.25 mg BID - Continue  spironolactone 25 mg daily - No longer wearing LifeVest. Pending ICD once he gets Medicaid (see below) - Reinforced fluid restriction to < 2 L daily, sodium restriction to less than 2000 mg daily, and the importance of daily weights.   - Discharged from HF paramedicine - Going through disability appeal. Sister is a CSW and has been helping him. He declines to see our CSW today. - Spoke with Gypsy Balsam, EP NP again today. Will refer to Dr  Ladona Ridgel for free ICD. He would still have to pay about $130 every 3 months for remote monitoring.    2. CAD- out of hospital anterior MI with late presentation - cath 1/20 with total occlusion of LAD with R to L collaterals.  - 1/22 S/P DES to proximal/mid LAD. - No s/s ischemia. - Continue high dose statin, aspirin, and plavix. - Having trouble with muscle pains on atorvastatin. Check CK. He would like to try on lipitor instead of switching to crestor due to cost. - Cannot afford cardiac rehab. Exercising 2 hours/day.  3. ETOH - Has rare glass of wine. Having a beer on Sunday while watching football. No change.  4. Former Tobacco Abuse - Congratulated on continued abstinence. No change.   Check BMET, CK Refer to Dr Ladona Ridgel for free ICD  Alford Highland, NP  11:58 AM   Patient seen and examined with the above-signed Advanced Practice Provider and/or Housestaff. I personally reviewed laboratory data, imaging studies and relevant notes. I independently examined the patient and formulated the important aspects of the plan. I have edited the note to reflect any of my changes or salient points. I have personally discussed the plan with the patient and/or family.  He is doing well from functional standpoint. NYHA II.  Volume status stable on sliding scale lasix. BP low but tolerating well. No room to titrate. Will refer to EP for ICD. ([employer] willing to donate a device) - d/w Gypsy Balsam, NP. No s/s ischemia. Check labs today.  Arvilla Meres, MD    1:00 PM

## 2018-11-18 NOTE — Patient Instructions (Signed)
Labs done today  You have been referred to cardiac electrophysiology. They will be in contact with you to schedule an appointment.  Follow up with Dr. Gala Romney in 3 months.

## 2018-11-23 MED FILL — CARVEDILOL 6.25 MG TABLET: 6.25 | 30 days supply | Qty: 60 | Fill #2

## 2018-11-23 MED FILL — SPIRONOLACTONE 25 MG TABLET: 25 | 30 days supply | Qty: 30 | Fill #2

## 2018-11-23 MED FILL — CLOPIDOGREL 75 MG TABLET: 75 | 30 days supply | Qty: 30 | Fill #3

## 2018-11-23 MED FILL — ATORVASTATIN 80 MG TABLET: 80 | 30 days supply | Qty: 30 | Fill #3

## 2018-12-13 ENCOUNTER — Emergency Department (HOSPITAL_COMMUNITY)
Admission: EM | Admit: 2018-12-13 | Discharge: 2018-12-13 | Disposition: A | Discharge status: Home / Self Care | Payer: Medicaid Other | Attending: Emergency Medicine | Admitting: Emergency Medicine

## 2018-12-13 ENCOUNTER — Emergency Department (HOSPITAL_COMMUNITY): Payer: Medicaid Other

## 2018-12-13 ENCOUNTER — Other Ambulatory Visit: Payer: Self-pay

## 2018-12-13 ENCOUNTER — Encounter (HOSPITAL_COMMUNITY): Payer: Self-pay | Admitting: Emergency Medicine

## 2018-12-13 DIAGNOSIS — I509 Heart failure, unspecified: Secondary | ICD-10-CM | POA: Diagnosis not present

## 2018-12-13 DIAGNOSIS — I2510 Atherosclerotic heart disease of native coronary artery without angina pectoris: Secondary | ICD-10-CM | POA: Insufficient documentation

## 2018-12-13 DIAGNOSIS — R0602 Shortness of breath: Secondary | ICD-10-CM

## 2018-12-13 DIAGNOSIS — Z79899 Other long term (current) drug therapy: Secondary | ICD-10-CM | POA: Insufficient documentation

## 2018-12-13 DIAGNOSIS — J45909 Unspecified asthma, uncomplicated: Secondary | ICD-10-CM | POA: Insufficient documentation

## 2018-12-13 DIAGNOSIS — Z87891 Personal history of nicotine dependence: Secondary | ICD-10-CM | POA: Insufficient documentation

## 2018-12-13 DIAGNOSIS — R072 Precordial pain: Secondary | ICD-10-CM | POA: Diagnosis not present

## 2018-12-13 DIAGNOSIS — R079 Chest pain, unspecified: Secondary | ICD-10-CM | POA: Diagnosis present

## 2018-12-13 DIAGNOSIS — Z7982 Long term (current) use of aspirin: Secondary | ICD-10-CM | POA: Insufficient documentation

## 2018-12-13 HISTORY — DX: Heart failure, unspecified: I50.9

## 2018-12-13 LAB — CBC WITH DIFFERENTIAL/PLATELET
ABS IMMATURE GRANULOCYTES: 0.02 10*3/uL (ref 0.00–0.07)
BASOS ABS: 0 10*3/uL (ref 0.0–0.1)
Basophils Relative: 0 %
Eosinophils Absolute: 0.1 10*3/uL (ref 0.0–0.5)
Eosinophils Relative: 1 %
HCT: 49.2 % (ref 39.0–52.0)
Hemoglobin: 15.8 g/dL (ref 13.0–17.0)
Immature Granulocytes: 0 %
Lymphocytes Relative: 26 %
Lymphs Abs: 2.6 10*3/uL (ref 0.7–4.0)
MCH: 28.3 pg (ref 26.0–34.0)
MCHC: 32.1 g/dL (ref 30.0–36.0)
MCV: 88.2 fL (ref 80.0–100.0)
Monocytes Absolute: 1 10*3/uL (ref 0.1–1.0)
Monocytes Relative: 10 %
Neutro Abs: 6.3 10*3/uL (ref 1.7–7.7)
Neutrophils Relative %: 63 %
Platelets: 300 10*3/uL (ref 150–400)
RBC: 5.58 MIL/uL (ref 4.22–5.81)
RDW: 12.6 % (ref 11.5–15.5)
WBC: 10.1 10*3/uL (ref 4.0–10.5)
nRBC: 0 % (ref 0.0–0.2)

## 2018-12-13 LAB — BASIC METABOLIC PANEL
Anion gap: 9 (ref 5–15)
BUN: 12 mg/dL (ref 6–20)
CHLORIDE: 100 mmol/L (ref 98–111)
CO2: 25 mmol/L (ref 22–32)
Calcium: 9.4 mg/dL (ref 8.9–10.3)
Creatinine, Ser: 0.99 mg/dL (ref 0.61–1.24)
GFR calc Af Amer: >60 mL/min (ref >60)
GFR calc non Af Amer: >60 mL/min (ref >60)
Glucose, Bld: 92 mg/dL (ref 70–99)
Potassium: 4 mmol/L (ref 3.5–5.1)
Sodium: 134 mmol/L — ABNORMAL LOW (ref 135–145)

## 2018-12-13 LAB — BRAIN NATRIURETIC PEPTIDE: B Natriuretic Peptide: 134.8 pg/mL — ABNORMAL HIGH (ref 0.0–100.0)

## 2018-12-13 LAB — I-STAT TROPONIN, ED
TROPONIN I, POC: 0.01 ng/mL (ref 0.00–0.08)
TROPONIN I, POC: 0 ng/mL (ref 0.00–0.08)

## 2018-12-13 LAB — D-DIMER, QUANTITATIVE: D-Dimer, Quant: <0.27 ug/mL-FEU (ref 0.00–0.50)

## 2018-12-13 MED ORDER — MORPHINE SULFATE (PF) 4 MG/ML IV SOLN
4.0000 mg | Freq: Once | INTRAVENOUS | Status: AC
  Administered 2018-12-13: 4 mg via INTRAVENOUS
  Filled 2018-12-13: qty 1

## 2018-12-13 MED ORDER — ASPIRIN 81 MG PO CHEW
243.0000 mg | CHEWABLE_TABLET | Freq: Once | ORAL | Status: AC
  Administered 2018-12-13: 243 mg via ORAL
  Filled 2018-12-13: qty 3

## 2018-12-13 MED ORDER — LIDOCAINE VISCOUS HCL 2 % MT SOLN
15.0000 mL | Freq: Once | OROMUCOSAL | Status: AC
  Administered 2018-12-13: 15 mL via ORAL
  Filled 2018-12-13: qty 15

## 2018-12-13 MED ORDER — NITROGLYCERIN 0.4 MG SL SUBL: 0.4000 mg | SUBLINGUAL_TABLET | SUBLINGUAL | Status: DC | PRN

## 2018-12-13 MED ORDER — ALUM & MAG HYDROXIDE-SIMETH 200-200-20 MG/5ML PO SUSP
30.0000 mL | Freq: Once | ORAL | Status: AC
  Administered 2018-12-13: 30 mL via ORAL
  Filled 2018-12-13: qty 30

## 2018-12-13 NOTE — ED Notes (Addendum)
Discharge instructions discussed with Pt. Pt verbalized understanding. Pt stable and ambulatory.    

## 2018-12-13 NOTE — Discharge Instructions (Addendum)
Your work up today has been reassuring. Continue taking all of your usual home medications. Call your cardiologist tomorrow morning to schedule a follow up appointment for as soon as possible this week for recheck of your symptoms. If anything changes or worsens, come back to the ER.

## 2018-12-13 NOTE — ED Provider Notes (Signed)
[facility] EMERGENCY DEPARTMENT Provider Note   CSN: [id number] Arrival date & time: 12/13/18  1659     History   Chief Complaint Chief Complaint  Patient presents with  . Chest Pain    HPI Sergio Hicks is a 37 y.o. male with a PMHx of obesity, asthma, systolic CHF (EF 40-10% in 07/2018) due to ischemic cardiomyopathy, CAD s/p OOH anterior STEMI with DES to proximal/mid LAD in 1/19, ETOH abuse, and tobacco abuse, who presents to the ED with complaints of chest pain that began last night.  Patient states that around midnight he was getting up to go to the bathroom when he started feeling lightheaded and having chest pain.  He took a nitroglycerin, potassium pill, and Lasix which helped.  He woke up this morning and still had some soreness in his chest but was feeling short of breath.  This persisted over the course of the day so he decided to come in for evaluation.  He states that he still has some mild intermittent lightheadedness occasionally.  He describes the pain in his chest as 5/10 intermittent soreness and tightness in the center of his chest, nonradiating, worse with breathing, and improved with the nitroglycerin that he took last night as well as the potassium and Lasix.  He reports associated shortness of breath, as well as orthopnea stating that he sat in a chair all day today which is abnormal.  He is a former smoker who quit 1 year ago.  He is adopted so is unsure of his family history.  He denies diaphoresis, fevers, chills, cough, wheezing, LE swelling, recent travel/surgery/immobilization, personal hx of DVT/PE, abd pain, N/V/D/C, hematuria, dysuria, myalgias, arthralgias, claudication, numbness, tingling, focal weakness, or any other complaints at this time. He is followed by Dr. Gala Romney of [facility]  Of note, some of his cardiac history obtained per cardiology notes from 11/18/2018: pt was admitted 01/18/2018 with OOH anterior MI and severe ICM. Taken urgently for  cath which showed 100% LAD occlusion as below.ECHO1/20/19 LVEF 30%. Initially treated medically but developed persistent CP so taken for PCI and pt is s/p DES to proximal/mid LAD 01/20/18.    The history is provided by the patient and medical records. No language interpreter was used.    Past Medical History:  Diagnosis Date  . Asthma   . CHF (congestive heart failure) (HCC) 12/2016    Patient Active Problem List   Diagnosis Date Noted  . CAD (coronary artery disease) 04/27/2018  . Obesity 01/26/2018  . ACS (acute coronary syndrome) (HCC) 01/18/2018  . Heart attack (HCC)   . Ischemic cardiomyopathy     Past Surgical History:  Procedure Laterality Date  . CORONARY STENT INTERVENTION N/A 01/20/2018   Procedure: CORONARY STENT INTERVENTION;  Surgeon: Swaziland, Peter M, MD;  Location: [facility] INVASIVE CV LAB;  Service: Cardiovascular;  Laterality: N/A;  . LEFT HEART CATH AND CORONARY ANGIOGRAPHY N/A 01/18/2018   Procedure: LEFT HEART CATH AND CORONARY ANGIOGRAPHY;  Surgeon: Runell Gess, MD;  Location: [facility] INVASIVE CV LAB;  Service: Cardiovascular;  Laterality: N/A;        Home Medications    Prior to Admission medications   Medication Sig Start Date End Date Taking? Authorizing Provider  albuterol (PROVENTIL HFA;VENTOLIN HFA) 108 (90 Base) MCG/ACT inhaler Inhale 1-2 puffs into the lungs every 6 (six) hours as needed for wheezing or shortness of breath.    [provider]  aspirin EC 81 MG EC tablet Take 1 tablet (81  mg total) by mouth daily. 01/23/18   Graciella Freer, PA-C  atorvastatin (LIPITOR) 80 MG tablet TAKE 1 TABLET BY MOUTH DAILY AT 6 PM. 08/03/18   Tillery, Mariam Dollar, PA-C  carvedilol (COREG) 6.25 MG tablet Take 1 tablet (6.25 mg total) by mouth 2 (two) times daily. 09/30/18   Graciella Freer, PA-C  clopidogrel (PLAVIX) 75 MG tablet Take 1 tablet (75 mg total) by mouth daily with breakfast. 08/03/18   Graciella Freer, PA-C  furosemide (LASIX)  40 MG tablet Take 1 tablet (40 mg total) by mouth as needed. Patient not taking: Reported on 11/18/2018 02/27/18   Bensimhon, Bevelyn Buckles, MD  nitroGLYCERIN (NITROSTAT) 0.4 MG SL tablet Place 1 tablet (0.4 mg total) under the tongue every 5 (five) minutes as needed for chest pain. Patient not taking: Reported on 11/18/2018 01/22/18   Graciella Freer, PA-C  potassium chloride SA (K-DUR,KLOR-CON) 20 MEQ tablet Take 40 mEq by mouth as needed. Only on days when you need to take furosemide    [provider]  sacubitril-valsartan (ENTRESTO) 49-51 MG Take 1 tablet by mouth 2 (two) times daily. 08/03/18   Bensimhon, Bevelyn Buckles, MD  spironolactone (ALDACTONE) 25 MG tablet Take 1 tablet (25 mg total) by mouth daily. 09/30/18   Graciella Freer, PA-C    Family History Family History  Adopted: Yes    Social History Social History   Tobacco Use  . Smoking status: Former Smoker    Packs/day: 0.00    Types: Cigarettes  . Smokeless tobacco: Never Used  Substance Use Topics  . Alcohol use: Yes    Alcohol/week: 1.0 standard drinks    Types: 1 Glasses of wine per week    Comment: 1-2 days a week   . Drug use: Never     Allergies   Patient has no known allergies.   Review of Systems Review of Systems  Constitutional: Negative for chills, diaphoresis and fever.  Respiratory: Positive for shortness of breath. Negative for cough and wheezing.   Cardiovascular: Positive for chest pain. Negative for leg swelling.  Gastrointestinal: Negative for abdominal pain, constipation, diarrhea, nausea and vomiting.  Genitourinary: Negative for dysuria and hematuria.  Musculoskeletal: Negative for arthralgias and myalgias.  Skin: Negative for color change.  Allergic/Immunologic: Negative for immunocompromised state.  Neurological: Positive for light-headedness. Negative for weakness and numbness.  Psychiatric/Behavioral: Negative for confusion.   All other systems reviewed and are negative  for acute change except as noted in the HPI.    Physical Exam Updated Vital Signs BP 115/68 (BP Location: Right Arm)   Pulse 69   Temp 97.8 F (36.6 C) (Oral)   Resp 15   Ht 6\' 3"  (1.905 m)   Wt 131.5 kg   SpO2 100%   BMI 36.25 kg/m   Physical Exam Vitals signs and nursing note reviewed.  Constitutional:      General: He is not in acute distress.    Appearance: Normal appearance. He is well-developed. He is not toxic-appearing.     Comments: Afebrile, nontoxic, NAD  HENT:     Head: Normocephalic and atraumatic.  Eyes:     General:        Right eye: No discharge.        Left eye: No discharge.     Conjunctiva/sclera: Conjunctivae normal.  Neck:     Musculoskeletal: Normal range of motion and neck supple.  Cardiovascular:     Rate and Rhythm: Normal rate and regular rhythm.  Heart sounds: Normal heart sounds, S1 normal and S2 normal. No murmur. No friction rub. No gallop.      Comments: RRR, nl s1/s2, no m/r/g, distal pulses intact, no pedal edema  Pulmonary:     Effort: Pulmonary effort is normal. No respiratory distress.     Breath sounds: Normal breath sounds. No decreased breath sounds, wheezing, rhonchi or rales.     Comments: CTAB in all lung fields, no w/r/r, no hypoxia or increased WOB, speaking in full sentences, SpO2 100% on RA  Chest:     Chest wall: No deformity, tenderness or crepitus.     Comments: Chest wall nonTTP without crepitus, deformities, or retractions  Abdominal:     General: Bowel sounds are normal. There is no distension.     Palpations: Abdomen is soft. Abdomen is not rigid.     Tenderness: There is no abdominal tenderness. There is no right CVA tenderness, left CVA tenderness, guarding or rebound. Negative signs include Murphy's sign and McBurney's sign.  Musculoskeletal: Normal range of motion.     Comments: MAE x4 Strength and sensation grossly intact in all extremities Distal pulses intact No pedal edema, neg homan's bilaterally     Skin:    General: Skin is warm and dry.     Findings: No rash.  Neurological:     Mental Status: He is alert and oriented to person, place, and time.     Sensory: Sensation is intact. No sensory deficit.     Motor: Motor function is intact.  Psychiatric:        Mood and Affect: Mood and affect normal.        Behavior: Behavior normal.      ED Treatments / Results  Labs (all labs ordered are listed, but only abnormal results are displayed) Labs Reviewed  BASIC METABOLIC PANEL - Abnormal; Notable for the following components:      Result Value   Sodium 134 (*)    All other components within normal limits  BRAIN NATRIURETIC PEPTIDE - Abnormal; Notable for the following components:   B Natriuretic Peptide 134.8 (*)    All other components within normal limits  CBC WITH DIFFERENTIAL/PLATELET  D-DIMER, QUANTITATIVE (NOT AT [facility])  I-STAT TROPONIN, ED  I-STAT TROPONIN, ED    EKG EKG Interpretation  Date/Time:  Sunday December 13 2018 17:12:46 EST Ventricular Rate:  70 PR Interval:    QRS Duration: 90 QT Interval:  418 QTC Calculation: 451 R Axis:   44 Text Interpretation:  Sinus arrhythmia Left atrial enlargement background noise and wander to aVL Otherwise no significant change Confirmed by Melene Plan [phone]) on 12/13/2018 5:26:40 PM   Radiology Dg Chest 2 View  Result Date: 12/13/2018 CLINICAL DATA:  Chest pain EXAM: CHEST - 2 VIEW COMPARISON:  01/18/2018 FINDINGS: Heart and mediastinal contours are within normal limits. No focal opacities or effusions. No acute bony abnormality. IMPRESSION: No active cardiopulmonary disease. Electronically Signed   By: Charlett Nose M.D.   On: 12/13/2018 18:12    Echo 08/17/18: Study Conclusions: - Left ventricle: The cavity size was normal. Systolic function was   severely reduced. The estimated ejection fraction was in the   range of 20% to 25%. Diffuse hypokinesis. There is dyskinesis of   the apical myocardium. - Mitral valve:  Mildly thickened leaflets . There was moderate   regurgitation. - Left atrium: The atrium was mildly dilated. Volume/bsa, ES,   (1-plane Simpson&'s, A2C): 37.5 ml/m^2.   LHC 01/18/18: Ost LAD  to Prox LAD lesion is 100% stenosed.   Coronary Stent Intervention Study 01/20/18: Conclusion:   Ost LAD to Prox LAD lesion is 100% stenosed.  Post intervention, there is a 0% residual stenosis.  A drug-eluting stent was successfully placed using a STENT SYNERGY DES 3X32.  A drug-eluting stent was successfully placed using a STENT SYNERGY DES 4X28.  Ost 1st Diag lesion is 90% stenosed.   1. Successful stenting of the proximal to mid LAD with overlapping DES stents.   Plan: DAPT for at least one year. Optimize medical therapy.     Procedures Procedures (including critical care time)  Medications Ordered in ED Medications  nitroGLYCERIN (NITROSTAT) SL tablet 0.4 mg (has no administration in time range)  morphine 4 MG/ML injection 4 mg (4 mg Intravenous Given 12/13/18 1827)  aspirin chewable tablet 243 mg (243 mg Oral Given 12/13/18 1827)  alum & mag hydroxide-simeth (MAALOX/MYLANTA) 200-200-20 MG/5ML suspension 30 mL (30 mLs Oral Given 12/13/18 1828)    And  lidocaine (XYLOCAINE) 2 % viscous mouth solution 15 mL (15 mLs Oral Given 12/13/18 1828)     Initial Impression / Assessment and Plan / ED Course  I have reviewed the triage vital signs and the nursing notes.  Pertinent labs & imaging results that were available during my care of the patient were reviewed by me and considered in my medical decision making (see chart for details).     37 y.o. male here with chest pain that began last night around midnight.  He also reports some shortness of breath and orthopnea as well as some intermittent lightheadedness.  On exam, no reproducible chest tenderness, clear lung exam, no pedal edema, no tachycardia or hypoxia, no increased work of breathing.  Overall well-appearing.  Vital signs  stable.  Will get labs including d-dimer given that he has pleuritic type chest pain but is at low risk for PE.  Will get chest x-ray.  EKG nonischemic.  Will give aspirin, morphine, nitro, and GI cocktail and reassess shortly.   7:40 PM CBC w/diff WNL. BMP WNL. BNP mildly elevated at 134.8. D-dimer negative, doubt PE. Trop negative. CXR negative.  Pt feeling slightly better after GI cocktail and morphine, no NTG was given because pt declined it. Will touch base with cardiology service to see if it's reasonable to get delta trop at 3hr mark and f/up in the office or if they would like to go an alternative route. Discussed case with my attending Dr. Adela Lank who agrees with plan.   8:15 PM Dr Daphine Deutscher of cardiology returning page, feels that if his second trop is negative and his symptoms are improving, then he could be discharged home and f/up closely; she'll put him on the list to have close f/up in their office. Will await second troponin then reassess.   9:32 PM Second trop negative. Overall, reassuring work up; doubt ACS, PE, CHF exacerbation, etc; doubt need for further emergent work up at this time. Advised pt f/up closely with cardiology this week for recheck of symptoms. Strict return precautions advised. I explained the diagnosis and have given explicit precautions to return to the ER including for any other new or worsening symptoms. The patient understands and accepts the medical plan as it's been dictated and I have answered their questions. Discharge instructions concerning home care and prescriptions have been given. The patient is STABLE and is discharged to home in good condition.    Final Clinical Impressions(s) / ED Diagnoses   Final diagnoses:  Precordial pain  SOB (shortness of breath)    ED Discharge Orders    [address] Hodgens, [place], [place] 12/13/18 2132    Melene Plan, DO 12/13/18 2322

## 2018-12-13 NOTE — ED Triage Notes (Addendum)
Pt reports dizziness and mild CP last night so he took 1 nitro, lasix and potassium. PT reports he was feeling better this morning but has been feeling swelling in his chest area throughout the day described as pressure and SOB.

## 2018-12-13 NOTE — ED Notes (Signed)
Patient transported to X-ray 

## 2018-12-15 ENCOUNTER — Telehealth (HOSPITAL_COMMUNITY): Payer: Self-pay | Admitting: Cardiology

## 2018-12-15 NOTE — Telephone Encounter (Signed)
-----   Message from Allayne Butcher, [place] sent at 12/14/2018  3:33 PM EST ----- Regarding: Needs AHF Clinic f/u The overnight fellow pt got paged about this DB pt who came in with CP. His troponin's were negative. He was discharged from the ED but advised to have close outpatient f/u with AHF clinic. Please call to arrange f/u.   Thanks.

## 2018-12-15 NOTE — Telephone Encounter (Signed)
As requested close follow up scheduled. Detailed message left with appt inofrmation

## 2018-12-18 ENCOUNTER — Telehealth: Payer: Self-pay | Admitting: *Deleted

## 2018-12-18 NOTE — Telephone Encounter (Signed)
Per device clinic we called to see pt can move his appt time to 12/31/18 @ 10:30 from 3:30. lmtcb [phone]

## 2018-12-21 MED FILL — CLOPIDOGREL 75 MG TABLET: 75 | 30 days supply | Qty: 30 | Fill #4

## 2018-12-21 MED FILL — ATORVASTATIN 80 MG TABLET: 80 | 30 days supply | Qty: 30 | Fill #4

## 2018-12-21 MED FILL — SPIRONOLACTONE 25 MG TABLET: 25 | 30 days supply | Qty: 30 | Fill #2

## 2018-12-24 ENCOUNTER — Encounter (HOSPITAL_COMMUNITY): Payer: Self-pay | Admitting: Internal Medicine

## 2018-12-24 ENCOUNTER — Ambulatory Visit (HOSPITAL_COMMUNITY)
Admission: RE | Admit: 2018-12-24 | Discharge: 2018-12-24 | Disposition: A | Discharge status: Home / Self Care | Payer: Medicaid Other | Source: Ambulatory Visit | Attending: Internal Medicine | Admitting: Internal Medicine

## 2018-12-24 VITALS — BP 110/62 | HR 77 | Wt 290.2 lb

## 2018-12-24 DIAGNOSIS — Z7902 Long term (current) use of antithrombotics/antiplatelets: Secondary | ICD-10-CM | POA: Insufficient documentation

## 2018-12-24 DIAGNOSIS — Z7982 Long term (current) use of aspirin: Secondary | ICD-10-CM | POA: Insufficient documentation

## 2018-12-24 DIAGNOSIS — E669 Obesity, unspecified: Secondary | ICD-10-CM | POA: Diagnosis not present

## 2018-12-24 DIAGNOSIS — Z955 Presence of coronary angioplasty implant and graft: Secondary | ICD-10-CM | POA: Diagnosis not present

## 2018-12-24 DIAGNOSIS — I252 Old myocardial infarction: Secondary | ICD-10-CM | POA: Insufficient documentation

## 2018-12-24 DIAGNOSIS — Z87891 Personal history of nicotine dependence: Secondary | ICD-10-CM | POA: Insufficient documentation

## 2018-12-24 DIAGNOSIS — I5022 Chronic systolic (congestive) heart failure: Secondary | ICD-10-CM | POA: Diagnosis not present

## 2018-12-24 DIAGNOSIS — I2510 Atherosclerotic heart disease of native coronary artery without angina pectoris: Secondary | ICD-10-CM | POA: Insufficient documentation

## 2018-12-24 DIAGNOSIS — Z79899 Other long term (current) drug therapy: Secondary | ICD-10-CM | POA: Insufficient documentation

## 2018-12-24 DIAGNOSIS — Z720 Tobacco use: Secondary | ICD-10-CM

## 2018-12-24 NOTE — Progress Notes (Signed)
PCP: Primary HF Cardiologist: Dr Gala Romney   Advanced HF Clinic Note   HPI: Sergio Hicks is a 37 y.o. 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   Admitted 01/18/2018 with OOH anterior MI and severe ICM. Taken urgently for cath which showed 100% LAD occlusion as below. ECHO1/20/19 LVEF 30%. Initially treated medically but developed persistent CP so taken for PCI and pt is s/p DES to proximal/mid LAD 01/20/18.  Seen in ED 12/13/18 for Chest pain and "swelling in his chest area". He had awakened at midnight to urinate, and had lightheadedness and chest pain. Mildly improved with NTG and Lasix which helped, but recurred in the am and persisted through the day prompting his visit. Troponin negative x 2. D-Dimer negative. WBC negative. He had a lot of anxiety, and was worried about his kidneys.   He presents today for post ER follow up as above. He felt like his chest was sore when he took a deep breath. He denies Cold, cough, URI, fever, or chills. Was worse with lying down. He had severe HA with it. Working less than part time at [employer] so he can keep his Medicaid. He hasn't changes his habits. Continues to go the gym most days a week. Does cardio, swimming, light weight for about 2 hours total. Denies lightheadedness or dizziness. No edema or orthopnea. No PND.    Echo 07/2018: EF 25-30%  Echo 4/19 EF 30-35%.   ECHO 01/18/2018 LVEF 30-35% with large LAD territory WMA concerning for   ischemia/infarct, grade 1 DD with elevated LV filling pressure,   trivial MR, mild LAE, elevated RA pressure.  Review of systems complete and found to be negative unless listed in HPI.    SH:  Social History   Socioeconomic History  . Marital status: Single    Spouse name: Not on file  . Number of children: Not on file  . Years of education: Not on file  . Highest education level: Not  on file  Occupational History  . Occupation: Investment banker, operational  Social Needs  . Financial resource strain: Not on file  . Food insecurity:    Worry: Not on file    Inability: Not on file  . Transportation needs:    Medical: Not on file    Non-medical: Not on file  Tobacco Use  . Smoking status: Former Smoker    Packs/day: 0.00    Types: Cigarettes  . Smokeless tobacco: Never Used  Substance and Sexual Activity  . Alcohol use: Yes    Alcohol/week: 1.0 standard drinks    Types: 1 Glasses of wine per week    Comment: 1-2 days a week   . Drug use: Never  . Sexual activity: Not on file  Lifestyle  . Physical activity:    Days per week: Not on file    Minutes per session: Not on file  . Stress: Not on file  Relationships  . Social connections:    Talks on phone: Not on file    Gets together: Not on file    Attends religious service: Not on file    Active member of club or organization: Not on file    Attends meetings of clubs or organizations: Not on file    Relationship status: Not on file  . Intimate partner violence:    Fear of current or ex partner: Not on file  Emotionally abused: Not on file    Physically abused: Not on file    Forced sexual activity: Not on file  Other Topics Concern  . Not on file  Social History Narrative  . Not on file   FH:  Family History  Adopted: Yes   Past Medical History:  Diagnosis Date  . Asthma   . CHF (congestive heart failure) (HCC) 12/2016   Current Outpatient Medications  Medication Sig Dispense Refill  . albuterol (PROVENTIL HFA;VENTOLIN HFA) 108 (90 Base) MCG/ACT inhaler Inhale 1-2 puffs into the lungs every 6 (six) hours as needed for wheezing or shortness of breath.    Marland Kitchen. aspirin EC 81 MG EC tablet Take 1 tablet (81 mg total) by mouth daily. 30 tablet 6  . atorvastatin (LIPITOR) 80 MG tablet TAKE 1 TABLET BY MOUTH DAILY AT 6 PM. (Patient taking differently: Take 80 mg by mouth daily. ) 30 tablet 2  . carvedilol (COREG) 6.25 MG  tablet Take 1 tablet (6.25 mg total) by mouth 2 (two) times daily. 60 tablet 2  . clopidogrel (PLAVIX) 75 MG tablet Take 1 tablet (75 mg total) by mouth daily with breakfast. 30 tablet 2  . furosemide (LASIX) 40 MG tablet Take 1 tablet (40 mg total) by mouth as needed. 30 tablet   . nitroGLYCERIN (NITROSTAT) 0.4 MG SL tablet Place 1 tablet (0.4 mg total) under the tongue every 5 (five) minutes as needed for chest pain. 60 tablet 0  . potassium chloride SA (K-DUR,KLOR-CON) 20 MEQ tablet Take 40 mEq by mouth as needed. Only on days when you need to take furosemide    . spironolactone (ALDACTONE) 25 MG tablet Take 1 tablet (25 mg total) by mouth daily. 30 tablet 2  . sacubitril-valsartan (ENTRESTO) 49-51 MG Take 1 tablet by mouth 2 (two) times daily. (Patient not taking: Reported on 12/24/2018) 60 tablet 11   No current facility-administered medications for this encounter.    Vitals:   12/24/18 1236  BP: 110/62  Pulse: 77  SpO2: 98%  Weight: 131.6 kg (290 lb 3.2 oz)   Wt Readings from Last 3 Encounters:  12/24/18 131.6 kg (290 lb 3.2 oz)  12/13/18 131.5 kg (290 lb)  11/18/18 133.1 kg (293 lb 6.4 oz)    PHYSICAL EXAM: General: Well appearing. No resp difficulty. HEENT: Normal Neck: Supple. JVP 5-6. Carotids 2+ bilat; no bruits. No thyromegaly or nodule noted. Cor: PMI nondisplaced. RRR, No M/G/R noted Lungs: CTAB, normal effort. Abdomen: Soft, non-tender, non-distended, no HSM. No bruits or masses. +BS  Extremities: No cyanosis, clubbing, or rash. R and LLE no edema.  Neuro: Alert & orientedx3, cranial nerves grossly intact. moves all 4 extremities w/o difficulty. Affect pleasant   EKG NSR 65 bpm, personally reviewed.   ASSESSMENT & PLAN: 1. 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  - Echo EF 4/19 30-35%.  - Echo 8/19 EF 25-30% Personally reviewed by Dr Gala RomneyBensimhon - NYHA II symptoms - Volume status stable on exam.   - Continue lasix prn. Takes  rarely. - Continue Entresto 49/51 mg BID. No BP room to increase, as he has been out of since 12/22/18 am. Will resume at previous dose . - Continue Carvedilol 6.25 mg BID - Continue spironolactone 25 mg daily - No longer wearing LifeVest. He sees Dr Ladona Ridgelaylor 12/31/18 for free ICD through [employer]. He would still have to pay about $130 every 3 months for remote monitoring. - Reinforced fluid restriction to < 2 L  daily, sodium restriction to less than 2000 mg daily, and the importance of daily weights.   - Going through disability appeal. Sister is a CSW and has been helping him. Has declined to see our HFCSW  2. CAD- out of hospital anterior MI with late presentation - cath 1/20 with total occlusion of LAD with R to L collaterals.  - 1/22 S/P DES to proximal/mid LAD. - No s/s of ischemia. Recent chest pain very unlikely to be cardiac in etiology.  - Continue high dose statin, aspirin, and plavix. - Continue Lipitor 80 mg daily. CK negative 11/18/18.  - Cannot afford cardiac rehab. Encouraged continued exercise.   3. ETOH - Has occasional ETOH (rare glass of wine or a beer no Football Sunday)   4. Former Tobacco Abuse - Congratulated on continued abstinence.  - No change.   Doing well overall. Keep follow up at end of February. Will likely have ICD by then.   Graciella Freer, PA-C  12:48 PM   Patient seen and examined with the above-signed Advanced Practice Provider and/or Housestaff. I personally reviewed laboratory data, imaging studies and relevant notes. I independently examined the patient and formulated the important aspects of the plan. I have edited the note to reflect any of my changes or salient points. I have personally discussed the plan with the patient and/or family.  He is here for post ER f/u. Had CP and SOB. Concern for repeat ACS. Fortnately ECG and troponin ok. Etiology unclear. Now feeling better. Has returned to the gym. Volume status ok. Has been out of  Entresto for 2 days. Will give him samples today. Has appt to get ICD soon.   Arvilla Meres, MD  1:29 PM

## 2018-12-24 NOTE — Progress Notes (Signed)
1 bottle of Entresto 49/51 given. Lot # [id number]. Expiration date 3/22.

## 2018-12-24 NOTE — Patient Instructions (Signed)
You have been given1 bottle of Entresto 49/51 samples.   Keep your appointment on 02/19/2019.

## 2018-12-31 ENCOUNTER — Encounter: Payer: Self-pay | Admitting: Internal Medicine

## 2018-12-31 ENCOUNTER — Ambulatory Visit (INDEPENDENT_AMBULATORY_CARE_PROVIDER_SITE_OTHER): Payer: Self-pay | Admitting: Internal Medicine

## 2018-12-31 VITALS — BP 102/68 | HR 55 | Ht 75.0 in | Wt 291.0 lb

## 2018-12-31 DIAGNOSIS — I255 Ischemic cardiomyopathy: Secondary | ICD-10-CM

## 2018-12-31 NOTE — Patient Instructions (Addendum)
Medication Instructions:  Your physician recommends that you continue on your current medications as directed. Please refer to the Current Medication list given to you today.  Labwork: None ordered.  Testing/Procedures: Your physician has recommended that you have a defibrillator inserted. An implantable cardioverter defibrillator (ICD) is a small device that is placed in your chest or, in rare cases, your abdomen. This device uses electrical pulses or shocks to help control life-threatening, irregular heartbeats that could lead the heart to suddenly stop beating (sudden cardiac arrest). Leads are attached to the ICD that goes into your heart. This is done in the hospital and usually requires an overnight stay. Please see the instruction sheet given to you today for more information.  Follow-Up:  The following days are available for procedures:  January 3, 6, 9, 16, 17, 20, 21, 29, 31 February 3, 5, 10, 13, 17, 20, 24, 26  If you decide on a day please give me a call:  Dierdre Highman [phone]   Any Other Special Instructions Will Be Listed Below (If Applicable).  If you need a refill on your cardiac medications before your next appointment, please call your pharmacy.     Cardioverter Defibrillator Implantation  An implantable cardioverter defibrillator (ICD) is a small device that is placed under the skin in the chest or abdomen. An ICD consists of a battery, a small computer (pulse generator), and wires (leads) that go into the heart. An ICD is used to detect and correct two types of dangerous irregular heartbeats (arrhythmias):  A rapid heart rhythm (tachycardia).  An arrhythmia in which the lower chambers of the heart (ventricles) contract in an uncoordinated way (fibrillation). When an ICD detects tachycardia, it sends a low-energy shock to the heart to restore the heartbeat to normal (cardioversion). This signal is usually painless. If cardioversion does not work or if the ICD  detects fibrillation, it delivers a high-energy shock to the heart (defibrillation) to restart the heart. This shock may feel like a strong jolt in the chest. Your health care provider may prescribe an ICD if:  You have had an arrhythmia that originated in the ventricles.  Your heart has been damaged by a disease or heart condition. Sometimes, ICDs are programmed to act as a device called a pacemaker. Pacemakers can be used to treat a slow heartbeat (bradycardia) or tachycardia by taking over the heart rate with electrical impulses. Tell a health care provider about:  Any allergies you have.  All medicines you are taking, including vitamins, herbs, eye drops, creams, and over-the-counter medicines.  Any problems you or family members have had with anesthetic medicines.  Any blood disorders you have.  Any surgeries you have had.  Any medical conditions you have.  Whether you are pregnant or may be pregnant. What are the risks? Generally, this is a safe procedure. However, problems may occur, including:  Swelling, bleeding, or bruising.  Infection.  Blood clots.  Damage to other structures or organs, such as nerves, blood vessels, or the heart.  Allergic reactions to medicines used during the procedure. What happens before the procedure? Staying hydrated Follow instructions from your health care provider about hydration, which may include:  Up to 2 hours before the procedure - you may continue to drink clear liquids, such as water, clear fruit juice, black coffee, and plain tea. Eating and drinking restrictions Follow instructions from your health care provider about eating and drinking, which may include:  8 hours before the procedure - stop eating heavy meals  or foods such as meat, fried foods, or fatty foods.  6 hours before the procedure - stop eating light meals or foods, such as toast or cereal.  6 hours before the procedure - stop drinking milk or drinks that  contain milk.  2 hours before the procedure - stop drinking clear liquids. Medicine Ask your health care provider about:  Changing or stopping your normal medicines. This is important if you take diabetes medicines or blood thinners.  Taking medicines such as aspirin and ibuprofen. These medicines can thin your blood. Do not take these medicines before your procedure if your doctor tells you not to. Tests  You may have blood tests.  You may have a test to check the electrical signals in your heart (electrocardiogram, ECG).  You may have imaging tests, such as a chest X-ray. General instructions  For 24 hours before the procedure, stop using products that contain nicotine or tobacco, such as cigarettes and e-cigarettes. If you need help quitting, ask your health care provider.  Plan to have someone take you home from the hospital or clinic.  You may be asked to shower with a germ-killing soap. What happens during the procedure?  To reduce your risk of infection: ? Your health care team will wash or sanitize their hands. ? Your skin will be washed with soap. ? Hair may be removed from the surgical area.  Small monitors will be put on your body. They will be used to check your heart, blood pressure, and oxygen level.  An IV tube will be inserted into one of your veins.  You will be given one or more of the following: ? A medicine to help you relax (sedative). ? A medicine to numb the area (local anesthetic). ? A medicine to make you fall asleep (general anesthetic).  Leads will be guided through a blood vessel into your heart and attached to your heart muscles. Depending on the ICD, the leads may go into one ventricle or they may go into both ventricles and into an upper chamber of the heart. An X-ray machine (fluoroscope) will be usedto help guide the leads.  A small incision will be made to create a deep pocket under your skin.  The pulse generator will be placed into the  pocket.  The ICD will be tested.  The incision will be closed with stitches (sutures), skin glue, or staples.  A bandage (dressing) will be placed over the incision. This procedure may vary among health care providers and hospitals. What happens after the procedure?  Your blood pressure, heart rate, breathing rate, and blood oxygen level will be monitored often until the medicines you were given have worn off.  A chest X-ray will be taken to check that the ICD is in the right place.  You will need to stay in the hospital for 1-2 days so your health care provider can make sure your ICD is working.  Do not drive for 24 hours if you received a sedative. Ask your health care provider when it is safe for you to drive.  You may be given an identification card explaining that you have an ICD. Summary  An implantable cardioverter defibrillator (ICD) is a small device that is placed under the skin in the chest or abdomen. It is used to detect and correct dangerous irregular heartbeats (arrhythmias).  An ICD consists of a battery, a small computer (pulse generator), and wires (leads) that go into the heart.  When an ICD detects rapid  heart rhythm (tachycardia), it sends a low-energy shock to the heart to restore the heartbeat to normal (cardioversion). If cardioversion does not work or if the ICD detects uncoordinated heart contractions (fibrillation), it delivers a high-energy shock to the heart (defibrillation) to restart the heart.  You will need to stay in the hospital for 1-2 days to make sure your ICD is working. This information is not intended to replace advice given to you by your health care provider. Make sure you discuss any questions you have with your health care provider. Document Released: 09/07/2002 Document Revised: 12/25/2016 Document Reviewed: 12/25/2016 Elsevier Interactive Patient Education  2019 [employer].

## 2018-12-31 NOTE — Progress Notes (Signed)
HPI Sergio Hicks is referred today by Dr. Dorthea Cove for consideration of an ICD insertion. 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 He has class 2 symptoms and his EF is 20%. His QRS is narrow. He has not had syncope or peripheral edema.  No Known Allergies   Current Outpatient Medications  Medication Sig Dispense Refill  . albuterol (PROVENTIL HFA;VENTOLIN HFA) 108 (90 Base) MCG/ACT inhaler Inhale 1-2 puffs into the lungs every 6 (six) hours as needed for wheezing or shortness of breath.    Marland Kitchen aspirin EC 81 MG EC tablet Take 1 tablet (81 mg total) by mouth daily. 30 tablet 6  . atorvastatin (LIPITOR) 80 MG tablet TAKE 1 TABLET BY MOUTH DAILY AT 6 PM. (Patient taking differently: Take 80 mg by mouth daily. ) 30 tablet 2  . carvedilol (COREG) 6.25 MG tablet Take 1 tablet (6.25 mg total) by mouth 2 (two) times daily. 60 tablet 2  . clopidogrel (PLAVIX) 75 MG tablet Take 1 tablet (75 mg total) by mouth daily with breakfast. 30 tablet 2  . furosemide (LASIX) 40 MG tablet Take 1 tablet (40 mg total) by mouth as needed. 30 tablet   . nitroGLYCERIN (NITROSTAT) 0.4 MG SL tablet Place 1 tablet (0.4 mg total) under the tongue every 5 (five) minutes as needed for chest pain. 60 tablet 0  . potassium chloride SA (K-DUR,KLOR-CON) 20 MEQ tablet Take 40 mEq by mouth as needed. Only on days when you need to take furosemide    . sacubitril-valsartan (ENTRESTO) 49-51 MG Take 1 tablet by mouth 2 (two) times daily. 60 tablet 11  . spironolactone (ALDACTONE) 25 MG tablet Take 1 tablet (25 mg total) by mouth daily. 30 tablet 2   No current facility-administered medications for this visit.      Past Medical History:  Diagnosis Date  . Asthma   . CHF (congestive heart failure) (HCC) 12/2016    ROS:   All systems reviewed and negative except as noted in the HPI.   Past Surgical History:  Procedure Laterality Date  . CORONARY STENT INTERVENTION N/A  01/20/2018   Procedure: CORONARY STENT INTERVENTION;  Surgeon: Swaziland, Peter M, MD;  Location: [facility] INVASIVE CV LAB;  Service: Cardiovascular;  Laterality: N/A;  . LEFT HEART CATH AND CORONARY ANGIOGRAPHY N/A 01/18/2018   Procedure: LEFT HEART CATH AND CORONARY ANGIOGRAPHY;  Surgeon: Runell Gess, MD;  Location: [facility] INVASIVE CV LAB;  Service: Cardiovascular;  Laterality: N/A;     Family History  Adopted: Yes     Social History   Socioeconomic History  . Marital status: Single    Spouse name: Not on file  . Number of children: Not on file  . Years of education: Not on file  . Highest education level: Not on file  Occupational History  . Occupation: Investment banker, operational  Social Needs  . Financial resource strain: Not on file  . Food insecurity:    Worry: Not on file    Inability: Not on file  . Transportation needs:    Medical: Not on file    Non-medical: Not on file  Tobacco Use  . Smoking status: Former Smoker    Packs/day: 0.00    Types: Cigarettes  . Smokeless tobacco: Never Used  Substance and Sexual Activity  . Alcohol use: Yes    Alcohol/week: 1.0 standard drinks    Types: 1 Glasses of wine per week    Comment:  1-2 days a week   . Drug use: Never  . Sexual activity: Not on file  Lifestyle  . Physical activity:    Days per week: Not on file    Minutes per session: Not on file  . Stress: Not on file  Relationships  . Social connections:    Talks on phone: Not on file    Gets together: Not on file    Attends religious service: Not on file    Active member of club or organization: Not on file    Attends meetings of clubs or organizations: Not on file    Relationship status: Not on file  . Intimate partner violence:    Fear of current or ex partner: Not on file    Emotionally abused: Not on file    Physically abused: Not on file    Forced sexual activity: Not on file  Other Topics Concern  . Not on file  Social History Narrative  . Not on file     BP 102/68   Pulse  (!) 55   Ht 6\' 3"  (1.905 m)   Wt 291 lb (132 kg)   SpO2 98%   BMI 36.37 kg/m   Physical Exam:  Well appearing young man, NAD HEENT: Unremarkable Neck:  6 cm JVD, no thyromegally Lymphatics:  No adenopathy Back:  No CVA tenderness Lungs:  Clear with no wheezes HEART:  Regular rate rhythm, no murmurs, no rubs, no clicks Abd:  soft, positive bowel sounds, no organomegally, no rebound, no guarding Ext:  2 plus pulses, no edema, no cyanosis, no clubbing Skin:  No rashes no nodules Neuro:  CN II through XII intact, motor grossly intact   Assess/Plan: 1. Chronic systolic heart failure - his symptoms are class 2. He has persistent LV dysfunction. He is on maximal medical therapy. I have recommended ICD insertion. Because of his young age and primary prevention, I would recommend a S-ICD. He is pending medicaid acceptance and I have asked him to call us when he is approved and we will schedule. He would require anesthesia for the procedure. 2. CAD - he denies anginal symptoms yet has been active.  Leonia Reeves.D.

## 2019-01-05 ENCOUNTER — Encounter (HOSPITAL_COMMUNITY): Payer: Self-pay

## 2019-01-18 ENCOUNTER — Other Ambulatory Visit (HOSPITAL_COMMUNITY): Payer: Self-pay | Admitting: Student

## 2019-01-18 MED FILL — CARVEDILOL 6.25 MG TABLET: 6.25 | 30 days supply | Qty: 60 | Fill #0

## 2019-01-18 MED FILL — CLOPIDOGREL 75 MG TABLET: 75 | 30 days supply | Qty: 30 | Fill #5

## 2019-01-29 MED FILL — ATORVASTATIN 80 MG TABLET: 80 | 30 days supply | Qty: 30 | Fill #5

## 2019-02-04 ENCOUNTER — Telehealth: Payer: Self-pay | Admitting: Internal Medicine

## 2019-02-04 DIAGNOSIS — I255 Ischemic cardiomyopathy: Secondary | ICD-10-CM

## 2019-02-04 NOTE — Telephone Encounter (Signed)
New Message   Patient is calling to schedule to have his ICD implanted. Please call to discuss.

## 2019-02-08 NOTE — Telephone Encounter (Signed)
See MyChart thread.

## 2019-02-10 MED FILL — SPIRONOLACTONE 25 MG TABLET: 25 | 30 days supply | Qty: 30 | Fill #0

## 2019-02-11 ENCOUNTER — Other Ambulatory Visit: Payer: Medicaid Other | Admitting: *Deleted

## 2019-02-11 DIAGNOSIS — I255 Ischemic cardiomyopathy: Secondary | ICD-10-CM

## 2019-02-11 LAB — CBC WITH DIFFERENTIAL/PLATELET
BASOS: 0 %
Basophils Absolute: 0 10*3/uL (ref 0.0–0.2)
EOS (ABSOLUTE): 0.1 10*3/uL (ref 0.0–0.4)
Eos: 1 %
Hematocrit: 41.4 % (ref 37.5–51.0)
Hemoglobin: 14.5 g/dL (ref 13.0–17.7)
Immature Grans (Abs): 0 10*3/uL (ref 0.0–0.1)
Immature Granulocytes: 0 %
Lymphocytes Absolute: 2.3 10*3/uL (ref 0.7–3.1)
Lymphs: 33 %
MCH: 30.5 pg (ref 26.6–33.0)
MCHC: 35 g/dL (ref 31.5–35.7)
MCV: 87 fL (ref 79–97)
Monocytes Absolute: 0.6 10*3/uL (ref 0.1–0.9)
Monocytes: 8 %
Neutrophils Absolute: 4.1 10*3/uL (ref 1.4–7.0)
Neutrophils: 58 %
Platelets: 276 10*3/uL (ref 150–450)
RBC: 4.75 x10E6/uL (ref 4.14–5.80)
RDW: 12.9 % (ref 11.6–15.4)
WBC: 7 10*3/uL (ref 3.4–10.8)

## 2019-02-11 LAB — BASIC METABOLIC PANEL
BUN/Creatinine Ratio: 14 (ref 9–20)
BUN: 13 mg/dL (ref 6–20)
CO2: 20 mmol/L (ref 20–29)
Calcium: 9.2 mg/dL (ref 8.7–10.2)
Chloride: 103 mmol/L (ref 96–106)
Creatinine, Ser: 0.95 mg/dL (ref 0.76–1.27)
GFR calc Af Amer: 118 mL/min/{1.73_m2} (ref >59)
GFR, EST NON AFRICAN AMERICAN: 102 mL/min/{1.73_m2} (ref >59)
Glucose: 96 mg/dL (ref 65–99)
Potassium: 4.5 mmol/L (ref 3.5–5.2)
Sodium: 136 mmol/L (ref 134–144)

## 2019-02-16 MED ORDER — SODIUM CHLORIDE 0.9 % IV SOLN
80.0000 mg | INTRAVENOUS | Status: AC
  Administered 2019-02-17: 80 mg
  Filled 2019-02-16: qty 2

## 2019-02-16 MED ORDER — DEXTROSE 5 % IV SOLN
3.0000 g | INTRAVENOUS | Status: AC
  Administered 2019-02-17: 3 g via INTRAVENOUS
  Filled 2019-02-16: qty 3000
  Filled 2019-02-16: qty 3

## 2019-02-17 ENCOUNTER — Ambulatory Visit (HOSPITAL_COMMUNITY): Payer: Medicaid Other | Admitting: Registered Nurse

## 2019-02-17 ENCOUNTER — Other Ambulatory Visit: Payer: Self-pay

## 2019-02-17 ENCOUNTER — Encounter (HOSPITAL_COMMUNITY)
Admission: RE | Disposition: A | Discharge status: Home / Self Care | Payer: Self-pay | Source: Ambulatory Visit | Attending: Internal Medicine

## 2019-02-17 ENCOUNTER — Encounter (HOSPITAL_COMMUNITY): Payer: Self-pay | Admitting: General Practice

## 2019-02-17 ENCOUNTER — Ambulatory Visit (HOSPITAL_COMMUNITY)
Admission: RE | Admit: 2019-02-17 | Discharge: 2019-02-18 | Disposition: A | Discharge status: Home / Self Care | Payer: Medicaid Other | Source: Ambulatory Visit | Attending: Internal Medicine | Admitting: Internal Medicine

## 2019-02-17 DIAGNOSIS — Z87891 Personal history of nicotine dependence: Secondary | ICD-10-CM | POA: Diagnosis not present

## 2019-02-17 DIAGNOSIS — I255 Ischemic cardiomyopathy: Secondary | ICD-10-CM | POA: Insufficient documentation

## 2019-02-17 DIAGNOSIS — I2510 Atherosclerotic heart disease of native coronary artery without angina pectoris: Secondary | ICD-10-CM | POA: Diagnosis not present

## 2019-02-17 DIAGNOSIS — Z955 Presence of coronary angioplasty implant and graft: Secondary | ICD-10-CM | POA: Insufficient documentation

## 2019-02-17 DIAGNOSIS — Z7982 Long term (current) use of aspirin: Secondary | ICD-10-CM | POA: Diagnosis not present

## 2019-02-17 DIAGNOSIS — Z79899 Other long term (current) drug therapy: Secondary | ICD-10-CM | POA: Diagnosis not present

## 2019-02-17 DIAGNOSIS — I252 Old myocardial infarction: Secondary | ICD-10-CM | POA: Diagnosis not present

## 2019-02-17 DIAGNOSIS — Z006 Encounter for examination for normal comparison and control in clinical research program: Secondary | ICD-10-CM | POA: Insufficient documentation

## 2019-02-17 DIAGNOSIS — J45909 Unspecified asthma, uncomplicated: Secondary | ICD-10-CM | POA: Insufficient documentation

## 2019-02-17 DIAGNOSIS — I5022 Chronic systolic (congestive) heart failure: Secondary | ICD-10-CM | POA: Diagnosis present

## 2019-02-17 HISTORY — PX: SUBQ ICD IMPLANT: EP1223

## 2019-02-17 HISTORY — DX: Acute myocardial infarction, unspecified: I21.9

## 2019-02-17 HISTORY — DX: Atherosclerotic heart disease of native coronary artery without angina pectoris: I25.10

## 2019-02-17 LAB — SURGICAL PCR SCREEN
MRSA, PCR: NEGATIVE
Staphylococcus aureus: POSITIVE — AB

## 2019-02-17 SURGERY — SUBQ ICD IMPLANT
Anesthesia: General

## 2019-02-17 MED ORDER — YOU HAVE A PACEMAKER BOOK
Freq: Once | Status: AC
  Administered 2019-02-17: 19:00:00
  Filled 2019-02-17: qty 1

## 2019-02-17 MED ORDER — CARVEDILOL 3.125 MG PO TABS
6.2500 mg | ORAL_TABLET | Freq: Two times a day (BID) | ORAL | Status: DC
  Administered 2019-02-18: 09:00:00 6.25 mg via ORAL
  Filled 2019-02-17 (×2): qty 2

## 2019-02-17 MED ORDER — MUPIROCIN 2 % EX OINT
TOPICAL_OINTMENT | CUTANEOUS | Status: AC
  Administered 2019-02-17: 10:00:00
  Filled 2019-02-17: qty 22

## 2019-02-17 MED ORDER — SACUBITRIL-VALSARTAN 49-51 MG PO TABS
1.0000 | ORAL_TABLET | Freq: Two times a day (BID) | ORAL | Status: DC
  Administered 2019-02-18: 1 via ORAL
  Filled 2019-02-17 (×2): qty 1

## 2019-02-17 MED ORDER — PROPOFOL 10 MG/ML IV BOLUS
INTRAVENOUS | Status: DC | PRN
  Administered 2019-02-17: 50 mg via INTRAVENOUS

## 2019-02-17 MED ORDER — ONDANSETRON HCL 4 MG/2ML IJ SOLN: 4.0000 mg | Freq: Four times a day (QID) | INTRAMUSCULAR | Status: DC | PRN

## 2019-02-17 MED ORDER — SODIUM CHLORIDE 0.9 % IV SOLN
INTRAVENOUS | Status: AC
  Filled 2019-02-17: qty 2

## 2019-02-17 MED ORDER — BUPIVACAINE HCL (PF) 0.25 % IJ SOLN
INTRAMUSCULAR | Status: DC | PRN
  Administered 2019-02-17: 90 mL

## 2019-02-17 MED ORDER — MUPIROCIN 2 % EX OINT
1.0000 "application " | TOPICAL_OINTMENT | Freq: Once | CUTANEOUS | Status: DC
  Filled 2019-02-17: qty 22

## 2019-02-17 MED ORDER — VITAMIN D 25 MCG (1000 UNIT) PO TABS
1000.0000 [IU] | ORAL_TABLET | Freq: Every day | ORAL | Status: DC
  Administered 2019-02-17 – 2019-02-18 (×2): 1000 [IU] via ORAL
  Filled 2019-02-17 (×2): qty 1

## 2019-02-17 MED ORDER — CHLORHEXIDINE GLUCONATE CLOTH 2 % EX PADS
6.0000 | MEDICATED_PAD | Freq: Every day | CUTANEOUS | Status: DC
  Administered 2019-02-17: 23:00:00 6 via TOPICAL

## 2019-02-17 MED ORDER — FUROSEMIDE 40 MG PO TABS
40.0000 mg | ORAL_TABLET | Freq: Every day | ORAL | Status: DC
  Filled 2019-02-17: qty 1

## 2019-02-17 MED ORDER — ETOMIDATE 2 MG/ML IV SOLN
INTRAVENOUS | Status: DC | PRN
  Administered 2019-02-17: 20 mg via INTRAVENOUS

## 2019-02-17 MED ORDER — ALBUTEROL SULFATE (2.5 MG/3ML) 0.083% IN NEBU: 3.0000 mL | INHALATION_SOLUTION | Freq: Four times a day (QID) | RESPIRATORY_TRACT | Status: DC | PRN

## 2019-02-17 MED ORDER — MUPIROCIN 2 % EX OINT
1.0000 "application " | TOPICAL_OINTMENT | Freq: Two times a day (BID) | CUTANEOUS | Status: DC
  Administered 2019-02-17 – 2019-02-18 (×2): 1 via NASAL

## 2019-02-17 MED ORDER — ONDANSETRON HCL 4 MG/2ML IJ SOLN
INTRAMUSCULAR | Status: DC | PRN
  Administered 2019-02-17: 4 mg via INTRAVENOUS

## 2019-02-17 MED ORDER — SODIUM CHLORIDE 0.9 % IV SOLN
INTRAVENOUS | Status: DC | PRN
  Administered 2019-02-17: 15 ug/min via INTRAVENOUS

## 2019-02-17 MED ORDER — OXYCODONE HCL 5 MG PO TABS
5.0000 mg | ORAL_TABLET | Freq: Four times a day (QID) | ORAL | Status: DC | PRN
  Administered 2019-02-17: 22:00:00 5 mg via ORAL
  Filled 2019-02-17: qty 1

## 2019-02-17 MED ORDER — NITROGLYCERIN 0.4 MG SL SUBL: 0.4000 mg | SUBLINGUAL_TABLET | SUBLINGUAL | Status: DC | PRN

## 2019-02-17 MED ORDER — ASPIRIN EC 81 MG PO TBEC
81.0000 mg | DELAYED_RELEASE_TABLET | Freq: Every day | ORAL | Status: DC
  Administered 2019-02-17 – 2019-02-18 (×2): 81 mg via ORAL
  Filled 2019-02-17 (×2): qty 1

## 2019-02-17 MED ORDER — FENTANYL CITRATE (PF) 250 MCG/5ML IJ SOLN
INTRAMUSCULAR | Status: DC | PRN
  Administered 2019-02-17: 50 ug via INTRAVENOUS
  Administered 2019-02-17 (×2): 25 ug via INTRAVENOUS

## 2019-02-17 MED ORDER — CHLORHEXIDINE GLUCONATE 4 % EX LIQD
60.0000 mL | Freq: Once | CUTANEOUS | Status: DC
  Filled 2019-02-17: qty 60

## 2019-02-17 MED ORDER — CEFAZOLIN SODIUM-DEXTROSE 1-4 GM/50ML-% IV SOLN
1.0000 g | Freq: Four times a day (QID) | INTRAVENOUS | Status: AC
  Administered 2019-02-17 – 2019-02-18 (×3): 1 g via INTRAVENOUS
  Filled 2019-02-17 (×3): qty 50

## 2019-02-17 MED ORDER — SPIRONOLACTONE 25 MG PO TABS
25.0000 mg | ORAL_TABLET | Freq: Every day | ORAL | Status: DC
  Administered 2019-02-17 – 2019-02-18 (×2): 25 mg via ORAL
  Filled 2019-02-17 (×2): qty 1

## 2019-02-17 MED ORDER — ATORVASTATIN CALCIUM 80 MG PO TABS
80.0000 mg | ORAL_TABLET | Freq: Every day | ORAL | Status: DC
  Administered 2019-02-17: 80 mg via ORAL
  Filled 2019-02-17: qty 1

## 2019-02-17 MED ORDER — POTASSIUM CHLORIDE CRYS ER 20 MEQ PO TBCR: 40.0000 meq | EXTENDED_RELEASE_TABLET | Freq: Every day | ORAL | Status: DC | PRN

## 2019-02-17 MED ORDER — ROCURONIUM BROMIDE 10 MG/ML (PF) SYRINGE
PREFILLED_SYRINGE | INTRAVENOUS | Status: DC | PRN
  Administered 2019-02-17: 30 mg via INTRAVENOUS
  Administered 2019-02-17: 50 mg via INTRAVENOUS

## 2019-02-17 MED ORDER — SUGAMMADEX SODIUM 200 MG/2ML IV SOLN
INTRAVENOUS | Status: DC | PRN
  Administered 2019-02-17: 200 mg via INTRAVENOUS

## 2019-02-17 MED ORDER — ACETAMINOPHEN 325 MG PO TABS
325.0000 mg | ORAL_TABLET | ORAL | Status: DC | PRN
  Administered 2019-02-17: 19:00:00 650 mg via ORAL
  Filled 2019-02-17: qty 2

## 2019-02-17 MED ORDER — BUPIVACAINE HCL (PF) 0.25 % IJ SOLN
INTRAMUSCULAR | Status: AC
  Filled 2019-02-17: qty 120

## 2019-02-17 MED ORDER — EPHEDRINE SULFATE-NACL 50-0.9 MG/10ML-% IV SOSY
PREFILLED_SYRINGE | INTRAVENOUS | Status: DC | PRN
  Administered 2019-02-17: 5 mg via INTRAVENOUS

## 2019-02-17 MED ORDER — HEPARIN (PORCINE) IN NACL 1000-0.9 UT/500ML-% IV SOLN
INTRAVENOUS | Status: DC | PRN
  Administered 2019-02-17: 500 mL

## 2019-02-17 MED ORDER — MIDAZOLAM HCL 5 MG/5ML IJ SOLN
INTRAMUSCULAR | Status: DC | PRN
  Administered 2019-02-17: 2 mg via INTRAVENOUS

## 2019-02-17 MED ORDER — SODIUM CHLORIDE 0.9 % IV SOLN
INTRAVENOUS | Status: DC
  Administered 2019-02-17 (×2): via INTRAVENOUS

## 2019-02-17 MED ORDER — SUCCINYLCHOLINE CHLORIDE 200 MG/10ML IV SOSY
PREFILLED_SYRINGE | INTRAVENOUS | Status: DC | PRN
  Administered 2019-02-17: 100 mg via INTRAVENOUS

## 2019-02-17 MED ORDER — DEXAMETHASONE SODIUM PHOSPHATE 10 MG/ML IJ SOLN
INTRAMUSCULAR | Status: DC | PRN
  Administered 2019-02-17: 10 mg via INTRAVENOUS

## 2019-02-17 MED ORDER — HEPARIN (PORCINE) IN NACL 1000-0.9 UT/500ML-% IV SOLN
INTRAVENOUS | Status: AC
  Filled 2019-02-17: qty 500

## 2019-02-17 SURGICAL SUPPLY — 4 items
ICD SUBCU MRI EMBLEM A219 (ICD Generator) ×2 IMPLANT
LEAD SUBQU EMBLEM 3501 (Pacemaker) ×2 IMPLANT
PAD PRO RADIOLUCENT 2001M-C (PAD) ×4
TRAY PACEMAKER INSERTION (PACKS) ×2

## 2019-02-17 NOTE — H&P (Signed)
HPI Sergio Hicks is referred today by Dr. Dorthea Cove for consideration of an ICD insertion. 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 He has class 2 symptoms and his EF is 20%. His QRS is narrow. He has not had syncope or peripheral edema.  No Known Allergies         Current Outpatient Medications  Medication Sig Dispense Refill  . albuterol (PROVENTIL HFA;VENTOLIN HFA) 108 (90 Base) MCG/ACT inhaler Inhale 1-2 puffs into the lungs every 6 (six) hours as needed for wheezing or shortness of breath.    Marland Kitchen aspirin EC 81 MG EC tablet Take 1 tablet (81 mg total) by mouth daily. 30 tablet 6  . atorvastatin (LIPITOR) 80 MG tablet TAKE 1 TABLET BY MOUTH DAILY AT 6 PM. (Patient taking differently: Take 80 mg by mouth daily. ) 30 tablet 2  . carvedilol (COREG) 6.25 MG tablet Take 1 tablet (6.25 mg total) by mouth 2 (two) times daily. 60 tablet 2  . clopidogrel (PLAVIX) 75 MG tablet Take 1 tablet (75 mg total) by mouth daily with breakfast. 30 tablet 2  . furosemide (LASIX) 40 MG tablet Take 1 tablet (40 mg total) by mouth as needed. 30 tablet   . nitroGLYCERIN (NITROSTAT) 0.4 MG SL tablet Place 1 tablet (0.4 mg total) under the tongue every 5 (five) minutes as needed for chest pain. 60 tablet 0  . potassium chloride SA (K-DUR,KLOR-CON) 20 MEQ tablet Take 40 mEq by mouth as needed. Only on days when you need to take furosemide    . sacubitril-valsartan (ENTRESTO) 49-51 MG Take 1 tablet by mouth 2 (two) times daily. 60 tablet 11  . spironolactone (ALDACTONE) 25 MG tablet Take 1 tablet (25 mg total) by mouth daily. 30 tablet 2   No current facility-administered medications for this visit.          Past Medical History:  Diagnosis Date  . Asthma   . CHF (congestive heart failure) (HCC) 12/2016    ROS:   All systems reviewed and negative except as noted in the HPI.        Past Surgical History:  Procedure Laterality Date  .  CORONARY STENT INTERVENTION N/A 01/20/2018   Procedure: CORONARY STENT INTERVENTION;  Surgeon: Swaziland, Peter M, MD;  Location: [facility] INVASIVE CV LAB;  Service: Cardiovascular;  Laterality: N/A;  . LEFT HEART CATH AND CORONARY ANGIOGRAPHY N/A 01/18/2018   Procedure: LEFT HEART CATH AND CORONARY ANGIOGRAPHY;  Surgeon: Runell Gess, MD;  Location: [facility] INVASIVE CV LAB;  Service: Cardiovascular;  Laterality: N/A;     Family History  Adopted: Yes     Social History        Socioeconomic History  . Marital status: Single    Spouse name: Not on file  . Number of children: Not on file  . Years of education: Not on file  . Highest education level: Not on file  Occupational History  . Occupation: Investment banker, operational  Social Needs  . Financial resource strain: Not on file  . Food insecurity:    Worry: Not on file    Inability: Not on file  . Transportation needs:    Medical: Not on file    Non-medical: Not on file  Tobacco Use  . Smoking status: Former Smoker    Packs/day: 0.00    Types: Cigarettes  . Smokeless tobacco: Never Used  Substance and Sexual Activity  . Alcohol use: Yes  Alcohol/week: 1.0 standard drinks    Types: 1 Glasses of wine per week    Comment: 1-2 days a week   . Drug use: Never  . Sexual activity: Not on file  Lifestyle  . Physical activity:    Days per week: Not on file    Minutes per session: Not on file  . Stress: Not on file  Relationships  . Social connections:    Talks on phone: Not on file    Gets together: Not on file    Attends religious service: Not on file    Active member of club or organization: Not on file    Attends meetings of clubs or organizations: Not on file    Relationship status: Not on file  . Intimate partner violence:    Fear of current or ex partner: Not on file    Emotionally abused: Not on file    Physically abused: Not on file    Forced sexual activity: Not on file  Other Topics  Concern  . Not on file  Social History Narrative  . Not on file     BP 102/68   Pulse (!) 55   Ht 6\' 3"  (1.905 m)   Wt 291 lb (132 kg)   SpO2 98%   BMI 36.37 kg/m   Physical Exam:  Well appearing young man, NAD HEENT: Unremarkable Neck:  6 cm JVD, no thyromegally Lymphatics:  No adenopathy Back:  No CVA tenderness Lungs:  Clear with no wheezes HEART:  Regular rate rhythm, no murmurs, no rubs, no clicks Abd:  soft, positive bowel sounds, no organomegally, no rebound, no guarding Ext:  2 plus pulses, no edema, no cyanosis, no clubbing Skin:  No rashes no nodules Neuro:  CN II through XII intact, motor grossly intact   Assess/Plan: 1. Chronic systolic heart failure - his symptoms are class 2. He has persistent LV dysfunction. He is on maximal medical therapy. I have recommended ICD insertion. Because of his young age and primary prevention, I would recommend a S-ICD. He is pending medicaid acceptance and I have asked him to call us when he is approved and we will schedule. He would require anesthesia for the procedure. 2. CAD - he denies anginal symptoms yet has been active.  Sergio Hicks.   EP Attending  Patient seen and examined. Agree with the findings as noted above. The patient presents today for ICD insertion. I have discussed the indications/risks/benefits of the procedure and he wishes to proceed.  Leonia Reeves.D.

## 2019-02-17 NOTE — Anesthesia Preprocedure Evaluation (Signed)
Anesthesia Evaluation  Patient identified by MRN, date of birth, ID band Patient awake    Reviewed: Allergy & Precautions, NPO status   Airway Mallampati: II  TM Distance: >3 FB     Dental   Pulmonary asthma , former smoker,    breath sounds clear to auscultation       Cardiovascular + CAD, + Past MI and +CHF   Rhythm:Regular Rate:Normal     Neuro/Psych    GI/Hepatic negative GI ROS, Neg liver ROS,   Endo/Other  negative endocrine ROS  Renal/GU negative Renal ROS     Musculoskeletal   Abdominal   Peds  Hematology   Anesthesia Other Findings   Reproductive/Obstetrics                             Anesthesia Physical Anesthesia Plan  ASA: IV  Anesthesia Plan: General   Post-op Pain Management:    Induction: Intravenous  PONV Risk Score and Plan: 2 and Ondansetron, Dexamethasone and Midazolam  Airway Management Planned: Oral ETT  Additional Equipment:   Intra-op Plan:   Post-operative Plan: Possible Post-op intubation/ventilation  Informed Consent: I have reviewed the patients History and Physical, chart, labs and discussed the procedure including the risks, benefits and alternatives for the proposed anesthesia with the patient or authorized representative who has indicated his/her understanding and acceptance.     Dental advisory given  Plan Discussed with: Anesthesiologist and CRNA  Anesthesia Plan Comments:         Anesthesia Quick Evaluation

## 2019-02-17 NOTE — Anesthesia Postprocedure Evaluation (Signed)
Anesthesia Post Note  Patient: Sergio Hicks  Procedure(s) Performed: SUBQ ICD IMPLANT (N/A )     Patient location during evaluation: PACU Anesthesia Type: General Level of consciousness: awake and alert Pain management: pain level controlled Vital Signs Assessment: post-procedure vital signs reviewed and stable Respiratory status: spontaneous breathing, nonlabored ventilation and respiratory function stable Cardiovascular status: blood pressure returned to baseline and stable Postop Assessment: no apparent nausea or vomiting Anesthetic complications: no    Last Vitals:  Vitals:   02/17/19 0917 02/17/19 1536  BP: (!) 109/58 (!) 96/56  Pulse: 67 76  Resp: 18 16  Temp: 36.7 C 36.6 C  SpO2: 97%     Last Pain:  Vitals:   02/17/19 1536  TempSrc: Temporal  PainSc: 0-No pain                 Shalamar Plourde,W. EDMOND

## 2019-02-17 NOTE — Anesthesia Procedure Notes (Addendum)
Procedure Name: Intubation Date/Time: 02/17/2019 12:53 PM Performed by: Zollie Scale, CRNA Pre-anesthesia Checklist: Patient identified, Emergency Drugs available, Suction available and Patient being monitored Patient Re-evaluated:Patient Re-evaluated prior to induction Oxygen Delivery Method: Circle System Utilized Preoxygenation: Pre-oxygenation with 100% oxygen Induction Type: IV induction Ventilation: Mask ventilation without difficulty Laryngoscope Size: Glidescope and 4 Grade View: Grade I Tube type: Oral Tube size: 7.5 mm Number of attempts: 1 Airway Equipment and Method: Stylet and Oral airway Placement Confirmation: ETT inserted through vocal cords under direct vision,  positive ETCO2 and breath sounds checked- equal and bilateral Secured at: 21 cm Tube secured with: Tape Dental Injury: Teeth and Oropharynx as per pre-operative assessment  Comments: Glidescope utilized d/t limited mouth opening, & prominent incisors. Patient ramped using blankets and in sniffing position.

## 2019-02-17 NOTE — Transfer of Care (Signed)
Immediate Anesthesia Transfer of Care Note  Patient: Sergio Hicks  Procedure(s) Performed: SUBQ ICD IMPLANT (N/A )  Patient Location: Cath Lab  Anesthesia Type:General  Level of Consciousness: awake, alert , oriented and patient cooperative  Airway & Oxygen Therapy: Patient Spontanous Breathing and Patient connected to nasal cannula oxygen  Post-op Assessment: Report given to RN, Post -op Vital signs reviewed and stable and Patient moving all extremities X 4  Post vital signs: Reviewed and stable  Last Vitals:  Vitals Value Taken Time  BP    Temp    Pulse    Resp    SpO2      Last Pain:  Vitals:   02/17/19 0933  TempSrc:   PainSc: 0-No pain      Patients Stated Pain Goal: 8 (02/17/19 0933)  Complications: No apparent anesthesia complications

## 2019-02-17 NOTE — Discharge Summary (Addendum)
ELECTROPHYSIOLOGY PROCEDURE DISCHARGE SUMMARY    Patient ID: Sergio Hicks,  MRN: [id number], DOB/AGE: [birth year] 37 y.o.  Admit date: 02/17/2019 Discharge date: 02/18/2019  Primary Care Physician: Hoy Register, MD  Primary Cardiologist: Dr. Gala Romney Electrophysiologist: Dr. Ladona Ridgel  Primary Discharge Diagnosis:  1. ICM  Secondary Discharge Diagnosis:  1. CAD 2. Chronic CHF (systolic)  No Known Allergies   Procedures This Admission:  1.  Implantation of a BSCi S-ICD on 02/17/2019 by Dr Ladona Ridgel.   DFT's were successful at 25 J.   There were no immediate post procedure complications.    Brief HPI: Sergio Hicks is a 38 y.o. male was referred to electrophysiology in the outpatient setting for consideration of ICD implantation.  Past medical history includes above.  The patient has persistent LV dysfunction despite guideline directed therapy.  Risks, benefits, and alternatives to ICD implantation were reviewed with the patient who wished to proceed.   Hospital Course:  The patient was admitted and underwent implantation of an ICD see procedure report for full details. He was monitored on telemetry overnight which demonstrated SR.  Left lateral chest and sternal wounds are without hematoma or ecchymosis, bleeding.  The device was interrogated post procedure last evening and found to be functioning normally.  Wound care was reviewed with the patient.  The patient feels well, he denies any significant pain at his sites.  he was examined by Dr. Ladona Ridgel and considered stable for discharge to home.   The patient's discharge medications include an ARB (Entresto) and beta blocker (carvedilol).   Physical Exam: Vitals:   02/17/19 1935 02/18/19 0451 02/18/19 0713 02/18/19 1141  BP: (!) 97/58 (!) 97/54 (!) 106/59 104/66  Pulse: 90 77 78 62  Resp: 16 20    Temp: 98.2 F (36.8 C) 97.8 F (36.6 C) 97.7 F (36.5 C) 98.2 F (36.8 C)  TempSrc: Oral Oral Oral Oral  SpO2: 98% 96% 98% 97%    Weight:  130.4 kg    Height:        GEN- The patient is well appearing, alert and oriented x 3 today.   HEENT: normocephalic, atraumatic; sclera clear, conjunctiva pink; hearing intact; oropharynx clear Lungs- CTA b/l, normal work of breathing.  No wheezes, rales, rhonchi Heart-  RRR, no murmurs, rubs or gallops, PMI not laterally displaced GI- soft, non-tender, non-distended Extremities- no clubbing, cyanosis, or edema MS- no significant deformity or atrophy Skin- warm and dry, no rash or lesion, left chest without hematoma/ecchymosis Psych- euthymic mood, full affect Neuro- no gross defecits  Labs:   Lab Results  Component Value Date   WBC 7.0 02/11/2019   HGB 14.5 02/11/2019   HCT 41.4 02/11/2019   MCV 87 02/11/2019   PLT 276 02/11/2019    No results for input(s): NA, K, CL, CO2, BUN, CREATININE, CALCIUM, PROT, BILITOT, ALKPHOS, ALT, AST, GLUCOSE in the last 168 hours.  Invalid input(s): LABALBU  Discharge Medications:  Allergies as of 02/18/2019   No Known Allergies     Medication List    TAKE these medications   albuterol 108 (90 Base) MCG/ACT inhaler Commonly known as:  PROVENTIL HFA;VENTOLIN HFA Inhale 1-2 puffs into the lungs every 6 (six) hours as needed for wheezing or shortness of breath. Notes to patient:   As needed for wheezing or shortness of breath   aspirin 81 MG EC tablet Take 1 tablet (81 mg total) by mouth daily. Notes to patient:  Prevents clotting in the stent and heart attack  atorvastatin 80 MG tablet Commonly known as:  LIPITOR TAKE 1 TABLET BY MOUTH DAILY AT 6 PM. What changed:    how much to take  how to take this  when to take this  additional instructions Notes to patient:  Lowers cholesterol and triglycerides   carvedilol 6.25 MG tablet Commonly known as:  COREG TAKE 1 TABLET BY MOUTH 2 TIMES DAILY. What changed:  when to take this Notes to patient:  Resume on Tuesday 02/23/2019 Decreases work of the heart  Decreases  heart rate and blood pressure  Beta-blocker   cholecalciferol 25 MCG (1000 UT) tablet Commonly known as:  VITAMIN D3 Take 1,000 Units by mouth daily. Notes to patient:  Supplement    clopidogrel 75 MG tablet Commonly known as:  PLAVIX Take 1 tablet (75 mg total) by mouth daily with breakfast. Notes to patient:  Resume on Tuesday 02/23/2019   furosemide 40 MG tablet Commonly known as:  LASIX Take 1 tablet (40 mg total) by mouth as needed. What changed:    when to take this  reasons to take this Notes to patient:  Fluid pill as needed    nitroGLYCERIN 0.4 MG SL tablet Commonly known as:  NITROSTAT Place 1 tablet (0.4 mg total) under the tongue every 5 (five) minutes as needed for chest pain. Notes to patient:  As needed for chest pain   potassium chloride SA 20 MEQ tablet Commonly known as:  K-DUR,KLOR-CON Take 40 mEq by mouth daily as needed (when taking furosemide). Notes to patient:  Potassium supplement as needed with furosemide   sacubitril-valsartan 49-51 MG Commonly known as:  ENTRESTO Take 1 tablet by mouth 2 (two) times daily. Notes to patient:  Blood pressure medication  Treat heart failure   spironolactone 25 MG tablet Commonly known as:  ALDACTONE Take 1 tablet (25 mg total) by mouth daily. Notes to patient:  Potassium sparing fluid pill Treat heart failure       Disposition:  Home  Discharge Instructions    Diet - low sodium heart healthy   Complete by:  As directed    Increase activity slowly   Complete by:  As directed      Follow-up Information    [facility] Follow up.   Specialty:  Cardiology Why:  03/02/2019 @ 9:30AM, wound check visit Contact information: [address], [address] [place] [address] [phone]       Marinus Maw, MD Follow up.   Specialty:  Cardiology Why:  05/25/2019 @ 3:15PM Contact information: [address] [place] [address] [phone]             Duration of Discharge Encounter: Greater than 30 minutes including physician time.  Norma Fredrickson, PA-C 02/18/2019 2:42 PM  EP Attending Patient seen and examined. Agree with above. The patient is stable for DC. Interrogation of his single chamber S-ICD demonstrates normal device function. I have asked him to hold his anti-platelet agents.   Leonia Reeves.D.

## 2019-02-18 ENCOUNTER — Encounter (HOSPITAL_COMMUNITY): Payer: Self-pay | Admitting: Internal Medicine

## 2019-02-18 DIAGNOSIS — I255 Ischemic cardiomyopathy: Secondary | ICD-10-CM | POA: Diagnosis not present

## 2019-02-18 NOTE — Discharge Instructions (Signed)
Implant site/wound care instructions Keep incision clean and dry for 7 days. No vigorous activities for 1 month No driving for 2 days.  You can remove outer dressing tomorrow. Leave steri-strips (little pieces of tape) on until seen in the office for wound check appointment. Call the office [phone]) for redness, drainage, swelling, or fever.

## 2019-02-19 ENCOUNTER — Encounter (HOSPITAL_COMMUNITY): Payer: Self-pay | Admitting: Internal Medicine

## 2019-02-25 ENCOUNTER — Ambulatory Visit (HOSPITAL_COMMUNITY)
Admission: RE | Admit: 2019-02-25 | Discharge: 2019-02-25 | Disposition: A | Discharge status: Home / Self Care | Payer: Medicaid Other | Source: Ambulatory Visit | Attending: Internal Medicine | Admitting: Internal Medicine

## 2019-02-25 ENCOUNTER — Other Ambulatory Visit: Payer: Self-pay

## 2019-02-25 ENCOUNTER — Encounter (HOSPITAL_COMMUNITY): Payer: Self-pay | Admitting: Internal Medicine

## 2019-02-25 VITALS — BP 104/78 | HR 74 | Wt 295.2 lb

## 2019-02-25 DIAGNOSIS — Z79899 Other long term (current) drug therapy: Secondary | ICD-10-CM | POA: Diagnosis not present

## 2019-02-25 DIAGNOSIS — Z7902 Long term (current) use of antithrombotics/antiplatelets: Secondary | ICD-10-CM | POA: Insufficient documentation

## 2019-02-25 DIAGNOSIS — I252 Old myocardial infarction: Secondary | ICD-10-CM | POA: Insufficient documentation

## 2019-02-25 DIAGNOSIS — Z87891 Personal history of nicotine dependence: Secondary | ICD-10-CM | POA: Insufficient documentation

## 2019-02-25 DIAGNOSIS — I2510 Atherosclerotic heart disease of native coronary artery without angina pectoris: Secondary | ICD-10-CM | POA: Diagnosis not present

## 2019-02-25 DIAGNOSIS — J45909 Unspecified asthma, uncomplicated: Secondary | ICD-10-CM | POA: Insufficient documentation

## 2019-02-25 DIAGNOSIS — I5022 Chronic systolic (congestive) heart failure: Secondary | ICD-10-CM | POA: Diagnosis present

## 2019-02-25 DIAGNOSIS — Z7982 Long term (current) use of aspirin: Secondary | ICD-10-CM | POA: Diagnosis not present

## 2019-02-25 DIAGNOSIS — Z955 Presence of coronary angioplasty implant and graft: Secondary | ICD-10-CM | POA: Insufficient documentation

## 2019-02-25 MED ORDER — SACUBITRIL-VALSARTAN 49-51 MG PO TABS: 1.0000 | ORAL_TABLET | Freq: Two times a day (BID) | ORAL | 11 refills | Status: DC

## 2019-02-25 NOTE — Progress Notes (Signed)
CSW consulted because patient brought in [employer] application for patient assistance.  CSW met with pt who states he now has medicaid but is unsure what kind of medicaid he has.  CSW called pt pharmacy who reports that medications have been successfully processed through his insurance so he will be able to now get Entresto through the outpatient pharmacy- pt expressed understanding.  Pt also wondering what is going on with his disability claim.  States he applied back in Sept. 2019 but hasn't heard anything back- requests that all questions and updates be discussed with his sister who is helping him with this process.  CSW called [facility] who states that determination has been made but that pt has to get this information from his local office.  CSW called pt sister to inform.  Pt sister will assist patient in calling social security to find out what happened regarding determination.  Sister mentioned that pt changed addresses recently and CSW informed this may have contributed to not hearing a response or not receiving requests for necessary information.  CSW will continue to follow and assist as needed  Jorge Ny, LCSW Clinical Social Worker [facility] [phone]

## 2019-02-25 NOTE — Patient Instructions (Signed)
Follow up with Dr. Bensimhon in 4 months 

## 2019-02-25 NOTE — Progress Notes (Signed)
PCP: Primary HF Cardiologist: Dr Gala Romney   Advanced HF Clinic Note   HPI: Sergio Hicks is a 38 y.o. 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   Admitted 01/18/2018 with OOH anterior MI and severe ICM. Taken urgently for cath which showed 100% LAD occlusion as below. ECHO1/20/19 LVEF 30%. Initially treated medically but developed persistent CP so taken for PCI and pt is s/p DES to proximal/mid LAD 01/20/18.  Seen in ED 12/13/18 for Chest pain and "swelling in his chest area". He had awakened at midnight to urinate, and had lightheadedness and chest pain. Mildly improved with NTG and Lasix which helped, but recurred in the am and persisted through the day prompting his visit. Troponin negative x 2. D-Dimer negative. WBC negative. He had a lot of anxiety, and was worried about his kidneys.   S/p SQ [employer] ICD with Dr Ladona Ridgel on 02/17/19.  He returns today for regular follow up. Overall doing well. Wants to take shower. Sees EP for wound check on Tuesday. No drainage, fever, or chills. Has some numbness at SQ site on left side. Since last week, has been a little bit more fatigued. He thinks it is because he is restricted and been less active. Denies SOB, edema, or orthopnea. Rare cough. He has had two episodes of dizziness with standing since ICD implant.Taking all medications. Able to get Entresto. Now has medicaid. Not weighing regularly, but around ~280 lbs last time he checked. Up 5 lbs on our scale. Limits fluid and salt.    Echo 07/2018: EF 25-30%  Echo 4/19 EF 30-35%.   ECHO 01/18/2018 LVEF 30-35% with large LAD territory WMA concerning for   ischemia/infarct, grade 1 DD with elevated LV filling pressure,   trivial MR, mild LAE, elevated RA pressure.  Review of systems complete and found to be negative unless listed in HPI.   SH:  Social History    Socioeconomic History  . Marital status: Single    Spouse name: Not on file  . Number of children: Not on file  . Years of education: Not on file  . Highest education level: Not on file  Occupational History  . Occupation: Investment banker, operational  Social Needs  . Financial resource strain: Not on file  . Food insecurity:    Worry: Not on file    Inability: Not on file  . Transportation needs:    Medical: Not on file    Non-medical: Not on file  Tobacco Use  . Smoking status: Former Smoker    Packs/day: 0.00    Types: Cigarettes  . Smokeless tobacco: Never Used  . Tobacco comment: 2019  Substance and Sexual Activity  . Alcohol use: Yes    Alcohol/week: 1.0 standard drinks    Types: 1 Glasses of wine per week    Comment: 1-2 days a week   . Drug use: Never  . Sexual activity: Not on file  Lifestyle  . Physical activity:    Days per week: Not on file    Minutes per session: Not on file  . Stress: Not on file  Relationships  . Social connections:    Talks on phone: Not on file    Gets together: Not on file    Attends religious service: Not on file    Active member of club or organization: Not on file    Attends meetings of  clubs or organizations: Not on file    Relationship status: Not on file  . Intimate partner violence:    Fear of current or ex partner: Not on file    Emotionally abused: Not on file    Physically abused: Not on file    Forced sexual activity: Not on file  Other Topics Concern  . Not on file  Social History Narrative  . Not on file   FH:  Family History  Adopted: Yes   Past Medical History:  Diagnosis Date  . Asthma   . CHF (congestive heart failure) (HCC) 12/2016  . Coronary artery disease   . Myocardial infarction [facility])    Current Outpatient Medications  Medication Sig Dispense Refill  . albuterol (PROVENTIL HFA;VENTOLIN HFA) 108 (90 Base) MCG/ACT inhaler Inhale 1-2 puffs into the lungs every 6 (six) hours as needed for wheezing or shortness of  breath.    Marland Kitchen aspirin EC 81 MG EC tablet Take 1 tablet (81 mg total) by mouth daily. 30 tablet 6  . atorvastatin (LIPITOR) 80 MG tablet TAKE 1 TABLET BY MOUTH DAILY AT 6 PM. (Patient taking differently: Take 80 mg by mouth daily. ) 30 tablet 2  . carvedilol (COREG) 6.25 MG tablet TAKE 1 TABLET BY MOUTH 2 TIMES DAILY. (Patient taking differently: Take 6.25 mg by mouth 2 (two) times daily with a meal. ) 60 tablet 2  . cholecalciferol (VITAMIN D3) 25 MCG (1000 UT) tablet Take 1,000 Units by mouth daily.    . clopidogrel (PLAVIX) 75 MG tablet Take 1 tablet (75 mg total) by mouth daily with breakfast. 30 tablet 2  . furosemide (LASIX) 40 MG tablet Take 1 tablet (40 mg total) by mouth as needed. (Patient taking differently: Take 40 mg by mouth daily as needed for fluid. ) 30 tablet   . nitroGLYCERIN (NITROSTAT) 0.4 MG SL tablet Place 1 tablet (0.4 mg total) under the tongue every 5 (five) minutes as needed for chest pain. 60 tablet 0  . potassium chloride SA (K-DUR,KLOR-CON) 20 MEQ tablet Take 40 mEq by mouth daily as needed (when taking furosemide).     . sacubitril-valsartan (ENTRESTO) 49-51 MG Take 1 tablet by mouth 2 (two) times daily. 60 tablet 11  . spironolactone (ALDACTONE) 25 MG tablet Take 1 tablet (25 mg total) by mouth daily. 30 tablet 2   No current facility-administered medications for this encounter.    Vitals:   02/25/19 1359  BP: 104/78  Pulse: 74  SpO2: 98%  Weight: 133.9 kg (295 lb 3.2 oz)   Wt Readings from Last 3 Encounters:  02/25/19 133.9 kg (295 lb 3.2 oz)  02/18/19 130.4 kg (287 lb 7.7 oz)  12/31/18 132 kg (291 lb)    PHYSICAL EXAM: General: Well appearing. No resp difficulty. HEENT: Normal Neck: Supple. JVP 5-6. Carotids 2+ bilat; no bruits. No thyromegaly or nodule noted. Cor: PMI nondisplaced. RRR, No M/G/R noted. Left chest SQ side with bruising, no drainage, steristrips in place.  Lungs: CTAB, normal effort. Abdomen: Obese. Soft, non-tender, non-distended, no  HSM. No bruits or masses. +BS  Extremities: No cyanosis, clubbing, or rash. R and LLE no edema.  Neuro: Alert & orientedx3, cranial nerves grossly intact. moves all 4 extremities w/o difficulty. Affect pleasant   ASSESSMENT & PLAN: 1. 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  - Echo EF 4/19 30-35%.  - Echo 8/19 EF 25-30%  - S/p SQ [employer] ICD 12/18/19 - NYHA II symptoms -  Volume status stable. - Continue lasix prn. Takes rarely. - Continue Entresto 49/51 mg BID. No BP room to increase. - Continue Carvedilol 6.25 mg BID - Continue spironolactone 25 mg daily - Reinforced fluid restriction to < 2 L daily, sodium restriction to less than 2000 mg daily, and the importance of daily weights.   - Now has medicaid.   2. CAD- out of hospital anterior MI with late presentation - cath 1/20 with total occlusion of LAD with R to L collaterals.  - 1/22 S/P DES to proximal/mid LAD. - No s/s ischemia.  - Continue high dose statin, aspirin, and plavix. - Continue Lipitor 80 mg daily. CK negative 11/18/18.  - Cannot afford cardiac rehab. Encouraged continued exercise.   3. ETOH - Has occasional ETOH (rare glass of wine or a beer no Football Sunday) No change.  4. Former Tobacco Abuse - Congratulated on continued abstinence.  - No change.   Alford Highland, NP  2:08 PM   Patient seen and examined with the above-signed Advanced Practice Provider and/or Housestaff. I personally reviewed laboratory data, imaging studies and relevant notes. I independently examined the patient and formulated the important aspects of the plan. I have edited the note to reflect any of my changes or salient points. I have personally discussed the plan with the patient and/or family.  Overall doing fairly well. NYHA II. Volume status ok  Status post recent SQ ICD. On good meds. BP too soft to titrate further. CAD is stable. No change for now.   Arvilla Meres, MD  3:14  PM

## 2019-02-26 ENCOUNTER — Telehealth: Payer: Self-pay | Admitting: Internal Medicine

## 2019-02-26 MED FILL — CARVEDILOL 6.25 MG TABLET: 6.25 | 30 days supply | Qty: 60 | Fill #1 | Status: TO

## 2019-02-26 NOTE — Telephone Encounter (Signed)
Pt woke up this morning and felt a bump near his armpit on left side of the body. Worried that things have shifted. It just feels different than it did yesterday. There is some soreness from the wound, but it has not been as sore as the past few days.

## 2019-02-26 NOTE — Telephone Encounter (Signed)
Spoke with pt, from pts description it sound like pt is describing the actual device it self informed pt that it is normal for the device to move around some under the skin. Pt aware of wound check on 3/3 at 9:30.

## 2019-03-02 ENCOUNTER — Ambulatory Visit (INDEPENDENT_AMBULATORY_CARE_PROVIDER_SITE_OTHER): Payer: Medicaid Other | Admitting: Nurse Practitioner

## 2019-03-02 DIAGNOSIS — I255 Ischemic cardiomyopathy: Secondary | ICD-10-CM

## 2019-03-02 LAB — CUP PACEART INCLINIC DEVICE CHECK
Date Time Interrogation Session: 20200303101044
Implantable Lead Implant Date: 20200219
Implantable Lead Model: 3501
Implantable Lead Serial Number: 168743
Implantable Pulse Generator Implant Date: 20200219
MDC IDC LEAD LOCATION: 753862
Pulse Gen Serial Number: 257847

## 2019-03-02 NOTE — Progress Notes (Signed)
Wound check appointment. Steri-strips removed. Wound without redness or edema. Incision edges approximated, wound well healed except for small area at device incision. Dr Ladona Ridgel examined as well. covered with dry guaze. Pt advised to shower twice daily starting tomorrow.  Normal device function.  No ventricular arrhythmias noted. Patient educated about wound care, arm mobility, lifting restrictions, shock plan. ROV in 3 months with implanting physician.

## 2019-03-03 ENCOUNTER — Telehealth (HOSPITAL_COMMUNITY): Payer: Self-pay

## 2019-03-03 NOTE — Telephone Encounter (Signed)
PA form faxed to [place] [employer] for [employer]

## 2019-03-04 MED FILL — CLOPIDOGREL 75 MG TABLET: 75 | 30 days supply | Qty: 30 | Fill #6 | Status: TO

## 2019-03-08 NOTE — Telephone Encounter (Signed)
Called [employer] to follow up on PA status.  Prior authorization was APPROVED for Sergio Hicks 49/51mg  and will expire on 02/26/2020.  Approval #[id number]   Call reference # [id number]

## 2019-03-15 MED FILL — SPIRONOLACTONE 25 MG TABLET: 25 | 30 days supply | Qty: 30 | Fill #1 | Status: TO

## 2019-03-15 MED FILL — ATORVASTATIN 80 MG TABLET: 80 | 30 days supply | Qty: 30 | Fill #6 | Status: TO

## 2019-03-26 MED FILL — ENTRESTO 49 MG-51 MG TABLET: 49-51 | 30 days supply | Qty: 60 | Fill #0

## 2019-03-26 MED FILL — CARVEDILOL 6.25 MG TABLET: 6.25 | 30 days supply | Qty: 60 | Fill #0

## 2019-03-30 MED FILL — CLOPIDOGREL 75 MG TABLET: 75 | 30 days supply | Qty: 30 | Fill #0

## 2019-04-13 MED FILL — ATORVASTATIN 80 MG TABLET: 80 | 30 days supply | Qty: 30 | Fill #0

## 2019-04-13 MED FILL — SPIRONOLACTONE 25 MG TABS: 25 | 30 days supply | Qty: 30 | Fill #0

## 2019-04-16 ENCOUNTER — Encounter (HOSPITAL_COMMUNITY): Payer: Self-pay

## 2019-05-05 ENCOUNTER — Other Ambulatory Visit (HOSPITAL_COMMUNITY): Payer: Self-pay | Admitting: Internal Medicine

## 2019-05-05 MED FILL — CLOPIDOGREL 75 MG TABLET: 75 | 30 days supply | Qty: 30 | Fill #1

## 2019-05-06 MED FILL — CARVEDILOL 6.25 MG TABLET: 6.25 | 90 days supply | Qty: 180 | Fill #0

## 2019-05-18 MED FILL — SPIRONOLACTONE 25 MG TABS: 25 | 30 days supply | Qty: 30 | Fill #1

## 2019-05-22 ENCOUNTER — Encounter (HOSPITAL_COMMUNITY): Payer: Self-pay

## 2019-05-25 ENCOUNTER — Encounter: Payer: Medicaid Other | Admitting: Internal Medicine

## 2019-05-25 MED FILL — ENTRESTO 49 MG-51 MG TABLET: 49-51 | 30 days supply | Qty: 60 | Fill #1

## 2019-05-25 MED FILL — ATORVASTATIN 80 MG TABLET: 80 | 30 days supply | Qty: 30 | Fill #1

## 2019-05-28 ENCOUNTER — Encounter: Payer: Self-pay | Admitting: Nurse Practitioner

## 2019-05-28 ENCOUNTER — Ambulatory Visit: Payer: Medicaid Other | Admitting: Nurse Practitioner

## 2019-05-28 ENCOUNTER — Other Ambulatory Visit: Payer: Self-pay

## 2019-05-28 VITALS — BP 92/70 | HR 73 | Ht 76.0 in | Wt 289.2 lb

## 2019-05-28 DIAGNOSIS — I255 Ischemic cardiomyopathy: Secondary | ICD-10-CM

## 2019-05-28 DIAGNOSIS — I5022 Chronic systolic (congestive) heart failure: Secondary | ICD-10-CM

## 2019-05-28 LAB — CUP PACEART INCLINIC DEVICE CHECK
Battery Remaining Percentage: 98 %
Date Time Interrogation Session: 20200529133851
Implantable Lead Implant Date: 20200219
Implantable Lead Location: 753862
Implantable Lead Model: 3501
Implantable Lead Serial Number: 168743
Implantable Pulse Generator Implant Date: 20200219
Pulse Gen Serial Number: 257847

## 2019-05-28 NOTE — Progress Notes (Signed)
Electrophysiology Office Note Date: 05/28/2019  ID:  Sergio Hicks, DOB August 11, 1981, MRN [id number]  PCP: Hoy RegisterNewlin, Enobong, MD Primary Cardiologist: Dr. Gala RomneyBensimhon  Electrophysiologist: Dr. Ladona Ridgelaylor  CC: Routine ICD follow-up  Sergio Hicks is a 38 y.o. male seen today for Dr Ladona Ridgelaylor. He presents for routine electrophysiology followup.  Since last being seen in our clinic, the patient reports doing well. He has occasional non-specific abdominal and limb pain. He has no specific aggravating or relieving factors. Still has some numbness around his S-ICD site. He has been less active under COVID restrictions. Taking lasix as needed. He denies SOB, PND, orthopnea, CP, palpitations, nausea, vomiting, dizziness, syncope, edema, weight gain, or early satiety. He has not had ICD shocks.   Device History: [employer] S-ICD implanted 02/17/2019 for ICM History of appropriate therapy: No   Past Medical History:  Diagnosis Date  . Asthma   . CHF (congestive heart failure) (HCC) 12/2016  . Coronary artery disease   . Myocardial infarction [facility](HCC)    Past Surgical History:  Procedure Laterality Date  . CORONARY STENT INTERVENTION N/A 01/20/2018   Procedure: CORONARY STENT INTERVENTION;  Surgeon: SwazilandJordan, Peter M, MD;  [facility];  Service: Cardiovascular;  Laterality: N/A;  . LEFT HEART CATH AND CORONARY ANGIOGRAPHY N/A 01/18/2018   Procedure: LEFT HEART CATH AND CORONARY ANGIOGRAPHY;  Surgeon: Runell GessBerry, Jonathan J, MD;  Location: [facility] INVASIVE CV LAB;  Service: Cardiovascular;  Laterality: N/A;  . SUBQ ICD IMPLANT  02/17/2019  . SUBQ ICD IMPLANT N/A 02/17/2019   Procedure: SUBQ ICD IMPLANT;  Surgeon: Marinus Mawaylor, Gregg W, MD;  [facility];  Service: Cardiovascular;  Laterality: N/A;    Current Outpatient Medications  Medication Sig Dispense Refill  . albuterol (PROVENTIL HFA;VENTOLIN HFA) 108 (90 Base) MCG/ACT inhaler Inhale 1-2 puffs into the lungs every 6 (six) hours as needed  for wheezing or shortness of breath.    Marland Kitchen. aspirin EC 81 MG EC tablet Take 1 tablet (81 mg total) by mouth daily. 30 tablet 6  . atorvastatin (LIPITOR) 80 MG tablet TAKE 1 TABLET BY MOUTH DAILY AT 6 PM. 30 tablet 2  . carvedilol (COREG) 6.25 MG tablet TAKE 1 TABLET BY MOUTH 2 TIMES DAILY. 180 tablet 0  . cholecalciferol (VITAMIN D3) 25 MCG (1000 UT) tablet Take 1,000 Units by mouth daily.    . clopidogrel (PLAVIX) 75 MG tablet Take 1 tablet (75 mg total) by mouth daily with breakfast. 30 tablet 2  . furosemide (LASIX) 40 MG tablet Take 1 tablet (40 mg total) by mouth as needed. 30 tablet   . nitroGLYCERIN (NITROSTAT) 0.4 MG SL tablet Place 1 tablet (0.4 mg total) under the tongue every 5 (five) minutes as needed for chest pain. 60 tablet 0  . potassium chloride SA (K-DUR,KLOR-CON) 20 MEQ tablet Take 40 mEq by mouth daily as needed (when taking furosemide).     . sacubitril-valsartan (ENTRESTO) 49-51 MG Take 1 tablet by mouth 2 (two) times daily. 60 tablet 11  . spironolactone (ALDACTONE) 25 MG tablet Take 1 tablet (25 mg total) by mouth daily. 30 tablet 2   No current facility-administered medications for this visit.     Allergies:   Patient has no known allergies.   Social History: Social History   Socioeconomic History  . Marital status: Single    Spouse name: Not on file  . Number of children: Not on file  . Years of education: Not on file  . Highest education  level: Not on file  Occupational History  . Occupation: Investment banker, operational  Social Needs  . Financial resource strain: Not on file  . Food insecurity:    Worry: Not on file    Inability: Not on file  . Transportation needs:    Medical: Not on file    Non-medical: Not on file  Tobacco Use  . Smoking status: Former Smoker    Packs/day: 0.00    Types: Cigarettes  . Smokeless tobacco: Never Used  . Tobacco comment: 2019  Substance and Sexual Activity  . Alcohol use: Yes    Alcohol/week: 1.0 standard drinks    Types: 1 Glasses of  wine per week    Comment: 1-2 days a week   . Drug use: Never  . Sexual activity: Not on file  Lifestyle  . Physical activity:    Days per week: Not on file    Minutes per session: Not on file  . Stress: Not on file  Relationships  . Social connections:    Talks on phone: Not on file    Gets together: Not on file    Attends religious service: Not on file    Active member of club or organization: Not on file    Attends meetings of clubs or organizations: Not on file    Relationship status: Not on file  . Intimate partner violence:    Fear of current or ex partner: Not on file    Emotionally abused: Not on file    Physically abused: Not on file    Forced sexual activity: Not on file  Other Topics Concern  . Not on file  Social History Narrative  . Not on file    Family History: Family History  Adopted: Yes    Review of Systems: All other systems reviewed and are otherwise negative except as noted above.   Physical Exam: VS:  BP 92/70   Pulse 73   Ht 6\' 4"  (1.93 m)   Wt 289 lb 3.2 oz (131.2 kg)   SpO2 96%   BMI 35.20 kg/m  , BMI Body mass index is 35.2 kg/m.  GEN- The patient is well appearing, alert and oriented x 3 today.   HEENT: normocephalic, atraumatic; sclera clear, conjunctiva pink; hearing intact; oropharynx clear; neck supple, no JVP Lymph- no cervical lymphadenopathy Lungs- Clear to ausculation bilaterally, normal work of breathing.  No wheezes, rales, rhonchi Heart- Regular rate and rhythm, no murmurs, rubs or gallops, PMI not laterally displaced GI- soft, non-tender, non-distended, bowel sounds present, no hepatosplenomegaly Extremities- no clubbing, cyanosis, or edema; DP/PT/radial pulses 2+ bilaterally MS- no significant deformity or atrophy Skin- warm and dry, no rash or lesion; ICD pocket well healed Psych- euthymic mood, full affect Neuro- strength and sensation are intact  ICD interrogation- reviewed in detail today. He has S-ICD. Impedence  OK. Shock zone 250 bpm, Condition shock zone 220 bpm.   EKG:  EKG is not ordered today.  Recent Labs: 11/18/2018: ALT 24 12/13/2018: B Natriuretic Peptide 134.8 02/11/2019: BUN 13; Creatinine, Ser 0.95; Hemoglobin 14.5; Platelets 276; Potassium 4.5; Sodium 136   Wt Readings from Last 3 Encounters:  05/28/19 289 lb 3.2 oz (131.2 kg)  02/25/19 295 lb 3.2 oz (133.9 kg)  02/18/19 287 lb 7.7 oz (130.4 kg)     Other studies Reviewed: Additional studies/ records that were reviewed today include: previous labwork and [facility] reports.   Assessment and Plan:  1.  Chronic systolic dysfunction euvolemic today. Continue lasix as needed.  Stable on an appropriate medical regimen. BP has prevented up-titration.  Normal S-ICD function No changes made today  2. CAD Denies exertional chest pain. On good medical regimen for his CHF.   3. Non-specific pain Including abdominal and limb pain. This does not sound like low output CHF or cardiac in etiology. Recommended follow up with his PCP.    Current medicines are reviewed at length with the patient today.   The patient does not have concerns regarding his medicines.  The following changes were made today:  none  Labs/ tests ordered today include:  Orders Placed This Encounter  Procedures  . CUP PACEART INCLINIC DEVICE CHECK   Disposition:   Follow up annually with Dr. Ladona Ridgel as he is followed closely in the HF clinic. Continue remote follow up q 3 months.   Signed,  Doreatha Martin, PA-C 05/28/2019 3:13 PM  05/28/2019 3:06 PM  [facility] [address] [place] [address] [phone] (office) [phone] (fax)

## 2019-05-28 NOTE — Patient Instructions (Signed)
Medication Instructions:  none If you need a refill on your cardiac medications before your next appointment, please call your pharmacy.   Lab work: none If you have labs (blood work) drawn today and your tests are completely normal, you will receive your results only by: . MyChart Message (if you have MyChart) OR . A paper copy in the mail If you have any lab test that is abnormal or we need to change your treatment, we will call you to review the results.  Testing/Procedures: none  Follow-Up: At [facility], you and your health needs are our priority.  As part of our continuing mission to provide you with exceptional heart care, we have created designated Provider Care Teams.  These Care Teams include your primary Cardiologist (physician) and Advanced Practice Providers (APPs -  Physician Assistants and Nurse Practitioners) who all work together to provide you with the care you need, when you need it. You will need a follow up appointment in 1 years.  Please call our office 2 months in advance to schedule this appointment.  You may see Dr Taylor or one of the following Advanced Practice Providers on your designated Care Team:   Amber Seiler, NP . Renee Ursuy, PA-C  Any Other Special Instructions Will Be Listed Below (If Applicable).    

## 2019-06-09 ENCOUNTER — Other Ambulatory Visit (HOSPITAL_COMMUNITY): Payer: Self-pay | Admitting: Internal Medicine

## 2019-06-09 MED FILL — SPIRONOLACTONE 25 MG TABS: 25 | 30 days supply | Qty: 30 | Fill #0

## 2019-06-09 MED FILL — CLOPIDOGREL 75 MG TABLET: 75 | 30 days supply | Qty: 30 | Fill #2

## 2019-06-16 ENCOUNTER — Telehealth (HOSPITAL_COMMUNITY): Payer: Self-pay | Admitting: Vascular Surgery

## 2019-06-16 NOTE — Telephone Encounter (Signed)
Left pt message to inform that  6/26 appt w/ db , will be moved 7/2 @ 1:40 PM

## 2019-06-21 ENCOUNTER — Ambulatory Visit (INDEPENDENT_AMBULATORY_CARE_PROVIDER_SITE_OTHER): Payer: Medicaid Other | Admitting: *Deleted

## 2019-06-21 DIAGNOSIS — I5022 Chronic systolic (congestive) heart failure: Secondary | ICD-10-CM

## 2019-06-21 DIAGNOSIS — I255 Ischemic cardiomyopathy: Secondary | ICD-10-CM

## 2019-06-22 LAB — CUP PACEART REMOTE DEVICE CHECK
Battery Remaining Percentage: 96 %
Date Time Interrogation Session: 20200622155200
Implantable Lead Implant Date: 20200219
Implantable Lead Location: 753862
Implantable Lead Model: 3501
Implantable Lead Serial Number: 168743
Implantable Pulse Generator Implant Date: 20200219
Pulse Gen Serial Number: 257847

## 2019-06-23 ENCOUNTER — Other Ambulatory Visit (HOSPITAL_COMMUNITY): Payer: Self-pay | Admitting: Internal Medicine

## 2019-06-23 MED FILL — ATORVASTATIN 80 MG TABLET: 80 | 30 days supply | Qty: 30 | Fill #2

## 2019-06-23 MED FILL — ENTRESTO 49 MG-51 MG TABLET: 49-51 | 30 days supply | Qty: 60 | Fill #2

## 2019-06-25 ENCOUNTER — Encounter (HOSPITAL_COMMUNITY): Payer: Medicaid Other | Admitting: Internal Medicine

## 2019-06-28 NOTE — Progress Notes (Signed)
Remote ICD transmission.   

## 2019-07-01 ENCOUNTER — Encounter (HOSPITAL_COMMUNITY): Payer: Self-pay | Admitting: Internal Medicine

## 2019-07-01 ENCOUNTER — Other Ambulatory Visit: Payer: Self-pay

## 2019-07-01 ENCOUNTER — Ambulatory Visit (HOSPITAL_COMMUNITY)
Admission: RE | Admit: 2019-07-01 | Discharge: 2019-07-01 | Disposition: A | Discharge status: Home / Self Care | Payer: Medicaid Other | Source: Ambulatory Visit | Attending: Internal Medicine | Admitting: Internal Medicine

## 2019-07-01 VITALS — BP 101/79 | HR 78 | Wt 290.4 lb

## 2019-07-01 DIAGNOSIS — Z87891 Personal history of nicotine dependence: Secondary | ICD-10-CM | POA: Insufficient documentation

## 2019-07-01 DIAGNOSIS — Z79899 Other long term (current) drug therapy: Secondary | ICD-10-CM | POA: Diagnosis not present

## 2019-07-01 DIAGNOSIS — I2510 Atherosclerotic heart disease of native coronary artery without angina pectoris: Secondary | ICD-10-CM | POA: Diagnosis not present

## 2019-07-01 DIAGNOSIS — Z7982 Long term (current) use of aspirin: Secondary | ICD-10-CM | POA: Diagnosis not present

## 2019-07-01 DIAGNOSIS — J45909 Unspecified asthma, uncomplicated: Secondary | ICD-10-CM | POA: Diagnosis not present

## 2019-07-01 DIAGNOSIS — Z7902 Long term (current) use of antithrombotics/antiplatelets: Secondary | ICD-10-CM | POA: Insufficient documentation

## 2019-07-01 DIAGNOSIS — I5022 Chronic systolic (congestive) heart failure: Secondary | ICD-10-CM

## 2019-07-01 DIAGNOSIS — Z955 Presence of coronary angioplasty implant and graft: Secondary | ICD-10-CM | POA: Diagnosis not present

## 2019-07-01 DIAGNOSIS — I252 Old myocardial infarction: Secondary | ICD-10-CM | POA: Diagnosis not present

## 2019-07-01 LAB — BASIC METABOLIC PANEL
Anion gap: 10 (ref 5–15)
BUN: 12 mg/dL (ref 6–20)
CO2: 23 mmol/L (ref 22–32)
Calcium: 9.3 mg/dL (ref 8.9–10.3)
Chloride: 104 mmol/L (ref 98–111)
Creatinine, Ser: 0.95 mg/dL (ref 0.61–1.24)
GFR calc Af Amer: >60 mL/min (ref >60)
GFR calc non Af Amer: >60 mL/min (ref >60)
Glucose, Bld: 95 mg/dL (ref 70–99)
Potassium: 3.7 mmol/L (ref 3.5–5.1)
Sodium: 137 mmol/L (ref 135–145)

## 2019-07-01 NOTE — Addendum Note (Signed)
Encounter addended by: Scarlette Calico, RN on: 07/01/2019 2:25 PM  Actions taken: Order list changed, Diagnosis association updated, Clinical Note Signed

## 2019-07-01 NOTE — Progress Notes (Signed)
PCP: Primary HF Cardiologist: Dr Bensimhon   Advanced HF Clinic Note   HPI: Sergio Hicks is a 38 y.o. 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   Admitted 01/18/2018 with OOH anterior MI and severe ICM. Taken urgently for cath which showed 100% LAD occlusion as below. ECHO1/20/19 LVEF 30%. Initially treated medically but developed persistent CP so taken for PCI and pt is s/p DES to proximal/mid LAD 01/20/18.  Seen in ED 12/13/18 for Chest pain and "swelling in his chest area". He had awakened at midnight to urinate, and had lightheadedness and chest pain. Mildly improved with NTG and Lasix which helped, but recurred in the am and persisted through the day prompting his visit. Troponin negative x 2. D-Dimer negative. WBC negative. He had a lot of anxiety, and was worried about his kidneys.   S/p SQ [employer] ICD with Dr Ladona Ridgelaylor on 02/17/19.  He returns today for regular follow up. Overall doing well. Not going to the gym because it is closed. Walking for 30 mins two times per day. Denies SOB, orthopnea or PND. Taking all meds. No dizziness. Not needing and lasix. ICD site has healed well. Has had some non-specific ab pain that haas improved.    Echo 07/2018: EF 25-30% Echo 4/19 EF 30-35%.   ECHO 01/18/2018 LVEF 30-35% with large LAD territory WMA concerning for   ischemia/infarct, grade 1 DD with elevated LV filling pressure,   trivial MR, mild LAE, elevated RA pressure.  Review of systems complete and found to be negative unless listed in HPI.   SH:  Social History   Socioeconomic History  . Marital status: Single    Spouse name: Not on file  . Number of children: Not on file  . Years of education: Not on file  . Highest education level: Not on file  Occupational History  . Occupation: Investment banker, operationalchef  Social Needs  . Financial resource strain: Not on file  . Food  insecurity    Worry: Not on file    Inability: Not on file  . Transportation needs    Medical: Not on file    Non-medical: Not on file  Tobacco Use  . Smoking status: Former Smoker    Packs/day: 0.00    Types: Cigarettes  . Smokeless tobacco: Never Used  . Tobacco comment: 2019  Substance and Sexual Activity  . Alcohol use: Yes    Alcohol/week: 1.0 standard drinks    Types: 1 Glasses of wine per week    Comment: 1-2 days a week   . Drug use: Never  . Sexual activity: Not on file  Lifestyle  . Physical activity    Days per week: Not on file    Minutes per session: Not on file  . Stress: Not on file  Relationships  . Social Musicianconnections    Talks on phone: Not on file    Gets together: Not on file    Attends religious service: Not on file    Active member of club or organization: Not on file    Attends meetings of clubs or organizations: Not on file    Relationship status: Not on file  . Intimate partner violence    Fear of current or ex partner: Not on file    Emotionally abused: Not on file    Physically abused: Not on file    Forced sexual  activity: Not on file  Other Topics Concern  . Not on file  Social History Narrative  . Not on file   FH:  Family History  Adopted: Yes   Past Medical History:  Diagnosis Date  . Asthma   . CHF (congestive heart failure) (HCC) 12/2016  . Coronary artery disease   . Myocardial infarction [facility])    Current Outpatient Medications  Medication Sig Dispense Refill  . albuterol (PROVENTIL HFA;VENTOLIN HFA) 108 (90 Base) MCG/ACT inhaler Inhale 1-2 puffs into the lungs every 6 (six) hours as needed for wheezing or shortness of breath.    Marland Kitchen aspirin EC 81 MG EC tablet Take 1 tablet (81 mg total) by mouth daily. 30 tablet 6  . atorvastatin (LIPITOR) 80 MG tablet TAKE 1 TABLET BY MOUTH DAILY AT 6 PM. 30 tablet 2  . carvedilol (COREG) 6.25 MG tablet TAKE 1 TABLET BY MOUTH 2 TIMES DAILY. 180 tablet 0  . cholecalciferol (VITAMIN D3) 25 MCG  (1000 UT) tablet Take 1,000 Units by mouth daily.    . clopidogrel (PLAVIX) 75 MG tablet Take 1 tablet (75 mg total) by mouth daily with breakfast. 30 tablet 2  . nitroGLYCERIN (NITROSTAT) 0.4 MG SL tablet Place 1 tablet (0.4 mg total) under the tongue every 5 (five) minutes as needed for chest pain. 60 tablet 0  . sacubitril-valsartan (ENTRESTO) 49-51 MG Take 1 tablet by mouth 2 (two) times daily. 60 tablet 11  . spironolactone (ALDACTONE) 25 MG tablet TAKE 1 TABLET (25 MG TOTAL) BY MOUTH DAILY. 30 tablet 1  . furosemide (LASIX) 40 MG tablet Take 1 tablet (40 mg total) by mouth as needed. (Patient not taking: Reported on 07/01/2019) 30 tablet   . potassium chloride SA (K-DUR,KLOR-CON) 20 MEQ tablet Take 40 mEq by mouth daily as needed (when taking furosemide).      No current facility-administered medications for this encounter.    Vitals:   07/01/19 1353  BP: 101/79  Pulse: 78  SpO2: 98%  Weight: 131.7 kg (290 lb 6.4 oz)   Wt Readings from Last 3 Encounters:  07/01/19 131.7 kg (290 lb 6.4 oz)  05/28/19 131.2 kg (289 lb 3.2 oz)  02/25/19 133.9 kg (295 lb 3.2 oz)    PHYSICAL EXAM: General:  Well appearing. No resp difficulty HEENT: normal Neck: supple. no JVD. Carotids 2+ bilat; no bruits. No lymphadenopathy or thryomegaly appreciated. Cor: PMI nondisplaced. Regular rate & rhythm. No rubs, gallops or murmurs. Lungs: clear Abdomen: obese, soft, nontender, nondistended. No hepatosplenomegaly. No bruits or masses. Good bowel sounds. Extremities: no cyanosis, clubbing, rash, edema Neuro: alert & orientedx3, cranial nerves grossly intact. moves all 4 extremities w/o difficulty. Affect pleasant   ASSESSMENT & PLAN: 1. 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  - Echo EF 4/19 30-35%.  - Echo 8/19 EF 25-30%  - S/p SQ [employer] ICD 12/18/19 - Stable NYHA II symptoms - Volume status stable. - Continue lasix prn. Doesn't need it.  - Continue Entresto  49/51 mg BID. No BP room to increase. - Continue Carvedilol 6.25 mg BID - Continue spironolactone 25 mg daily - Reinforced fluid restriction to < 2 L daily, sodium restriction to less than 2000 mg daily, and the importance of daily weights.   - Now has medicaid.   2. CAD- out of hospital anterior MI with late presentation - cath 1/20 with total occlusion of LAD with R to L collaterals.  - 1/22 S/P DES to proximal/mid LAD. -  No s/s ischemia  - Continue high dose statin, aspirin, and plavix. - Continue Lipitor 80 mg daily. CK negative 11/18/18.   3. ETOH - Has occasional ETOH (rare glass of wine or a beer no Football Sunday) No change.  Glori Bickers, MD  2:06 PM

## 2019-07-01 NOTE — Addendum Note (Signed)
Encounter addended by: Scarlette Calico, RN on: 07/01/2019 2:26 PM  Actions taken: Charge Capture section accepted

## 2019-07-01 NOTE — Progress Notes (Signed)
Medication Samples have been provided to the patient.  Drug name: Delene Loll       Strength: 24/26mg         Qty: 1  LOT: [id number]  Exp.Date: 7/22  Dosing instructions: Take 2 tab Twice daily   The patient has been instructed regarding the correct time, dose, and frequency of taking this medication, including desired effects and most common side effects.   Karesha Trzcinski 2:23 PM 07/01/2019

## 2019-07-01 NOTE — Patient Instructions (Signed)
Labs done today  We will contact you in 4 months to schedule your next appointment and echocardiogram

## 2019-07-12 MED FILL — ENTRESTO 49 MG-51 MG TABLET: 49-51 | 30 days supply | Qty: 60 | Fill #2

## 2019-07-12 MED FILL — CLOPIDOGREL 75 MG TABLET: 75 | 30 days supply | Qty: 30 | Fill #3

## 2019-07-12 MED FILL — SPIRONOLACTONE 25 MG TABS: 25 | 30 days supply | Qty: 30 | Fill #1

## 2019-08-05 ENCOUNTER — Other Ambulatory Visit (HOSPITAL_COMMUNITY): Payer: Self-pay | Admitting: Student

## 2019-08-05 MED FILL — ATORVASTATIN 80 MG TABLET: 80 | 30 days supply | Qty: 30 | Fill #0

## 2019-08-11 ENCOUNTER — Other Ambulatory Visit (HOSPITAL_COMMUNITY): Payer: Self-pay | Admitting: Student

## 2019-08-11 MED FILL — CARVEDILOL 6.25 MG TABLET: 6.25 | 90 days supply | Qty: 180 | Fill #0

## 2019-08-11 MED FILL — CLOPIDOGREL 75 MG TABLET: 75 | 30 days supply | Qty: 30 | Fill #0

## 2019-08-11 MED FILL — ENTRESTO 49 MG-51 MG TABLET: 49-51 | 30 days supply | Qty: 60 | Fill #3

## 2019-08-19 ENCOUNTER — Other Ambulatory Visit: Payer: Self-pay

## 2019-08-19 DIAGNOSIS — Z20822 Contact with and (suspected) exposure to covid-19: Secondary | ICD-10-CM

## 2019-08-20 LAB — NOVEL CORONAVIRUS, NAA: SARS-CoV-2, NAA: NOT DETECTED

## 2019-08-20 IMAGING — DX DG CHEST 2V
2 series · 2 of 2 positions shown · non-contrast
Comparison: 01/18/2018

CLINICAL DATA: Chest pain

EXAM:
CHEST - 2 VIEW

[chest pa]
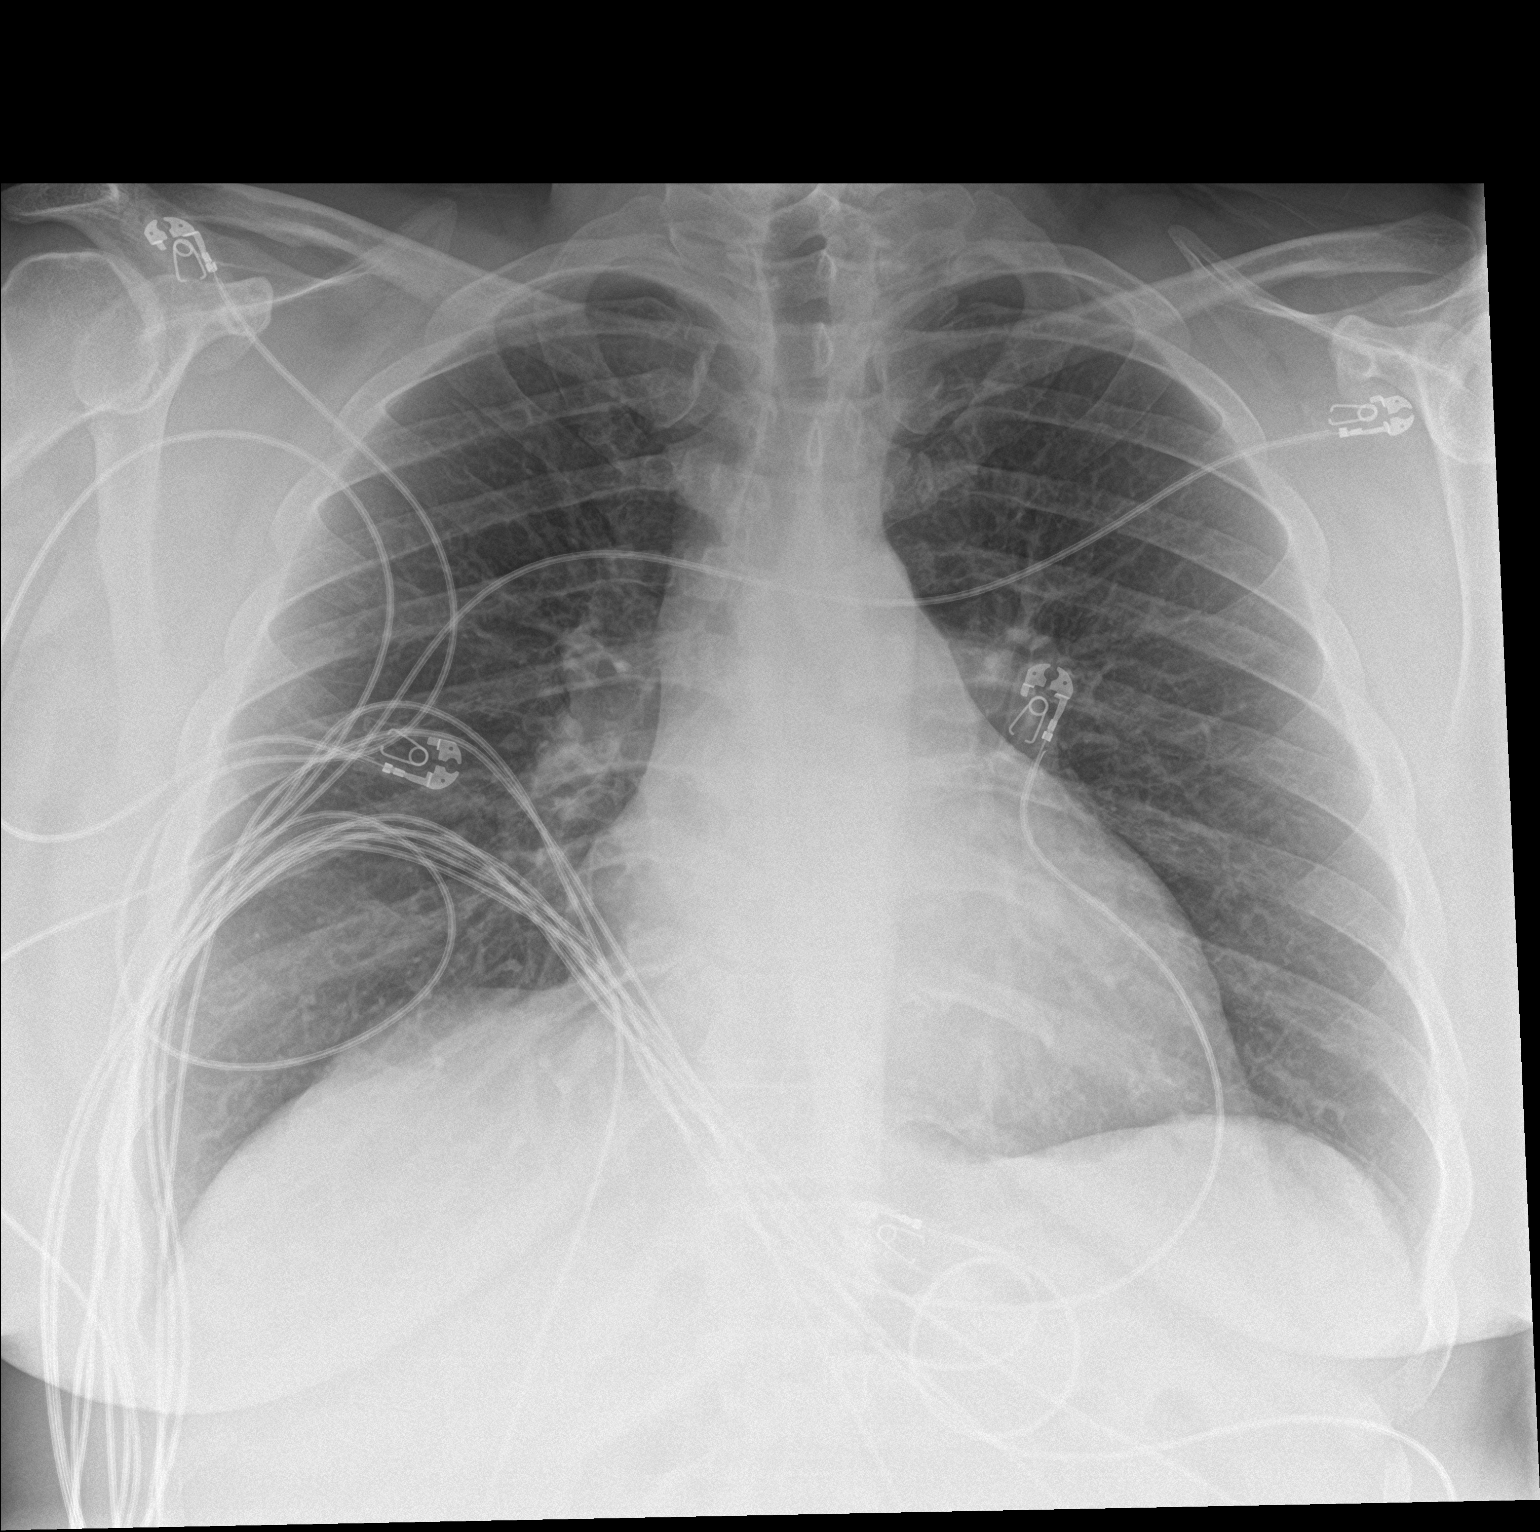

[chest lat]
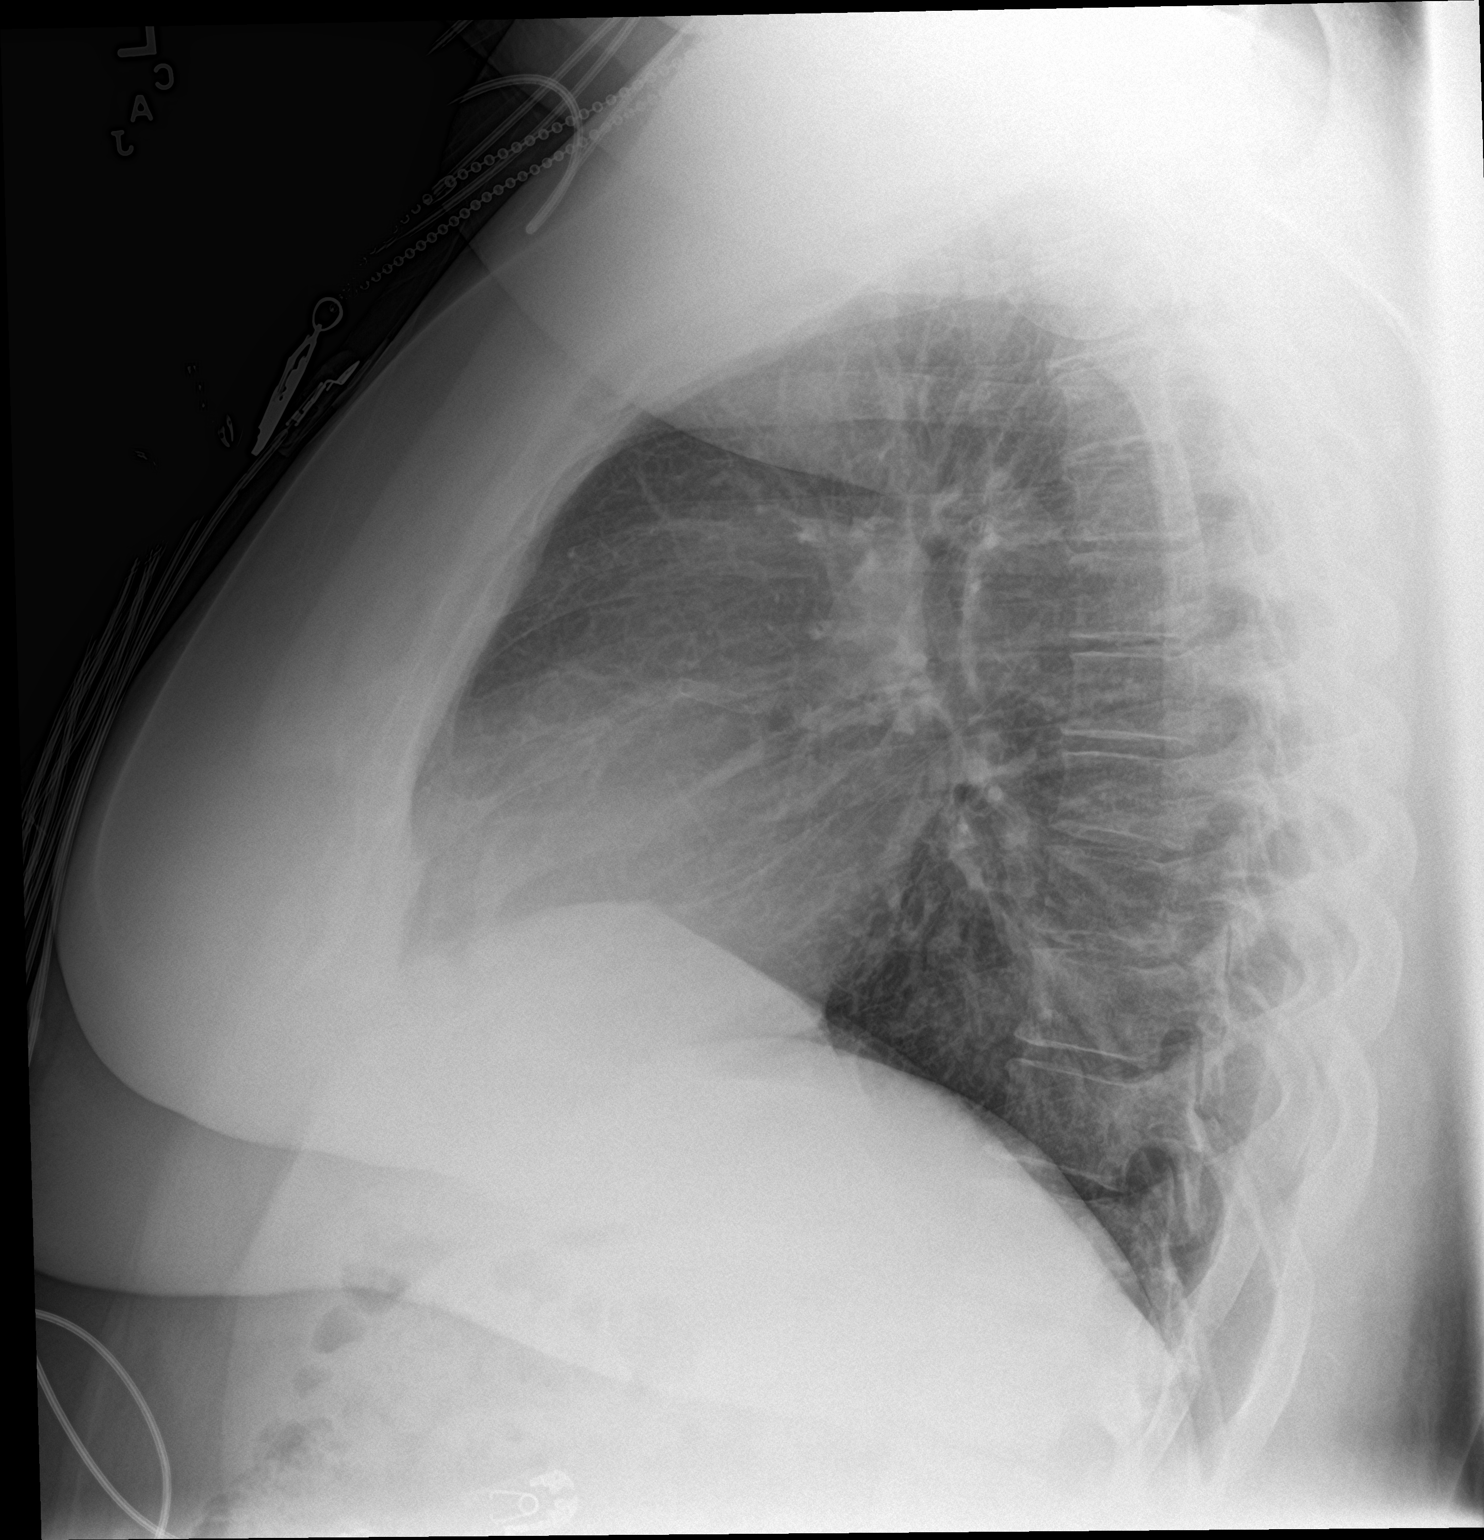

[2 of 2 positions shown; findings below may reference images not displayed]

FINDINGS: Heart and mediastinal contours are within normal limits. No focal
opacities or effusions. No acute bony abnormality.
IMPRESSION: No active cardiopulmonary disease.

## 2019-08-23 ENCOUNTER — Other Ambulatory Visit (HOSPITAL_COMMUNITY): Payer: Self-pay | Admitting: Internal Medicine

## 2019-08-23 ENCOUNTER — Other Ambulatory Visit (HOSPITAL_COMMUNITY): Payer: Self-pay

## 2019-08-23 MED ORDER — SPIRONOLACTONE 25 MG PO TABS: 25.0000 mg | ORAL_TABLET | Freq: Every day | ORAL | 3 refills | Status: DC

## 2019-08-23 MED FILL — SPIRONOLACTONE 25 MG TABS: 25 | 90 days supply | Qty: 90 | Fill #0

## 2019-09-08 MED FILL — ATORVASTATIN 80 MG TABLET: 80 | 30 days supply | Qty: 30 | Fill #1

## 2019-09-08 MED FILL — ENTRESTO 49 MG-51 MG TABLET: 49-51 | 30 days supply | Qty: 60 | Fill #4

## 2019-09-08 MED FILL — CLOPIDOGREL 75 MG TABLET: 75 | 30 days supply | Qty: 30 | Fill #1

## 2019-09-21 ENCOUNTER — Ambulatory Visit (INDEPENDENT_AMBULATORY_CARE_PROVIDER_SITE_OTHER): Payer: Medicaid Other | Admitting: *Deleted

## 2019-09-21 DIAGNOSIS — I255 Ischemic cardiomyopathy: Secondary | ICD-10-CM

## 2019-09-21 DIAGNOSIS — I5022 Chronic systolic (congestive) heart failure: Secondary | ICD-10-CM | POA: Diagnosis not present

## 2019-09-22 LAB — CUP PACEART REMOTE DEVICE CHECK
Battery Remaining Percentage: 94 %
Date Time Interrogation Session: 20200923101716
Implantable Lead Implant Date: 20200219
Implantable Lead Location: 753862
Implantable Lead Model: 3501
Implantable Lead Serial Number: 168743
Implantable Pulse Generator Implant Date: 20200219
Pulse Gen Serial Number: 257847

## 2019-09-28 NOTE — Progress Notes (Signed)
Remote ICD transmission.   

## 2019-10-10 MED FILL — ENTRESTO 49 MG-51 MG TABLET: 49-51 | 30 days supply | Qty: 60 | Fill #5

## 2019-10-10 MED FILL — CLOPIDOGREL 75 MG TABLET: 75 | 30 days supply | Qty: 30 | Fill #2

## 2019-10-10 MED FILL — ATORVASTATIN 80 MG TABLET: 80 | 30 days supply | Qty: 30 | Fill #2

## 2019-11-17 ENCOUNTER — Other Ambulatory Visit (HOSPITAL_COMMUNITY): Payer: Self-pay | Admitting: Internal Medicine

## 2019-11-17 MED FILL — ENTRESTO 49 MG-51 MG TABLET: 49-51 | 30 days supply | Qty: 60 | Fill #6

## 2019-11-17 MED FILL — CARVEDILOL 6.25 MG TABLET: 6.25 | 90 days supply | Qty: 180 | Fill #0

## 2019-11-17 MED FILL — CLOPIDOGREL 75 MG TABLET: 75 | 30 days supply | Qty: 30 | Fill #3

## 2019-11-17 MED FILL — SPIRONOLACTONE 25 MG TABS: 25 | 90 days supply | Qty: 90 | Fill #1

## 2019-11-17 MED FILL — ATORVASTATIN 80 MG TABLET: 80 | 30 days supply | Qty: 30 | Fill #3

## 2019-11-19 ENCOUNTER — Telehealth: Payer: Self-pay

## 2019-11-19 NOTE — Telephone Encounter (Signed)
lpmtcb 11/20 

## 2019-12-08 ENCOUNTER — Ambulatory Visit: Payer: Medicaid Other | Admitting: Student

## 2019-12-09 NOTE — Progress Notes (Signed)
Electrophysiology Office Note Date: 12/09/2019  ID:  Sergio Hicks, DOB 08-Jun-1981, MRN [id number]  PCP: Hoy Register, MD Primary Cardiologist: Dr. Gala Romney  Electrophysiologist: Dr. Ladona Ridgel   CC: Routine ICD follow-up  Sergio Hicks is a 38 y.o. male seen today for Dr. Ladona Ridgel.  They present today for routine electrophysiology followup.  Since last being seen in our clinic, the patient reports doing very well. He denies palpitations, PND, orthopnea, nausea, vomiting, syncope, edema, weight gain, or early satiety.  He has not had ICD shocks. Mild lightheadedness on occasion, but not marked or limiting.  The pains he described in and around his chest in November have subsided for the most part. He denies exertional chest pain, and states his pain is the opposite. It typically occurs when he is done with work or with activity, and he is sitting down to rest.  He walks his dog daily for 30-45 without any chest pain or SOB.  Denies significant SOB.   Device History: [employer] 02/17/2019 implanted for Ischemic cardiomyopathy History of appropriate therapy: No History of AAD therapy: No   Past Medical History:  Diagnosis Date  . Asthma   . CHF (congestive heart failure) (HCC) 12/2016  . Coronary artery disease   . Myocardial infarction [facility])    Past Surgical History:  Procedure Laterality Date  . CORONARY STENT INTERVENTION N/A 01/20/2018   Procedure: CORONARY STENT INTERVENTION;  Surgeon: [place], Peter M, MD;  Location: [facility] INVASIVE CV LAB;  Service: Cardiovascular;  Laterality: N/A;  . LEFT HEART CATH AND CORONARY ANGIOGRAPHY N/A 01/18/2018   Procedure: LEFT HEART CATH AND CORONARY ANGIOGRAPHY;  Surgeon: Runell Gess, MD;  Location: [facility] INVASIVE CV LAB;  Service: Cardiovascular;  Laterality: N/A;  . SUBQ ICD IMPLANT  02/17/2019  . SUBQ ICD IMPLANT N/A 02/17/2019   Procedure: SUBQ ICD IMPLANT;  Surgeon: Marinus Maw, MD;  Location: [facility] INVASIVE CV LAB;  Service:  Cardiovascular;  Laterality: N/A;    Current Outpatient Medications  Medication Sig Dispense Refill  . albuterol (PROVENTIL HFA;VENTOLIN HFA) 108 (90 Base) MCG/ACT inhaler Inhale 1-2 puffs into the lungs every 6 (six) hours as needed for wheezing or shortness of breath.    Marland Kitchen aspirin EC 81 MG EC tablet Take 1 tablet (81 mg total) by mouth daily. 30 tablet 6  . atorvastatin (LIPITOR) 80 MG tablet TAKE 1 TABLET BY MOUTH DAILY AT 6 PM. 30 tablet 3  . carvedilol (COREG) 6.25 MG tablet TAKE 1 TABLET BY MOUTH 2 TIMES DAILY. 180 tablet 0  . cholecalciferol (VITAMIN D3) 25 MCG (1000 UT) tablet Take 1,000 Units by mouth daily.    . clopidogrel (PLAVIX) 75 MG tablet TAKE 1 TABLET BY MOUTH DAILY WITH BREAKFAST 30 tablet 3  . furosemide (LASIX) 40 MG tablet Take 1 tablet (40 mg total) by mouth as needed. (Patient not taking: Reported on 07/01/2019) 30 tablet   . nitroGLYCERIN (NITROSTAT) 0.4 MG SL tablet Place 1 tablet (0.4 mg total) under the tongue every 5 (five) minutes as needed for chest pain. 60 tablet 0  . potassium chloride SA (K-DUR,KLOR-CON) 20 MEQ tablet Take 40 mEq by mouth daily as needed (when taking furosemide).     . sacubitril-valsartan (ENTRESTO) 49-51 MG Take 1 tablet by mouth 2 (two) times daily. 60 tablet 11  . spironolactone (ALDACTONE) 25 MG tablet Take 1 tablet (25 mg total) by mouth daily. 90 tablet 3   No current facility-administered medications for this visit.    Allergies:  Patient has no known allergies.   Social History: Social History   Socioeconomic History  . Marital status: Single    Spouse name: Not on file  . Number of children: Not on file  . Years of education: Not on file  . Highest education level: Not on file  Occupational History  . Occupation: chef  Tobacco Use  . Smoking status: Former Smoker    Packs/day: 0.00    Types: Cigarettes  . Smokeless tobacco: Never Used  . Tobacco comment: 2019  Substance and Sexual Activity  . Alcohol use: Yes     Alcohol/week: 1.0 standard drinks    Types: 1 Glasses of wine per week    Comment: 1-2 days a week   . Drug use: Never  . Sexual activity: Not on file  Other Topics Concern  . Not on file  Social History Narrative  . Not on file   Social Determinants of Health   Financial Resource Strain:   . Difficulty of Paying Living Expenses: Not on file  Food Insecurity:   . Worried About Programme researcher, broadcasting/film/video in the Last Year: Not on file  . Ran Out of Food in the Last Year: Not on file  Transportation Needs:   . Lack of Transportation (Medical): Not on file  . Lack of Transportation (Non-Medical): Not on file  Physical Activity:   . Days of Exercise per Week: Not on file  . Minutes of Exercise per Session: Not on file  Stress:   . Feeling of Stress : Not on file  Social Connections:   . Frequency of Communication with Friends and Family: Not on file  . Frequency of Social Gatherings with Friends and Family: Not on file  . Attends Religious Services: Not on file  . Active Member of Clubs or Organizations: Not on file  . Attends Banker Meetings: Not on file  . Marital Status: Not on file  Intimate Partner Violence:   . Fear of Current or Ex-Partner: Not on file  . Emotionally Abused: Not on file  . Physically Abused: Not on file  . Sexually Abused: Not on file    Family History: Family History  Adopted: Yes    Review of Systems: All other systems reviewed and are otherwise negative except as noted above.   Physical Exam: There were no vitals filed for this visit.   GEN- The patient is well appearing, alert and oriented x 3 today.   HEENT: normocephalic, atraumatic; sclera clear, conjunctiva pink; hearing intact; oropharynx clear; neck supple, no JVP Lymph- no cervical lymphadenopathy Lungs- Clear to ausculation bilaterally, normal work of breathing.  No wheezes, rales, rhonchi Heart- Regular rate and rhythm, no murmurs, rubs or gallops, PMI not laterally  displaced GI- soft, non-tender, non-distended, bowel sounds present, no hepatosplenomegaly Extremities- no clubbing, cyanosis, or edema; DP/PT/radial pulses 2+ bilaterally MS- no significant deformity or atrophy Skin- warm and dry, no rash or lesion; ICD pocket well healed Psych- euthymic mood, full affect Neuro- strength and sensation are intact  ICD interrogation- reviewed in detail today,  See PACEART report  EKG:  EKG is ordered today. The ekg ordered today shows NSR at 77 bpm with PR interval of 158 ms and QRS 82 ms.  Recent Labs: 12/13/2018: B Natriuretic Peptide 134.8 02/11/2019: Hemoglobin 14.5; Platelets 276 07/01/2019: BUN 12; Creatinine, Ser 0.95; Potassium 3.7; Sodium 137   Wt Readings from Last 3 Encounters:  07/01/19 290 lb 6.4 oz (131.7 kg)  05/28/19 289  lb 3.2 oz (131.2 kg)  02/25/19 295 lb 3.2 oz (133.9 kg)     Other studies Reviewed: Additional studies/ records that were reviewed today include: Previous labwork, previous EP office notes, and paceart reports.  Assessment and Plan:  1.  Chronic systolic dysfunction s/p [employer] S-ICD  euvolemic today, with NYHA II symptoms. Stable on an appropriate medical regimen Normal ICD function. All sense vectors and battery status stable on his S-ICD.  See Pace Art report No changes today  2. CAD Denies exertional chest pain  Current medicines are reviewed at length with the patient today.   The patient does not have concerns regarding his medicines.  The following changes were made today:  none  Labs/ tests ordered today include:  Orders Placed This Encounter  Procedures  . Basic Metabolic Panel (BMET)  . Magnesium  . CBC  . EKG 12-Lead   Disposition:   Follow up with Dr. Lovena Le in  6 months. Will reach out to HF team about timing of his follow up. Overall, he is doing very well.   Jacalyn Lefevre, PA-C  12/09/2019 1:43 PM  [address]  [place] [address] [phone] (office) [phone] (fax)

## 2019-12-10 ENCOUNTER — Other Ambulatory Visit: Payer: Self-pay

## 2019-12-10 ENCOUNTER — Ambulatory Visit (INDEPENDENT_AMBULATORY_CARE_PROVIDER_SITE_OTHER): Payer: Medicaid Other | Admitting: Student

## 2019-12-10 ENCOUNTER — Encounter: Payer: Self-pay | Admitting: Student

## 2019-12-10 VITALS — BP 124/64 | HR 77 | Ht 76.0 in | Wt 293.2 lb

## 2019-12-10 DIAGNOSIS — I5022 Chronic systolic (congestive) heart failure: Secondary | ICD-10-CM | POA: Diagnosis not present

## 2019-12-10 DIAGNOSIS — I2510 Atherosclerotic heart disease of native coronary artery without angina pectoris: Secondary | ICD-10-CM | POA: Diagnosis not present

## 2019-12-10 DIAGNOSIS — I255 Ischemic cardiomyopathy: Secondary | ICD-10-CM

## 2019-12-10 LAB — CBC
Hematocrit: 42.5 % (ref 37.5–51.0)
Hemoglobin: 14.7 g/dL (ref 13.0–17.7)
MCH: 30.4 pg (ref 26.6–33.0)
MCHC: 34.6 g/dL (ref 31.5–35.7)
MCV: 88 fL (ref 79–97)
Platelets: 294 10*3/uL (ref 150–450)
RBC: 4.83 x10E6/uL (ref 4.14–5.80)
RDW: 13 % (ref 11.6–15.4)
WBC: 6.8 10*3/uL (ref 3.4–10.8)

## 2019-12-10 LAB — CUP PACEART INCLINIC DEVICE CHECK
Date Time Interrogation Session: 20201211083437
Implantable Lead Implant Date: 20200219
Implantable Lead Location: 753862
Implantable Lead Model: 3501
Implantable Lead Serial Number: 168743
Implantable Pulse Generator Implant Date: 20200219
Pulse Gen Serial Number: 257847

## 2019-12-10 LAB — BASIC METABOLIC PANEL
BUN/Creatinine Ratio: 18 (ref 9–20)
BUN: 14 mg/dL (ref 6–20)
CO2: 20 mmol/L (ref 20–29)
Calcium: 9.1 mg/dL (ref 8.7–10.2)
Chloride: 108 mmol/L — ABNORMAL HIGH (ref 96–106)
Creatinine, Ser: 0.76 mg/dL (ref 0.76–1.27)
GFR calc Af Amer: 134 mL/min/{1.73_m2} (ref >59)
GFR calc non Af Amer: 116 mL/min/{1.73_m2} (ref >59)
Glucose: 117 mg/dL — ABNORMAL HIGH (ref 65–99)
Potassium: 4.3 mmol/L (ref 3.5–5.2)
Sodium: 140 mmol/L (ref 134–144)

## 2019-12-10 LAB — MAGNESIUM: Magnesium: 1.8 mg/dL (ref 1.6–2.3)

## 2019-12-10 NOTE — Patient Instructions (Addendum)
Medication Instructions:  none *If you need a refill on your cardiac medications before your next appointment, please call your pharmacy*  Lab Work: today CBC BMET MAGNESIUM If you have labs (blood work) drawn today and your tests are completely normal, you will receive your results only by: Marland Kitchen MyChart Message (if you have MyChart) OR . A paper copy in the mail If you have any lab test that is abnormal or we need to change your treatment, we will call you to review the results.  Testing/Procedures: none  Follow-Up: At [facility], you and your health needs are our priority.  As part of our continuing mission to provide you with exceptional heart care, we have created designated Provider Care Teams.  These Care Teams include your primary Cardiologist (physician) and Advanced Practice Providers (APPs -  Physician Assistants and Nurse Practitioners) who all work together to provide you with the care you need, when you need it.  Your next appointment:   6 MONTHS  The format for your next appointment:   Either In Person or Virtual  Provider:   Dr Lovena Le   Other Instructions Remote monitoring is used to monitor your  ICD from home. This monitoring reduces the number of office visits required to check your device to one time per year. It allows [place] to keep an eye on the functioning of your device to ensure it is working properly. You are scheduled for a device check from home on 12/21/19. You may send your transmission at any time that day. If you have a wireless device, the transmission will be sent automatically. After your physician reviews your transmission, you will receive a postcard with your next transmission date.

## 2019-12-20 ENCOUNTER — Other Ambulatory Visit (HOSPITAL_COMMUNITY): Payer: Self-pay | Admitting: Internal Medicine

## 2019-12-20 MED FILL — ATORVASTATIN 80 MG TABLET: 80 | 30 days supply | Qty: 30 | Fill #0

## 2019-12-20 MED FILL — ENTRESTO 49 MG-51 MG TABLET: 49-51 | 30 days supply | Qty: 60 | Fill #7

## 2019-12-20 MED FILL — CLOPIDOGREL 75 MG TABLET: 75 | 30 days supply | Qty: 30 | Fill #0

## 2019-12-21 ENCOUNTER — Ambulatory Visit (INDEPENDENT_AMBULATORY_CARE_PROVIDER_SITE_OTHER): Payer: Medicaid Other | Admitting: *Deleted

## 2019-12-21 DIAGNOSIS — I5022 Chronic systolic (congestive) heart failure: Secondary | ICD-10-CM | POA: Diagnosis not present

## 2019-12-22 LAB — CUP PACEART REMOTE DEVICE CHECK
Date Time Interrogation Session: 20201223195336
Implantable Lead Implant Date: 20200219
Implantable Lead Location: 753862
Implantable Lead Model: 3501
Implantable Lead Serial Number: 168743
Implantable Pulse Generator Implant Date: 20200219
Pulse Gen Serial Number: 257847

## 2020-01-10 ENCOUNTER — Telehealth (HOSPITAL_COMMUNITY): Payer: Self-pay | Admitting: Vascular Surgery

## 2020-01-10 NOTE — Telephone Encounter (Signed)
Left pt message to make f/u appt w/ db w/ echo 

## 2020-01-19 MED FILL — ENTRESTO 49 MG-51 MG TABLET: 49-51 | 30 days supply | Qty: 60 | Fill #8

## 2020-01-19 MED FILL — CLOPIDOGREL 75 MG TABLET: 75 | 30 days supply | Qty: 30 | Fill #1

## 2020-01-19 MED FILL — ATORVASTATIN 80 MG TABLET: 80 | 30 days supply | Qty: 30 | Fill #1

## 2020-01-28 ENCOUNTER — Ambulatory Visit (HOSPITAL_COMMUNITY)
Admission: RE | Admit: 2020-01-28 | Discharge: 2020-01-28 | Disposition: A | Discharge status: Home / Self Care | Payer: Medicaid Other | Source: Ambulatory Visit | Attending: Internal Medicine | Admitting: Internal Medicine

## 2020-01-28 ENCOUNTER — Other Ambulatory Visit: Payer: Self-pay

## 2020-01-28 DIAGNOSIS — I252 Old myocardial infarction: Secondary | ICD-10-CM | POA: Diagnosis not present

## 2020-01-28 DIAGNOSIS — I5022 Chronic systolic (congestive) heart failure: Secondary | ICD-10-CM | POA: Diagnosis not present

## 2020-01-28 DIAGNOSIS — I2510 Atherosclerotic heart disease of native coronary artery without angina pectoris: Secondary | ICD-10-CM | POA: Diagnosis not present

## 2020-01-28 DIAGNOSIS — E669 Obesity, unspecified: Secondary | ICD-10-CM | POA: Insufficient documentation

## 2020-01-28 NOTE — Progress Notes (Signed)
  Echocardiogram 2D Echocardiogram has been performed.  Sergio Hicks A Sergio Hicks 01/28/2020, 12:06 PM

## 2020-01-31 ENCOUNTER — Encounter (HOSPITAL_COMMUNITY): Payer: Self-pay | Admitting: Internal Medicine

## 2020-01-31 ENCOUNTER — Encounter (HOSPITAL_COMMUNITY): Payer: Medicaid Other | Admitting: Internal Medicine

## 2020-01-31 ENCOUNTER — Ambulatory Visit (HOSPITAL_COMMUNITY)
Admission: RE | Admit: 2020-01-31 | Discharge: 2020-01-31 | Disposition: A | Discharge status: Home / Self Care | Payer: Medicaid Other | Source: Ambulatory Visit | Attending: Internal Medicine | Admitting: Internal Medicine

## 2020-01-31 ENCOUNTER — Other Ambulatory Visit: Payer: Self-pay

## 2020-01-31 ENCOUNTER — Other Ambulatory Visit (HOSPITAL_COMMUNITY): Payer: Medicaid Other

## 2020-01-31 VITALS — BP 122/74 | HR 78 | Wt 291.0 lb

## 2020-01-31 DIAGNOSIS — I252 Old myocardial infarction: Secondary | ICD-10-CM | POA: Insufficient documentation

## 2020-01-31 DIAGNOSIS — J45909 Unspecified asthma, uncomplicated: Secondary | ICD-10-CM | POA: Diagnosis not present

## 2020-01-31 DIAGNOSIS — I2510 Atherosclerotic heart disease of native coronary artery without angina pectoris: Secondary | ICD-10-CM

## 2020-01-31 DIAGNOSIS — I5022 Chronic systolic (congestive) heart failure: Secondary | ICD-10-CM

## 2020-01-31 DIAGNOSIS — Z87891 Personal history of nicotine dependence: Secondary | ICD-10-CM | POA: Diagnosis not present

## 2020-01-31 DIAGNOSIS — Z955 Presence of coronary angioplasty implant and graft: Secondary | ICD-10-CM | POA: Insufficient documentation

## 2020-01-31 DIAGNOSIS — Z79899 Other long term (current) drug therapy: Secondary | ICD-10-CM | POA: Insufficient documentation

## 2020-01-31 DIAGNOSIS — Z7901 Long term (current) use of anticoagulants: Secondary | ICD-10-CM | POA: Insufficient documentation

## 2020-01-31 DIAGNOSIS — Z7982 Long term (current) use of aspirin: Secondary | ICD-10-CM | POA: Diagnosis not present

## 2020-01-31 DIAGNOSIS — E669 Obesity, unspecified: Secondary | ICD-10-CM | POA: Diagnosis not present

## 2020-01-31 DIAGNOSIS — Z7902 Long term (current) use of antithrombotics/antiplatelets: Secondary | ICD-10-CM | POA: Diagnosis not present

## 2020-01-31 LAB — CBC
HCT: 45.3 % (ref 39.0–52.0)
Hemoglobin: 15.1 g/dL (ref 13.0–17.0)
MCH: 30.1 pg (ref 26.0–34.0)
MCHC: 33.3 g/dL (ref 30.0–36.0)
MCV: 90.2 fL (ref 80.0–100.0)
Platelets: 284 10*3/uL (ref 150–400)
RBC: 5.02 MIL/uL (ref 4.22–5.81)
RDW: 12.5 % (ref 11.5–15.5)
WBC: 8.6 10*3/uL (ref 4.0–10.5)
nRBC: 0 % (ref 0.0–0.2)

## 2020-01-31 LAB — COMPREHENSIVE METABOLIC PANEL
ALT: 24 U/L (ref 0–44)
AST: 19 U/L (ref 15–41)
Albumin: 4.1 g/dL (ref 3.5–5.0)
Alkaline Phosphatase: 38 U/L (ref 38–126)
Anion gap: 10 (ref 5–15)
BUN: 13 mg/dL (ref 6–20)
CO2: 22 mmol/L (ref 22–32)
Calcium: 9.4 mg/dL (ref 8.9–10.3)
Chloride: 103 mmol/L (ref 98–111)
Creatinine, Ser: 0.99 mg/dL (ref 0.61–1.24)
GFR calc Af Amer: >60 mL/min (ref >60)
GFR calc non Af Amer: >60 mL/min (ref >60)
Glucose, Bld: 103 mg/dL — ABNORMAL HIGH (ref 70–99)
Potassium: 3.7 mmol/L (ref 3.5–5.1)
Sodium: 135 mmol/L (ref 135–145)
Total Bilirubin: 0.7 mg/dL (ref 0.3–1.2)
Total Protein: 7.2 g/dL (ref 6.5–8.1)

## 2020-01-31 LAB — LIPID PANEL
Cholesterol: 147 mg/dL (ref 0–200)
HDL: 38 mg/dL — ABNORMAL LOW (ref >40)
LDL Cholesterol: 86 mg/dL (ref 0–99)
Total CHOL/HDL Ratio: 3.9 RATIO
Triglycerides: 116 mg/dL (ref <150)
VLDL: 23 mg/dL (ref 0–40)

## 2020-01-31 LAB — BRAIN NATRIURETIC PEPTIDE: B Natriuretic Peptide: 64.4 pg/mL (ref 0.0–100.0)

## 2020-01-31 MED ORDER — CARVEDILOL 6.25 MG PO TABS: 9.3750 mg | ORAL_TABLET | Freq: Two times a day (BID) | ORAL | 6 refills | Status: DC

## 2020-01-31 MED FILL — CARVEDILOL 6.25 MG TABLET: 6.25 | 30 days supply | Qty: 90 | Fill #0

## 2020-01-31 MED FILL — CARVEDILOL 6.25 MG TABLET: 6.25 | 30 days supply | Qty: 90 | Fill #0 | Status: TO

## 2020-01-31 NOTE — Patient Instructions (Signed)
Increase Carvedilol to 9.375 mg (1 & 1/2 tabs) Twice daily   Labs done today, your results will be available in MyChart, we will contact you for abnormal readings.  Your physician recommends that you schedule a follow-up appointment in: 4 months  If you have any questions or concerns before your next appointment please send [place] a message through [place] or call our office at [phone].  At the [facility], you and your health needs are our priority. As part of our continuing mission to provide you with exceptional heart care, we have created designated Provider Care Teams. These Care Teams include your primary Cardiologist (physician) and Advanced Practice Providers (APPs- Physician Assistants and Nurse Practitioners) who all work together to provide you with the care you need, when you need it.   You may see any of the following providers on your designated Care Team at your next follow up: Marland Kitchen Dr Arvilla Meres . Dr Marca Ancona . Tonye Becket, NP . Robbie Lis, PA . Karle Plumber, PharmD   Please be sure to bring in all your medications bottles to every appointment.

## 2020-01-31 NOTE — Addendum Note (Signed)
Encounter addended by: Noralee Space, RN on: 01/31/2020 4:23 PM  Actions taken: Pharmacy for encounter modified, Order list changed, Diagnosis association updated, Charge Capture section accepted, Clinical Note Signed

## 2020-01-31 NOTE — Progress Notes (Signed)
PCP: Primary HF Cardiologist: Dr Gala Romney   Advanced HF Clinic Note   HPI: Sergio Hicks is a 39 y.o. 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   Admitted 01/18/2018 with OOH anterior MI and severe ICM. Taken urgently for cath which showed 100% LAD occlusion as below. ECHO1/20/19 LVEF 30%. Initially treated medically but developed persistent CP so taken for PCI and pt is s/p DES to proximal/mid LAD 01/20/18.  S/p SQ [employer] ICD with Dr Ladona Ridgel on 02/17/19.  Echo 01/28/20 EF 35-40% RV normal. Personally reviewed and I felt 30-35%  He returns today for regular follow up. Doing pretty well. Working PT at [employer]. Able to do ADLs without too much problem. Still not going to gym due to COVID. No CP, edema, orthopnea or PND. Taking all meds without problem.   Echo 07/2018: EF 25-30% Echo 4/19 EF 30-35%.   ECHO 01/18/2018 LVEF 30-35% with large LAD territory WMA concerning for   ischemia/infarct, grade 1 DD with elevated LV filling pressure,   trivial MR, mild LAE, elevated RA pressure.  Review of systems complete and found to be negative unless listed in HPI.   SH:  Social History   Socioeconomic History  . Marital status: Single    Spouse name: Not on file  . Number of children: Not on file  . Years of education: Not on file  . Highest education level: Not on file  Occupational History  . Occupation: chef  Tobacco Use  . Smoking status: Former Smoker    Packs/day: 0.00    Types: Cigarettes  . Smokeless tobacco: Never Used  . Tobacco comment: 2019  Substance and Sexual Activity  . Alcohol use: Yes    Alcohol/week: 1.0 standard drinks    Types: 1 Glasses of wine per week    Comment: 1-2 days a week   . Drug use: Never  . Sexual activity: Not on file  Other Topics Concern  . Not on file  Social History Narrative  . Not on file   Social  Determinants of Health   Financial Resource Strain:   . Difficulty of Paying Living Expenses: Not on file  Food Insecurity:   . Worried About Programme researcher, broadcasting/film/video in the Last Year: Not on file  . Ran Out of Food in the Last Year: Not on file  Transportation Needs:   . Lack of Transportation (Medical): Not on file  . Lack of Transportation (Non-Medical): Not on file  Physical Activity:   . Days of Exercise per Week: Not on file  . Minutes of Exercise per Session: Not on file  Stress:   . Feeling of Stress : Not on file  Social Connections:   . Frequency of Communication with Friends and Family: Not on file  . Frequency of Social Gatherings with Friends and Family: Not on file  . Attends Religious Services: Not on file  . Active Member of Clubs or Organizations: Not on file  . Attends Banker Meetings: Not on file  . Marital Status: Not on file  Intimate Partner Violence:   . Fear of Current or Ex-Partner: Not on file  . Emotionally Abused: Not on file  . Physically Abused: Not on file  . Sexually Abused: Not on file   FH:  Family History  Adopted: Yes   Past Medical History:  Diagnosis Date  .  Asthma   . CHF (congestive heart failure) ([place]) 12/2016  . Coronary artery disease   . Myocardial infarction [facility])    Current Outpatient Medications  Medication Sig Dispense Refill  . albuterol (PROVENTIL HFA;VENTOLIN HFA) 108 (90 Base) MCG/ACT inhaler Inhale 1-2 puffs into the lungs every 6 (six) hours as needed for wheezing or shortness of breath.    Marland Kitchen aspirin EC 81 MG EC tablet Take 1 tablet (81 mg total) by mouth daily. 30 tablet 6  . atorvastatin (LIPITOR) 80 MG tablet TAKE 1 TABLET BY MOUTH DAILY AT 6 PM. 30 tablet 3  . carvedilol (COREG) 6.25 MG tablet TAKE 1 TABLET BY MOUTH 2 TIMES DAILY. 180 tablet 0  . cholecalciferol (VITAMIN D3) 25 MCG (1000 UT) tablet Take 1,000 Units by mouth daily.    . clopidogrel (PLAVIX) 75 MG tablet TAKE 1 TABLET BY MOUTH DAILY WITH  BREAKFAST 30 tablet 3  . furosemide (LASIX) 40 MG tablet Take 1 tablet (40 mg total) by mouth as needed. 30 tablet   . nitroGLYCERIN (NITROSTAT) 0.4 MG SL tablet Place 1 tablet (0.4 mg total) under the tongue every 5 (five) minutes as needed for chest pain. 60 tablet 0  . potassium chloride SA (K-DUR,KLOR-CON) 20 MEQ tablet Take 40 mEq by mouth daily as needed (when taking furosemide).     . sacubitril-valsartan (ENTRESTO) 49-51 MG Take 1 tablet by mouth 2 (two) times daily. 60 tablet 11  . spironolactone (ALDACTONE) 25 MG tablet Take 1 tablet (25 mg total) by mouth daily. 90 tablet 3   No current facility-administered medications for this encounter.   Vitals:   01/31/20 1524  BP: 122/74  Pulse: 78  SpO2: 98%  Weight: 132 kg (291 lb)   Wt Readings from Last 3 Encounters:  01/31/20 132 kg (291 lb)  12/10/19 133 kg (293 lb 3.2 oz)  07/01/19 131.7 kg (290 lb 6.4 oz)    PHYSICAL EXAM: General:  Well appearing. No resp difficulty HEENT: normal Neck: supple. no JVD. Carotids 2+ bilat; no bruits. No lymphadenopathy or thryomegaly appreciated. Cor: PMI nondisplaced. Regular rate & rhythm. No rubs, gallops or murmurs. Lungs: clear Abdomen: obese soft, nontender, nondistended. No hepatosplenomegaly. No bruits or masses. Good bowel sounds. Extremities: no cyanosis, clubbing, rash, edema Neuro: alert & orientedx3, cranial nerves grossly intact. moves all 4 extremities w/o difficulty. Affect pleasant  ECG: NSR 80 anteroseptal qs Personally reviewed   ASSESSMENT & PLAN: 1. 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  - Echo EF 4/19 30-35%.  - Echo 8/19 EF 25-30%  - Echo 1/21: EF 35-40% RV ok. (I thought 30-35%) - S/p SQ [employer] ICD 12/18/19 - Stable NYHA II - Volume status stable  - Continue lasix prn. Doesn't need it.  - Continue Entresto 49/51 mg BID. - Increase Carvedilol to 9.375 bid   - Continue spironolactone 25 mg daily - Reinforced fluid  restriction to < 2 L daily, sodium restriction to less than 2000 mg daily, and the importance of daily weights.    2. CAD- out of hospital anterior MI with late presentation - cath 1/20 with total occlusion of LAD with R to L collaterals.  - 01/20/19 S/P DES to proximal/mid LAD. - No s/s ischemia.  - Continue high dose statin, aspirin, and plavix. - Continue Lipitor 80 mg daily.  - Check lipids today  Glori Bickers, MD  3:51 PM

## 2020-02-18 ENCOUNTER — Encounter (HOSPITAL_COMMUNITY): Payer: Self-pay

## 2020-02-22 MED FILL — ATORVASTATIN 80 MG TABLET: 80 | 30 days supply | Qty: 30 | Fill #2

## 2020-02-22 MED FILL — ENTRESTO 49 MG-51 MG TABLET: 49-51 | 30 days supply | Qty: 60 | Fill #9

## 2020-02-22 MED FILL — SPIRONOLACTONE 25 MG TABS: 25 | 90 days supply | Qty: 90 | Fill #2

## 2020-02-22 MED FILL — CLOPIDOGREL 75 MG TABLET: 75 | 30 days supply | Qty: 30 | Fill #2

## 2020-03-01 ENCOUNTER — Encounter: Payer: Self-pay | Admitting: Internal Medicine

## 2020-03-15 ENCOUNTER — Other Ambulatory Visit: Payer: Self-pay

## 2020-03-15 ENCOUNTER — Ambulatory Visit (INDEPENDENT_AMBULATORY_CARE_PROVIDER_SITE_OTHER): Payer: Medicaid Other | Admitting: Internal Medicine

## 2020-03-15 ENCOUNTER — Encounter (HOSPITAL_COMMUNITY): Payer: Self-pay

## 2020-03-15 VITALS — BP 102/64 | HR 70 | Ht 76.0 in | Wt 296.0 lb

## 2020-03-15 DIAGNOSIS — Z95810 Presence of automatic (implantable) cardiac defibrillator: Secondary | ICD-10-CM | POA: Diagnosis not present

## 2020-03-15 DIAGNOSIS — I255 Ischemic cardiomyopathy: Secondary | ICD-10-CM

## 2020-03-15 NOTE — Progress Notes (Signed)
HPI Sergio Hicks returns today for followup. He is a pleasant 39 yo man with a h/o ischemic CM, chronic class 2 CHF, s/p S-ICD insertion about a year ago. He has done well in the interim with no chest pain or sob. He is working full time at [employer]. He denies peripheral edema. No pain at his ICD insertion site. No Known Allergies   Current Outpatient Medications  Medication Sig Dispense Refill  . albuterol (PROVENTIL HFA;VENTOLIN HFA) 108 (90 Base) MCG/ACT inhaler Inhale 1-2 puffs into the lungs every 6 (six) hours as needed for wheezing or shortness of breath.    Marland Kitchen aspirin EC 81 MG EC tablet Take 1 tablet (81 mg total) by mouth daily. 30 tablet 6  . atorvastatin (LIPITOR) 80 MG tablet TAKE 1 TABLET BY MOUTH DAILY AT 6 PM. 30 tablet 3  . carvedilol (COREG) 6.25 MG tablet Take 1.5 tablets (9.375 mg total) by mouth 2 (two) times daily. 90 tablet 6  . cholecalciferol (VITAMIN D3) 25 MCG (1000 UT) tablet Take 1,000 Units by mouth daily.    . clopidogrel (PLAVIX) 75 MG tablet TAKE 1 TABLET BY MOUTH DAILY WITH BREAKFAST 30 tablet 3  . furosemide (LASIX) 40 MG tablet Take 1 tablet (40 mg total) by mouth as needed. 30 tablet   . nitroGLYCERIN (NITROSTAT) 0.4 MG SL tablet Place 1 tablet (0.4 mg total) under the tongue every 5 (five) minutes as needed for chest pain. 60 tablet 0  . potassium chloride SA (K-DUR,KLOR-CON) 20 MEQ tablet Take 40 mEq by mouth daily as needed (when taking furosemide).     . sacubitril-valsartan (ENTRESTO) 49-51 MG Take 1 tablet by mouth 2 (two) times daily. 60 tablet 11  . spironolactone (ALDACTONE) 25 MG tablet Take 1 tablet (25 mg total) by mouth daily. 90 tablet 3   No current facility-administered medications for this visit.     Past Medical History:  Diagnosis Date  . Asthma   . CHF (congestive heart failure) ([place]) 12/2016  . Coronary artery disease   . Myocardial infarction ([place])     ROS:   All systems reviewed and negative except as noted in the  HPI.   Past Surgical History:  Procedure Laterality Date  . CORONARY STENT INTERVENTION N/A 01/20/2018   Procedure: CORONARY STENT INTERVENTION;  Surgeon: Martinique, Peter M, MD;  Location: [place] [facility];  Service: Cardiovascular;  Laterality: N/A;  . LEFT HEART CATH AND CORONARY ANGIOGRAPHY N/A 01/18/2018   Procedure: LEFT HEART CATH AND CORONARY ANGIOGRAPHY;  Surgeon: Lorretta Harp, MD;  Location: [place] [facility];  Service: Cardiovascular;  Laterality: N/A;  . SUBQ ICD IMPLANT  02/17/2019  . SUBQ ICD IMPLANT N/A 02/17/2019   Procedure: SUBQ ICD IMPLANT;  Surgeon: Evans Lance, MD;  Location: [place] [facility];  Service: Cardiovascular;  Laterality: N/A;     Family History  Adopted: Yes     Social History   Socioeconomic History  . Marital status: Single    Spouse name: Not on file  . Number of children: Not on file  . Years of education: Not on file  . Highest education level: Not on file  Occupational History  . Occupation: chef  Tobacco Use  . Smoking status: Former Smoker    Packs/day: 0.00    Types: Cigarettes  . Smokeless tobacco: Never Used  . Tobacco comment: 2019  Substance and Sexual Activity  . Alcohol use: Yes    Alcohol/week: 1.0  standard drinks    Types: 1 Glasses of wine per week    Comment: 1-2 days a week   . Drug use: Never  . Sexual activity: Not on file  Other Topics Concern  . Not on file  Social History Narrative  . Not on file   Social Determinants of Health   Financial Resource Strain:   . Difficulty of Paying Living Expenses:   Food Insecurity:   . Worried About Programme researcher, broadcasting/film/video in the Last Year:   . Barista in the Last Year:   Transportation Needs:   . Freight forwarder (Medical):   Marland Kitchen Lack of Transportation (Non-Medical):   Physical Activity:   . Days of Exercise per Week:   . Minutes of Exercise per Session:   Stress:   . Feeling of Stress :   Social Connections:   . Frequency of Communication with  Friends and Family:   . Frequency of Social Gatherings with Friends and Family:   . Attends Religious Services:   . Active Member of Clubs or Organizations:   . Attends Banker Meetings:   Marland Kitchen Marital Status:   Intimate Partner Violence:   . Fear of Current or Ex-Partner:   . Emotionally Abused:   Marland Kitchen Physically Abused:   . Sexually Abused:      BP 102/64   Pulse 70   Ht 6\' 4"  (1.93 m)   Wt 296 lb (134.3 kg)   BMI 36.03 kg/m   Physical Exam:  Well appearing NAD HEENT: Unremarkable Neck:  No JVD, no thyromegally Lymphatics:  No adenopathy Back:  No CVA tenderness Lungs:  Clear with no wheezes HEART:  Regular rate rhythm, no murmurs, no rubs, no clicks Abd:  soft, positive bowel sounds, no organomegally, no rebound, no guarding Ext:  2 plus pulses, no edema, no cyanosis, no clubbing Skin:  No rashes no nodules Neuro:  CN II through XII intact, motor grossly intact  DEVICE  Normal device function.  See PaceArt for details.   Assess/Plan: 1. Chronic systolic heart failure - his symptoms are class 2A. He will continue his current medical therapy. His volume status looks good. 2. ICD - his Boston S-ICD has an advisory notification which I discussed him. His device is working normally. We reviewed the very small risk of lead failure and premature battery depletion.  3. Obesity - he is working out regularly. I encouraged him to continue. 4. CAD - he denies anginal symptoms. He will continue his current meds. He is on aggressive statin therapy as well.  Sergio Hicks,M.D  Sharlot Gowda.D.

## 2020-03-15 NOTE — Patient Instructions (Signed)
Medication Instructions:  Your physician recommends that you continue on your current medications as directed. Please refer to the Current Medication list given to you today.  Labwork: None ordered.  Testing/Procedures: None ordered.  Follow-Up: Your physician wants you to follow-up in: one year with Dr. Ladona Ridgel.   You will receive a reminder letter in the mail two months in advance. If you don't receive a letter, please call our office to schedule the follow-up appointment.  Remote monitoring is used to monitor your ICD from home. This monitoring reduces the number of office visits required to check your device to one time per year. It allows [place] to keep an eye on the functioning of your device to ensure it is working properly. You are scheduled for a device check from home on 03/21/2020. You may send your transmission at any time that day. If you have a wireless device, the transmission will be sent automatically. After your physician reviews your transmission, you will receive a postcard with your next transmission date.  Any Other Special Instructions Will Be Listed Below (If Applicable).  If you need a refill on your cardiac medications before your next appointment, please call your pharmacy.

## 2020-03-21 ENCOUNTER — Ambulatory Visit (INDEPENDENT_AMBULATORY_CARE_PROVIDER_SITE_OTHER): Payer: Medicaid Other | Admitting: *Deleted

## 2020-03-21 DIAGNOSIS — I5022 Chronic systolic (congestive) heart failure: Secondary | ICD-10-CM | POA: Diagnosis not present

## 2020-03-23 LAB — CUP PACEART REMOTE DEVICE CHECK
Battery Remaining Percentage: 88 %
Date Time Interrogation Session: 20210324234700
Implantable Lead Implant Date: 20200219
Implantable Lead Location: 753862
Implantable Lead Model: 3501
Implantable Lead Serial Number: 168743
Implantable Pulse Generator Implant Date: 20200219
Pulse Gen Serial Number: 257847

## 2020-03-27 ENCOUNTER — Other Ambulatory Visit (HOSPITAL_COMMUNITY): Payer: Self-pay | Admitting: Internal Medicine

## 2020-03-27 MED FILL — CLOPIDOGREL 75 MG TABLET: 75 | 30 days supply | Qty: 30 | Fill #3

## 2020-03-27 MED FILL — CARVEDILOL 6.25 MG TABLET: 6.25 | 30 days supply | Qty: 90 | Fill #1

## 2020-03-27 MED FILL — ATORVASTATIN 80 MG TABLET: 80 | 30 days supply | Qty: 30 | Fill #3

## 2020-03-29 ENCOUNTER — Other Ambulatory Visit (HOSPITAL_COMMUNITY): Payer: Self-pay | Admitting: *Deleted

## 2020-03-29 ENCOUNTER — Other Ambulatory Visit (HOSPITAL_COMMUNITY): Payer: Self-pay | Admitting: Internal Medicine

## 2020-03-29 ENCOUNTER — Encounter (HOSPITAL_COMMUNITY): Payer: Self-pay

## 2020-03-29 ENCOUNTER — Telehealth (HOSPITAL_COMMUNITY): Payer: Self-pay | Admitting: *Deleted

## 2020-03-29 ENCOUNTER — Encounter (HOSPITAL_COMMUNITY): Payer: Self-pay | Admitting: *Deleted

## 2020-03-29 MED ORDER — CLOPIDOGREL BISULFATE 75 MG PO TABS: 75.0000 mg | ORAL_TABLET | Freq: Every day | ORAL | 3 refills | Status: DC

## 2020-03-29 MED ORDER — POTASSIUM CHLORIDE CRYS ER 20 MEQ PO TBCR: 40.0000 meq | EXTENDED_RELEASE_TABLET | Freq: Every day | ORAL | 3 refills | Status: DC | PRN

## 2020-03-29 MED ORDER — CARVEDILOL 6.25 MG PO TABS: 9.3750 mg | ORAL_TABLET | Freq: Two times a day (BID) | ORAL | 3 refills | Status: DC

## 2020-03-29 MED ORDER — SPIRONOLACTONE 25 MG PO TABS: 25.0000 mg | ORAL_TABLET | Freq: Every day | ORAL | 3 refills | Status: DC

## 2020-03-29 MED ORDER — FUROSEMIDE 40 MG PO TABS: 40.0000 mg | ORAL_TABLET | ORAL | 3 refills | Status: DC | PRN

## 2020-03-29 MED ORDER — ATORVASTATIN CALCIUM 80 MG PO TABS: 80.0000 mg | ORAL_TABLET | Freq: Every day | ORAL | 3 refills | Status: DC

## 2020-03-29 NOTE — Telephone Encounter (Signed)
entresto patient assistance paperwork left at front desk for patient to sign and give back to me. Karle Plumber will fax with letter stating patient is no longer insured.

## 2020-04-05 ENCOUNTER — Encounter (HOSPITAL_COMMUNITY): Payer: Self-pay

## 2020-04-07 ENCOUNTER — Telehealth (HOSPITAL_COMMUNITY): Payer: Self-pay | Admitting: Pharmacist

## 2020-04-07 NOTE — Telephone Encounter (Signed)
Sent in [employer] application to [employer] for [employer]. Of note, patient's [place] Medicaid insurance recently expired.    Application pending, will continue to follow.  Karle Plumber, PharmD, BCPS, BCCP, CPP Heart Failure Clinic Pharmacist [phone]

## 2020-04-10 ENCOUNTER — Telehealth (HOSPITAL_COMMUNITY): Payer: Self-pay | Admitting: *Deleted

## 2020-04-10 NOTE — Telephone Encounter (Signed)
Patient assistance application pending. Samples of entresto given last week. Gift card left at front desk.

## 2020-04-12 NOTE — Telephone Encounter (Signed)
Advanced Heart Failure Patient Advocate Encounter   Patient was approved to receive Entresto from [employer]. Left VM.  Patient ID: [id number] Effective dates: 04/11/20 through 04/11/21  Karle Plumber, PharmD, BCPS, BCCP, CPP Heart Failure Clinic Pharmacist [phone]

## 2020-04-21 ENCOUNTER — Telehealth (HOSPITAL_COMMUNITY): Payer: Self-pay | Admitting: Pharmacist

## 2020-04-21 NOTE — Telephone Encounter (Signed)
Advanced Heart Failure Patient Advocate Encounter  Prior Authorization for Sergio Hicks has been approved by Medicaid.    PA# [id number] Effective dates: 04/21/2020 - 04/16/2021  Patients co-pay is $3.00  Karle Plumber, PharmD, BCPS, BCCP, CPP Heart Failure Clinic Pharmacist [phone]

## 2020-04-27 MED FILL — CLOPIDOGREL 75 MG TABLET: 75 | 30 days supply | Qty: 30 | Fill #0

## 2020-05-03 MED FILL — ENTRESTO 49 MG-51 MG TABLET: 49-51 | 90 days supply | Qty: 180 | Fill #0

## 2020-05-22 MED FILL — ATORVASTATIN 80 MG TABLET: 80 | 30 days supply | Qty: 30 | Fill #0

## 2020-06-02 MED FILL — CLOPIDOGREL 75 MG TABLET: 75 | 30 days supply | Qty: 30 | Fill #1

## 2020-06-02 MED FILL — CARVEDILOL 6.25 MG TABLET: 6.25 | 30 days supply | Qty: 90 | Fill #2

## 2020-06-02 MED FILL — SPIRONOLACTONE 25 MG TABS: 25 | 90 days supply | Qty: 90 | Fill #3

## 2020-06-05 ENCOUNTER — Encounter (HOSPITAL_COMMUNITY): Payer: Medicaid Other | Admitting: Internal Medicine

## 2020-06-20 ENCOUNTER — Ambulatory Visit (INDEPENDENT_AMBULATORY_CARE_PROVIDER_SITE_OTHER): Payer: Medicaid Other | Admitting: *Deleted

## 2020-06-20 DIAGNOSIS — I255 Ischemic cardiomyopathy: Secondary | ICD-10-CM

## 2020-06-21 ENCOUNTER — Telehealth: Payer: Self-pay

## 2020-06-21 LAB — CUP PACEART REMOTE DEVICE CHECK
Battery Remaining Percentage: 85 %
Date Time Interrogation Session: 20210623115500
Implantable Lead Implant Date: 20200219
Implantable Lead Location: 753862
Implantable Lead Model: 3501
Implantable Lead Serial Number: 168743
Implantable Pulse Generator Implant Date: 20200219
Pulse Gen Serial Number: 257847

## 2020-06-21 NOTE — Telephone Encounter (Signed)
Left message for patient to remind of missed remote transmission.  

## 2020-06-22 NOTE — Progress Notes (Signed)
Remote ICD transmission.   

## 2020-06-26 MED FILL — ATORVASTATIN 80 MG TABLET: 80 | 30 days supply | Qty: 30 | Fill #1

## 2020-06-28 MED FILL — CLOPIDOGREL 75 MG TABLET: 75 | 30 days supply | Qty: 30 | Fill #2

## 2020-06-28 MED FILL — CARVEDILOL 6.25 MG TABLET: 6.25 | 30 days supply | Qty: 90 | Fill #3

## 2020-07-19 ENCOUNTER — Telehealth (HOSPITAL_COMMUNITY): Payer: Self-pay | Admitting: *Deleted

## 2020-07-19 ENCOUNTER — Encounter (HOSPITAL_COMMUNITY): Payer: Self-pay

## 2020-07-19 NOTE — Telephone Encounter (Signed)
Pt dropped off release form from Dr Shellia Carwin, DDS, pt has a cracked tooth that needs to be extracted and they need to know if he should hold his Plavix and ASA for this.  Per Dr Gala Romney: "Would prefer pt stay on ASA/Plavix without stopping if possible. Otherwise ok to proceed"  Note faxed back to them at [phone], pt is aware

## 2020-07-31 LAB — CUP PACEART INCLINIC DEVICE CHECK
Date Time Interrogation Session: 20210317100032
Implantable Lead Implant Date: 20200219
Implantable Lead Location: 753862
Implantable Lead Model: 3501
Implantable Lead Serial Number: 168743
Implantable Pulse Generator Implant Date: 20200219
Pulse Gen Serial Number: 257847

## 2020-07-31 MED FILL — ATORVASTATIN 80 MG TABLET: 80 | 30 days supply | Qty: 30 | Fill #2

## 2020-07-31 MED FILL — CLOPIDOGREL 75 MG TABLET: 75 | 30 days supply | Qty: 30 | Fill #3

## 2020-08-13 ENCOUNTER — Observation Stay (HOSPITAL_COMMUNITY)
Admission: EM | Admit: 2020-08-13 | Discharge: 2020-08-14 | DRG: 155 | Disposition: A | Discharge status: Home / Self Care | Payer: No Typology Code available for payment source | Attending: Physician Assistant | Admitting: Physician Assistant

## 2020-08-13 ENCOUNTER — Other Ambulatory Visit: Payer: Self-pay

## 2020-08-13 ENCOUNTER — Emergency Department (HOSPITAL_COMMUNITY): Payer: No Typology Code available for payment source

## 2020-08-13 ENCOUNTER — Encounter (HOSPITAL_COMMUNITY): Payer: Self-pay

## 2020-08-13 DIAGNOSIS — T07XXXA Unspecified multiple injuries, initial encounter: Secondary | ICD-10-CM | POA: Diagnosis present

## 2020-08-13 DIAGNOSIS — Y92410 Unspecified street and highway as the place of occurrence of the external cause: Secondary | ICD-10-CM | POA: Insufficient documentation

## 2020-08-13 DIAGNOSIS — S27329A Contusion of lung, unspecified, initial encounter: Secondary | ICD-10-CM

## 2020-08-13 DIAGNOSIS — Z20822 Contact with and (suspected) exposure to covid-19: Secondary | ICD-10-CM | POA: Insufficient documentation

## 2020-08-13 DIAGNOSIS — S02401A Maxillary fracture, unspecified, initial encounter for closed fracture: Secondary | ICD-10-CM | POA: Diagnosis not present

## 2020-08-13 DIAGNOSIS — S0240CA Maxillary fracture, right side, initial encounter for closed fracture: Secondary | ICD-10-CM | POA: Diagnosis present

## 2020-08-13 DIAGNOSIS — Z79899 Other long term (current) drug therapy: Secondary | ICD-10-CM | POA: Diagnosis not present

## 2020-08-13 DIAGNOSIS — I2510 Atherosclerotic heart disease of native coronary artery without angina pectoris: Secondary | ICD-10-CM | POA: Diagnosis not present

## 2020-08-13 DIAGNOSIS — S20213A Contusion of bilateral front wall of thorax, initial encounter: Secondary | ICD-10-CM | POA: Diagnosis present

## 2020-08-13 DIAGNOSIS — I5022 Chronic systolic (congestive) heart failure: Secondary | ICD-10-CM | POA: Insufficient documentation

## 2020-08-13 DIAGNOSIS — R109 Unspecified abdominal pain: Secondary | ICD-10-CM | POA: Diagnosis not present

## 2020-08-13 DIAGNOSIS — Z87891 Personal history of nicotine dependence: Secondary | ICD-10-CM | POA: Insufficient documentation

## 2020-08-13 DIAGNOSIS — J45909 Unspecified asthma, uncomplicated: Secondary | ICD-10-CM | POA: Insufficient documentation

## 2020-08-13 DIAGNOSIS — R040 Epistaxis: Secondary | ICD-10-CM | POA: Diagnosis not present

## 2020-08-13 DIAGNOSIS — Z7982 Long term (current) use of aspirin: Secondary | ICD-10-CM | POA: Insufficient documentation

## 2020-08-13 DIAGNOSIS — I252 Old myocardial infarction: Secondary | ICD-10-CM

## 2020-08-13 DIAGNOSIS — Y939 Activity, unspecified: Secondary | ICD-10-CM | POA: Insufficient documentation

## 2020-08-13 DIAGNOSIS — S20219A Contusion of unspecified front wall of thorax, initial encounter: Secondary | ICD-10-CM | POA: Insufficient documentation

## 2020-08-13 DIAGNOSIS — Z7902 Long term (current) use of antithrombotics/antiplatelets: Secondary | ICD-10-CM

## 2020-08-13 DIAGNOSIS — S27322A Contusion of lung, bilateral, initial encounter: Secondary | ICD-10-CM | POA: Diagnosis not present

## 2020-08-13 DIAGNOSIS — I255 Ischemic cardiomyopathy: Secondary | ICD-10-CM | POA: Diagnosis present

## 2020-08-13 DIAGNOSIS — S022XXA Fracture of nasal bones, initial encounter for closed fracture: Secondary | ICD-10-CM | POA: Diagnosis present

## 2020-08-13 DIAGNOSIS — Y999 Unspecified external cause status: Secondary | ICD-10-CM | POA: Diagnosis not present

## 2020-08-13 DIAGNOSIS — Z955 Presence of coronary angioplasty implant and graft: Secondary | ICD-10-CM

## 2020-08-13 DIAGNOSIS — Z95810 Presence of automatic (implantable) cardiac defibrillator: Secondary | ICD-10-CM

## 2020-08-13 LAB — URINALYSIS, ROUTINE W REFLEX MICROSCOPIC
Bilirubin Urine: NEGATIVE
Glucose, UA: NEGATIVE mg/dL
Hgb urine dipstick: NEGATIVE
Ketones, ur: 20 mg/dL — AB
Leukocytes,Ua: NEGATIVE
Nitrite: NEGATIVE
Protein, ur: NEGATIVE mg/dL
Specific Gravity, Urine: >1.046 — ABNORMAL HIGH (ref 1.005–1.030)
pH: 6 (ref 5.0–8.0)

## 2020-08-13 LAB — CBC WITH DIFFERENTIAL/PLATELET
Abs Immature Granulocytes: 0.16 10*3/uL — ABNORMAL HIGH (ref 0.00–0.07)
Basophils Absolute: 0 10*3/uL (ref 0.0–0.1)
Basophils Relative: 0 %
Eosinophils Absolute: 0 10*3/uL (ref 0.0–0.5)
Eosinophils Relative: 0 %
HCT: 39 % (ref 39.0–52.0)
Hemoglobin: 13.3 g/dL (ref 13.0–17.0)
Immature Granulocytes: 1 %
Lymphocytes Relative: 8 %
Lymphs Abs: 1.2 10*3/uL (ref 0.7–4.0)
MCH: 30.6 pg (ref 26.0–34.0)
MCHC: 34.1 g/dL (ref 30.0–36.0)
MCV: 89.7 fL (ref 80.0–100.0)
Monocytes Absolute: 0.8 10*3/uL (ref 0.1–1.0)
Monocytes Relative: 6 %
Neutro Abs: 11.8 10*3/uL — ABNORMAL HIGH (ref 1.7–7.7)
Neutrophils Relative %: 85 %
Platelets: 297 10*3/uL (ref 150–400)
RBC: 4.35 MIL/uL (ref 4.22–5.81)
RDW: 12.6 % (ref 11.5–15.5)
WBC: 14 10*3/uL — ABNORMAL HIGH (ref 4.0–10.5)
nRBC: 0 % (ref 0.0–0.2)

## 2020-08-13 LAB — RAPID URINE DRUG SCREEN, HOSP PERFORMED
Amphetamines: NOT DETECTED
Barbiturates: NOT DETECTED
Benzodiazepines: NOT DETECTED
Cocaine: NOT DETECTED
Opiates: NOT DETECTED
Tetrahydrocannabinol: NOT DETECTED

## 2020-08-13 LAB — COMPREHENSIVE METABOLIC PANEL
ALT: 24 U/L (ref 0–44)
AST: 18 U/L (ref 15–41)
Albumin: 4.3 g/dL (ref 3.5–5.0)
Alkaline Phosphatase: 35 U/L — ABNORMAL LOW (ref 38–126)
Anion gap: 10 (ref 5–15)
BUN: 20 mg/dL (ref 6–20)
CO2: 21 mmol/L — ABNORMAL LOW (ref 22–32)
Calcium: 8.4 mg/dL — ABNORMAL LOW (ref 8.9–10.3)
Chloride: 110 mmol/L (ref 98–111)
Creatinine, Ser: 0.8 mg/dL (ref 0.61–1.24)
GFR calc Af Amer: >60 mL/min (ref >60)
GFR calc non Af Amer: >60 mL/min (ref >60)
Glucose, Bld: 97 mg/dL (ref 70–99)
Potassium: 3.8 mmol/L (ref 3.5–5.1)
Sodium: 141 mmol/L (ref 135–145)
Total Bilirubin: 0.5 mg/dL (ref 0.3–1.2)
Total Protein: 7.2 g/dL (ref 6.5–8.1)

## 2020-08-13 LAB — HIV ANTIBODY (ROUTINE TESTING W REFLEX): HIV Screen 4th Generation wRfx: NONREACTIVE

## 2020-08-13 LAB — PROTIME-INR
INR: 1.2 (ref 0.8–1.2)
Prothrombin Time: 14.3 seconds (ref 11.4–15.2)

## 2020-08-13 LAB — APTT: aPTT: 28 seconds (ref 24–36)

## 2020-08-13 LAB — SARS CORONAVIRUS 2 BY RT PCR (HOSPITAL ORDER, PERFORMED IN ~~LOC~~ HOSPITAL LAB): SARS Coronavirus 2: NEGATIVE

## 2020-08-13 LAB — TROPONIN I (HIGH SENSITIVITY)
Troponin I (High Sensitivity): <2 ng/L (ref <18)
Troponin I (High Sensitivity): <2 ng/L (ref <18)

## 2020-08-13 LAB — POC OCCULT BLOOD, ED: Fecal Occult Bld: POSITIVE — AB

## 2020-08-13 MED ORDER — FENTANYL CITRATE (PF) 100 MCG/2ML IJ SOLN
50.0000 ug | Freq: Once | INTRAMUSCULAR | Status: AC
  Administered 2020-08-13: 50 ug via INTRAVENOUS
  Filled 2020-08-13: qty 2

## 2020-08-13 MED ORDER — ALBUTEROL SULFATE HFA 108 (90 BASE) MCG/ACT IN AERS: 1.0000 | INHALATION_SPRAY | Freq: Four times a day (QID) | RESPIRATORY_TRACT | Status: DC | PRN

## 2020-08-13 MED ORDER — SACUBITRIL-VALSARTAN 49-51 MG PO TABS
1.0000 | ORAL_TABLET | Freq: Two times a day (BID) | ORAL | Status: DC
  Administered 2020-08-13 – 2020-08-14 (×2): 1 via ORAL
  Filled 2020-08-13 (×2): qty 1

## 2020-08-13 MED ORDER — SPIRONOLACTONE 25 MG PO TABS
25.0000 mg | ORAL_TABLET | Freq: Every day | ORAL | Status: DC
  Administered 2020-08-13 – 2020-08-14 (×2): 25 mg via ORAL
  Filled 2020-08-13 (×2): qty 1

## 2020-08-13 MED ORDER — IOHEXOL 300 MG/ML  SOLN
100.0000 mL | Freq: Once | INTRAMUSCULAR | Status: AC | PRN
  Administered 2020-08-13: 100 mL via INTRAVENOUS

## 2020-08-13 MED ORDER — ONDANSETRON 4 MG PO TBDP: 4.0000 mg | ORAL_TABLET | Freq: Four times a day (QID) | ORAL | Status: DC | PRN

## 2020-08-13 MED ORDER — SODIUM CHLORIDE (PF) 0.9 % IJ SOLN
INTRAMUSCULAR | Status: AC
  Filled 2020-08-13: qty 50

## 2020-08-13 MED ORDER — OXYMETAZOLINE HCL 0.05 % NA SOLN
1.0000 | Freq: Two times a day (BID) | NASAL | Status: DC
  Administered 2020-08-13 – 2020-08-14 (×2): 1 via NASAL
  Filled 2020-08-13: qty 30

## 2020-08-13 MED ORDER — POTASSIUM CHLORIDE IN NACL 20-0.9 MEQ/L-% IV SOLN
INTRAVENOUS | Status: DC
  Filled 2020-08-13 (×2): qty 1000

## 2020-08-13 MED ORDER — OXYCODONE HCL 5 MG PO TABS: 5.0000 mg | ORAL_TABLET | ORAL | Status: DC | PRN

## 2020-08-13 MED ORDER — CARVEDILOL 6.25 MG PO TABS
9.3750 mg | ORAL_TABLET | Freq: Two times a day (BID) | ORAL | Status: DC
  Administered 2020-08-13 – 2020-08-14 (×2): 9.375 mg via ORAL
  Filled 2020-08-13 (×2): qty 1

## 2020-08-13 MED ORDER — OXYMETAZOLINE HCL 0.05 % NA SOLN
1.0000 | Freq: Once | NASAL | Status: AC
  Administered 2020-08-13: 1 via NASAL
  Filled 2020-08-13: qty 30

## 2020-08-13 MED ORDER — HYDROMORPHONE HCL 1 MG/ML IJ SOLN: 1.0000 mg | INTRAMUSCULAR | Status: DC | PRN

## 2020-08-13 MED ORDER — ALBUTEROL SULFATE (2.5 MG/3ML) 0.083% IN NEBU: 2.5000 mg | INHALATION_SOLUTION | Freq: Four times a day (QID) | RESPIRATORY_TRACT | Status: DC | PRN

## 2020-08-13 MED ORDER — SODIUM CHLORIDE 0.9 % IV SOLN
80.0000 mg | Freq: Once | INTRAVENOUS | Status: AC
  Administered 2020-08-13: 80 mg via INTRAVENOUS
  Filled 2020-08-13: qty 80

## 2020-08-13 MED ORDER — ATORVASTATIN CALCIUM 80 MG PO TABS
80.0000 mg | ORAL_TABLET | Freq: Every day | ORAL | Status: DC
  Administered 2020-08-13 – 2020-08-14 (×2): 80 mg via ORAL
  Filled 2020-08-13 (×2): qty 1

## 2020-08-13 MED ORDER — LORATADINE 10 MG PO TABS: 10.0000 mg | ORAL_TABLET | Freq: Every day | ORAL | Status: DC | PRN

## 2020-08-13 MED ORDER — ONDANSETRON HCL 4 MG/2ML IJ SOLN: 4.0000 mg | Freq: Four times a day (QID) | INTRAMUSCULAR | Status: DC | PRN

## 2020-08-13 MED ORDER — OXYCODONE HCL 5 MG PO TABS: 10.0000 mg | ORAL_TABLET | ORAL | Status: DC | PRN

## 2020-08-13 MED ORDER — TRAMADOL HCL 50 MG PO TABS: 50.0000 mg | ORAL_TABLET | Freq: Four times a day (QID) | ORAL | Status: DC | PRN
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
## 2020-08-14 ENCOUNTER — Encounter (HOSPITAL_COMMUNITY): Payer: Self-pay | Admitting: Surgery

## 2020-08-14 ENCOUNTER — Inpatient Hospital Stay (HOSPITAL_COMMUNITY): Payer: No Typology Code available for payment source

## 2020-08-14 DIAGNOSIS — S022XXA Fracture of nasal bones, initial encounter for closed fracture: Secondary | ICD-10-CM | POA: Diagnosis not present

## 2020-08-14 LAB — BASIC METABOLIC PANEL
Anion gap: 7 (ref 5–15)
BUN: 22 mg/dL — ABNORMAL HIGH (ref 6–20)
CO2: 25 mmol/L (ref 22–32)
Calcium: 8.5 mg/dL — ABNORMAL LOW (ref 8.9–10.3)
Chloride: 104 mmol/L (ref 98–111)
Creatinine, Ser: 0.85 mg/dL (ref 0.61–1.24)
GFR calc Af Amer: >60 mL/min (ref >60)
GFR calc non Af Amer: >60 mL/min (ref >60)
Glucose, Bld: 111 mg/dL — ABNORMAL HIGH (ref 70–99)
Potassium: 3.8 mmol/L (ref 3.5–5.1)
Sodium: 136 mmol/L (ref 135–145)

## 2020-08-14 LAB — CBC
HCT: 30.5 % — ABNORMAL LOW (ref 39.0–52.0)
Hemoglobin: 10.4 g/dL — ABNORMAL LOW (ref 13.0–17.0)
MCH: 30.5 pg (ref 26.0–34.0)
MCHC: 34.1 g/dL (ref 30.0–36.0)
MCV: 89.4 fL (ref 80.0–100.0)
Platelets: 271 10*3/uL (ref 150–400)
RBC: 3.41 MIL/uL — ABNORMAL LOW (ref 4.22–5.81)
RDW: 12.7 % (ref 11.5–15.5)
WBC: 10 10*3/uL (ref 4.0–10.5)
nRBC: 0 % (ref 0.0–0.2)

## 2020-08-14 MED ORDER — OXYMETAZOLINE HCL 0.05 % NA SOLN: 1.0000 | Freq: Two times a day (BID) | NASAL | 0 refills | Status: DC

## 2020-08-14 MED ORDER — OXYCODONE HCL 5 MG PO TABS: 5.0000 mg | ORAL_TABLET | ORAL | Status: DC | PRN

## 2020-08-14 MED ORDER — ACETAMINOPHEN 500 MG PO TABS: 500.0000 mg | ORAL_TABLET | Freq: Four times a day (QID) | ORAL | Status: DC | PRN

## 2020-08-14 MED ORDER — TRAMADOL HCL 50 MG PO TABS: 50.0000 mg | ORAL_TABLET | Freq: Four times a day (QID) | ORAL | Status: DC | PRN

## 2020-08-14 MED ORDER — HYDROMORPHONE HCL 1 MG/ML IJ SOLN: 1.0000 mg | INTRAMUSCULAR | Status: DC | PRN

## 2020-08-14 MED ORDER — IBUPROFEN 200 MG PO TABS
400.0000 mg | ORAL_TABLET | Freq: Four times a day (QID) | ORAL | Status: DC | PRN
Start: 2020-08-14 — End: 2021-06-25

## 2020-08-14 NOTE — Discharge Instructions (Signed)
Remove nasal packing in 24-48 hrs, moisten with saline or water prior to removal. Hold Plavix another 48 hrs prior to resuming.   Nasal Fracture A fracture is a break in a bone. A nasal fracture is a broken nose. Minor breaks do not need treatment. They often heal on their own in about one month. Serious breaks may need treatment. Sometimes surgery is needed. What are the causes? This condition is usually caused by a direct hit to the nose (blunt injury). This often occurs from:  Playing a contact sport.  Being in a car accident.  Falling.  Getting punched. What are the signs or symptoms?  Pain.  Swelling of the nose.  Bleeding from the nose.  Bruising around the nose or bruising around the eyes (black eyes).  The nose having a crooked shape. How is this treated? Treatment depends on how bad the injury is.  Minor breaks often do not need treatment.  For more serious breaks that have caused bones to move out of position, treatment may involve one of these: ? Moving the bones back into position without surgery. Your doctor may be able to do this in his or her office after you are given medicine to numb the nose area (local anesthetic). ? Surgery. If needed, this will be done after the swelling is gone. Follow these instructions at home: Activity  Return to your normal activities as told by your doctor. Ask your doctor what activities are safe for you.  Do not play contact sports for 3-4 weeks or as told by your doctor. General instructions      If told, put ice on the injured area: ? Put ice in a plastic bag. ? Place a towel between your skin and the bag. ? Leave the ice on for 20 minutes, 2-3 times a day.  Take over-the-counter and prescription medicines only as told by your doctor.  If your nose bleeds, sit up while you gently squeeze your nose shut for 10 minutes.  Try to not blow your nose.  Keep all follow-up visits as told by your doctor. This is  important. Contact a doctor if:  You have more pain or very bad pain.  You keep having nosebleeds.  The shape of your nose does not return to normal after 5 days.  You have pus coming out of your nose. Get help right away if:  Your nose bleeds for more than 20 minutes.  You have clear fluid draining out of your nose.  You have a swelling on the inside of your nose that does not get better.  You have trouble moving your eyes.  You keep throwing up (vomiting). Summary  A nasal fracture is a broken nose.  Symptoms include pain, swelling, and bruising.  Minor breaks often do not require treatment. More serious breaks may require surgery or other treatments.  If your nose bleeds, sit up while you gently squeeze your nose shut for 10 minutes. This information is not intended to replace advice given to you by your health care provider. Make sure you discuss any questions you have with your health care provider. Document Revised: 05/19/2018 Document Reviewed: 05/19/2018 Elsevier Patient Education  2020 [employer].

## 2020-08-14 NOTE — Plan of Care (Signed)
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
## 2020-08-14 NOTE — Progress Notes (Signed)
Discharge instructions reviewed with pt and instructed on where to pick up prescription.  Pt verbalized understanding and had no questions.  Pt discharged in stable condition.  Wrenn Willcox Lindsay   

## 2020-08-17 ENCOUNTER — Encounter (HOSPITAL_COMMUNITY): Payer: Medicaid Other | Admitting: Internal Medicine

## 2020-08-30 ENCOUNTER — Other Ambulatory Visit (HOSPITAL_COMMUNITY): Payer: Self-pay | Admitting: Internal Medicine

## 2020-08-30 MED FILL — CARVEDILOL 6.25 MG TABLET: 6.25 | 30 days supply | Qty: 90 | Fill #4

## 2020-08-30 MED FILL — CLOPIDOGREL 75 MG TABLET: 75 | 90 days supply | Qty: 90 | Fill #0

## 2020-08-30 MED FILL — ATORVASTATIN 80 MG TABLET: 80 | 30 days supply | Qty: 30 | Fill #3

## 2020-08-30 MED FILL — SPIRONOLACTONE 25 MG TABS: 25 | 30 days supply | Qty: 30 | Fill #0

## 2020-08-31 ENCOUNTER — Other Ambulatory Visit (HOSPITAL_COMMUNITY): Payer: Self-pay | Admitting: Internal Medicine

## 2020-08-31 ENCOUNTER — Other Ambulatory Visit (HOSPITAL_COMMUNITY): Payer: Self-pay | Admitting: *Deleted

## 2020-08-31 MED ORDER — SPIRONOLACTONE 25 MG PO TABS: 25.0000 mg | ORAL_TABLET | Freq: Every day | ORAL | 3 refills | Status: DC

## 2020-09-19 ENCOUNTER — Ambulatory Visit (INDEPENDENT_AMBULATORY_CARE_PROVIDER_SITE_OTHER): Payer: Medicaid Other | Admitting: *Deleted

## 2020-09-19 DIAGNOSIS — I255 Ischemic cardiomyopathy: Secondary | ICD-10-CM | POA: Diagnosis not present

## 2020-09-22 LAB — CUP PACEART REMOTE DEVICE CHECK
Battery Remaining Percentage: 83 %
Date Time Interrogation Session: 20210923001000
Implantable Lead Implant Date: 20200219
Implantable Lead Location: 753862
Implantable Lead Model: 3501
Implantable Lead Serial Number: 168743
Implantable Pulse Generator Implant Date: 20200219
Pulse Gen Serial Number: 257847

## 2020-09-22 NOTE — Progress Notes (Signed)
Remote ICD transmission.   

## 2020-09-29 MED FILL — SPIRONOLACTONE 25 MG TABS: 25 | 30 days supply | Qty: 30 | Fill #1

## 2020-10-16 ENCOUNTER — Other Ambulatory Visit (HOSPITAL_COMMUNITY): Payer: Self-pay | Admitting: Internal Medicine

## 2020-10-17 ENCOUNTER — Encounter (HOSPITAL_COMMUNITY): Payer: Medicaid Other | Admitting: Internal Medicine

## 2020-10-17 MED FILL — ATORVASTATIN 80 MG TABLET: 80 | 30 days supply | Qty: 30 | Fill #0

## 2020-10-22 NOTE — Progress Notes (Signed)
PCP: Primary HF Cardiologist: Dr Gala Romney   Advanced HF Clinic Note   HPI: Sergio Hicks is a 39 y.o. 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   Admitted 01/18/2018 with OOH anterior MI and severe ICM. Taken urgently for cath which showed 100% LAD occlusion as below. ECHO1/20/19 LVEF 30%. Initially treated medically but developed persistent CP so taken for PCI and pt is s/p DES to proximal/mid LAD 01/20/18.  S/p SQ [employer] ICD with Dr Ladona Ridgel on 02/17/19.  Echo 01/28/20 EF 35-40% RV normal. Personally reviewed and I felt 30-35%  He returns today for regular follow up. Doing pretty well. Working PT at [employer]. Going to gym 4x/week. Doing cardio and weights. Does 35-45 mins of cardio with elliptical, bike and TM. No CP or SOB. No edema, orthopnea or PND. No problems with meds. Was having some dizziness and orthostasis in August but now resolved. No ICD firing.  Echo 07/2018: EF 25-30% Echo 4/19 EF 30-35%.  ECHO 01/18/2018 EF 30-35%   Review of systems complete and found to be negative unless listed in HPI.   SH:  Social History   Socioeconomic History  . Marital status: Single    Spouse name: Not on file  . Number of children: Not on file  . Years of education: Not on file  . Highest education level: Not on file  Occupational History  . Occupation: chef  Tobacco Use  . Smoking status: Former Smoker    Packs/day: 0.00    Types: Cigarettes  . Smokeless tobacco: Never Used  . Tobacco comment: 2019  Vaping Use  . Vaping Use: Never used  Substance and Sexual Activity  . Alcohol use: Yes    Alcohol/week: 1.0 standard drink    Types: 1 Glasses of wine per week    Comment: 1-2 days a week   . Drug use: Never  . Sexual activity: Not on file  Other Topics Concern  . Not on file  Social History Narrative  . Not on file   Social Determinants of  Health   Financial Resource Strain:   . Difficulty of Paying Living Expenses: Not on file  Food Insecurity:   . Worried About Programme researcher, broadcasting/film/video in the Last Year: Not on file  . Ran Out of Food in the Last Year: Not on file  Transportation Needs:   . Lack of Transportation (Medical): Not on file  . Lack of Transportation (Non-Medical): Not on file  Physical Activity:   . Days of Exercise per Week: Not on file  . Minutes of Exercise per Session: Not on file  Stress:   . Feeling of Stress : Not on file  Social Connections:   . Frequency of Communication with Friends and Family: Not on file  . Frequency of Social Gatherings with Friends and Family: Not on file  . Attends Religious Services: Not on file  . Active Member of Clubs or Organizations: Not on file  . Attends Banker Meetings: Not on file  . Marital Status: Not on file  Intimate Partner Violence:   . Fear of Current or Ex-Partner: Not on file  . Emotionally Abused: Not on file  . Physically Abused: Not on file  . Sexually Abused: Not on file   FH:  Family History  Adopted: Yes   Past Medical History:  Diagnosis Date  .  Asthma   . CHF (congestive heart failure) (HCC) 12/2016  . Coronary artery disease   . Myocardial infarction [facility])    Current Outpatient Medications  Medication Sig Dispense Refill  . albuterol (PROVENTIL HFA;VENTOLIN HFA) 108 (90 Base) MCG/ACT inhaler Inhale 1-2 puffs into the lungs every 6 (six) hours as needed for wheezing or shortness of breath.    Marland Kitchen aspirin EC 81 MG EC tablet Take 1 tablet (81 mg total) by mouth daily. 30 tablet 6  . atorvastatin (LIPITOR) 80 MG tablet TAKE 1 TABLET BY MOUTH DAILY 30 tablet 0  . carvedilol (COREG) 6.25 MG tablet Take 1.5 tablets (9.375 mg total) by mouth 2 (two) times daily. 90 tablet 3  . cholecalciferol (VITAMIN D3) 25 MCG (1000 UT) tablet Take 1,000 Units by mouth daily.    . clopidogrel (PLAVIX) 75 MG tablet TAKE 1 TABLET (75 MG TOTAL) BY MOUTH  DAILY WITH BREAKFAST. 90 tablet 3  . ENTRESTO 49-51 MG TAKE 1 TABLET BY MOUTH 2 (TWO) TIMES DAILY. (Patient taking differently: Take 1 tablet by mouth 2 (two) times daily. ) 180 tablet 3  . furosemide (LASIX) 40 MG tablet Take 1 tablet (40 mg total) by mouth as needed. (Patient taking differently: Take 40 mg by mouth as needed for fluid or edema. ) 30 tablet 3  . ibuprofen (MOTRIN IB) 200 MG tablet Take 2 tablets (400 mg total) by mouth every 6 (six) hours as needed for headache or mild pain.    Marland Kitchen loratadine (CLARITIN) 10 MG tablet Take 10 mg by mouth daily as needed for allergies.    . nitroGLYCERIN (NITROSTAT) 0.4 MG SL tablet Place 1 tablet (0.4 mg total) under the tongue every 5 (five) minutes as needed for chest pain. 60 tablet 0  . oxymetazoline (AFRIN) 0.05 % nasal spray Place 1 spray into both nostrils 2 (two) times daily. 30 mL 0  . potassium chloride SA (KLOR-CON) 20 MEQ tablet Take 2 tablets (40 mEq total) by mouth daily as needed (when taking furosemide). (Patient taking differently: Take 40 mEq by mouth daily as needed (when taking furosemide for potassium replacement). ) 30 tablet 3  . spironolactone (ALDACTONE) 25 MG tablet Take 1 tablet (25 mg total) by mouth daily. 30 tablet 3   No current facility-administered medications for this encounter.   Vitals:   10/23/20 0955  BP: 124/70  Pulse: 63  SpO2: 98%  Weight: 135.2 kg (298 lb)   Wt Readings from Last 3 Encounters:  08/13/20 129.7 kg (286 lb)  03/15/20 134.3 kg (296 lb)  01/31/20 132 kg (291 lb)    PHYSICAL EXAM: General:  Well appearing. No resp difficulty HEENT: normal Neck: supple. no JVD. Carotids 2+ bilat; no bruits. No lymphadenopathy or thryomegaly appreciated. Cor: PMI nondisplaced. Regular rate & rhythm. No rubs, gallops or murmurs. Lungs: clear Abdomen: obese soft, nontender, nondistended. No hepatosplenomegaly. No bruits or masses. Good bowel sounds. Extremities: no cyanosis, clubbing, rash, edema Neuro:  alert & orientedx3, cranial nerves grossly intact. moves all 4 extremities w/o difficulty. Affect pleasant   ASSESSMENT & PLAN: 1. 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  - Echo EF 4/19 30-35%.  - Echo 8/19 EF 25-30%  - Echo 1/21: EF 35-40% RV ok. (I thought 30-35%) - S/p SQ [employer] ICD 12/18/19 - Stable NYHA I-II - Volume status looks good - Continue lasix prn. Doesn't need it.  - Continue Entresto 49/51 mg BID. - Continue Carvedilol to 9.375/6.25 HR low  to mid 60s. Was unable to tolerate 9.375 bid due to dizziness. - Continue spironolactone 25 mg daily - Start Farxiga 10   2. CAD- out of hospital anterior MI with late presentation - cath 1/20 with total occlusion of LAD with R to L collaterals.  - 01/20/19 S/P DES to proximal/mid LAD. - No s/s ischemia - Continue high dose statin, aspirin, and plavix. - Continue Lipitor 80 mg daily.  - Check lipids today. Goal LDL < 70   Arvilla Meres, MD  7:55 PM

## 2020-10-23 ENCOUNTER — Other Ambulatory Visit: Payer: Self-pay

## 2020-10-23 ENCOUNTER — Encounter (HOSPITAL_COMMUNITY): Payer: Self-pay | Admitting: Internal Medicine

## 2020-10-23 ENCOUNTER — Other Ambulatory Visit (HOSPITAL_COMMUNITY): Payer: Self-pay | Admitting: Internal Medicine

## 2020-10-23 ENCOUNTER — Telehealth (HOSPITAL_COMMUNITY): Payer: Self-pay | Admitting: Pharmacy Technician

## 2020-10-23 ENCOUNTER — Ambulatory Visit (HOSPITAL_COMMUNITY)
Admission: RE | Admit: 2020-10-23 | Discharge: 2020-10-23 | Disposition: A | Discharge status: Home / Self Care | Payer: Medicaid Other | Source: Ambulatory Visit | Attending: Internal Medicine | Admitting: Internal Medicine

## 2020-10-23 VITALS — BP 124/70 | HR 63 | Wt 298.0 lb

## 2020-10-23 DIAGNOSIS — I252 Old myocardial infarction: Secondary | ICD-10-CM | POA: Diagnosis not present

## 2020-10-23 DIAGNOSIS — I5022 Chronic systolic (congestive) heart failure: Secondary | ICD-10-CM | POA: Diagnosis present

## 2020-10-23 DIAGNOSIS — Z7902 Long term (current) use of antithrombotics/antiplatelets: Secondary | ICD-10-CM | POA: Insufficient documentation

## 2020-10-23 DIAGNOSIS — Z79899 Other long term (current) drug therapy: Secondary | ICD-10-CM | POA: Diagnosis not present

## 2020-10-23 DIAGNOSIS — E783 Hyperchylomicronemia: Secondary | ICD-10-CM | POA: Insufficient documentation

## 2020-10-23 DIAGNOSIS — Z955 Presence of coronary angioplasty implant and graft: Secondary | ICD-10-CM | POA: Diagnosis not present

## 2020-10-23 DIAGNOSIS — Z7982 Long term (current) use of aspirin: Secondary | ICD-10-CM | POA: Diagnosis not present

## 2020-10-23 DIAGNOSIS — Z87891 Personal history of nicotine dependence: Secondary | ICD-10-CM | POA: Diagnosis not present

## 2020-10-23 DIAGNOSIS — I2510 Atherosclerotic heart disease of native coronary artery without angina pectoris: Secondary | ICD-10-CM | POA: Diagnosis not present

## 2020-10-23 LAB — COMPREHENSIVE METABOLIC PANEL
ALT: 22 U/L (ref 0–44)
AST: 18 U/L (ref 15–41)
Albumin: 3.9 g/dL (ref 3.5–5.0)
Alkaline Phosphatase: 35 U/L — ABNORMAL LOW (ref 38–126)
Anion gap: 9 (ref 5–15)
BUN: 15 mg/dL (ref 6–20)
CO2: 22 mmol/L (ref 22–32)
Calcium: 9.1 mg/dL (ref 8.9–10.3)
Chloride: 105 mmol/L (ref 98–111)
Creatinine, Ser: 0.84 mg/dL (ref 0.61–1.24)
GFR, Estimated: >60 mL/min (ref >60)
Glucose, Bld: 103 mg/dL — ABNORMAL HIGH (ref 70–99)
Potassium: 4.1 mmol/L (ref 3.5–5.1)
Sodium: 136 mmol/L (ref 135–145)
Total Bilirubin: 1 mg/dL (ref 0.3–1.2)
Total Protein: 6.9 g/dL (ref 6.5–8.1)

## 2020-10-23 LAB — LIPID PANEL
Cholesterol: 120 mg/dL (ref 0–200)
HDL: 35 mg/dL — ABNORMAL LOW (ref >40)
LDL Cholesterol: 69 mg/dL (ref 0–99)
Total CHOL/HDL Ratio: 3.4 RATIO
Triglycerides: 80 mg/dL (ref <150)
VLDL: 16 mg/dL (ref 0–40)

## 2020-10-23 LAB — BRAIN NATRIURETIC PEPTIDE: B Natriuretic Peptide: 61.9 pg/mL (ref 0.0–100.0)

## 2020-10-23 MED ORDER — DAPAGLIFLOZIN PROPANEDIOL 10 MG PO TABS: 10.0000 mg | ORAL_TABLET | Freq: Every day | ORAL | 6 refills | Status: DC

## 2020-10-23 MED FILL — FARXIGA 10 MG TABLET: 10 | 30 days supply | Qty: 30 | Fill #0

## 2020-10-23 NOTE — Addendum Note (Signed)
Encounter addended by: Noralee Space, RN on: 10/23/2020 10:27 AM  Actions taken: Order list changed, Diagnosis association updated, Clinical Note Signed, Charge Capture section accepted

## 2020-10-23 NOTE — Patient Instructions (Signed)
Start Farxiga 10 mg Daily  Labs done today, your results will be available in MyChart, we will contact you for abnormal readings.  Your physician recommends that you schedule a follow-up appointment in: 4 months  If you have any questions or concerns before your next appointment please send [place] a message through [place] or call our office at [phone].    TO LEAVE A MESSAGE FOR THE NURSE SELECT OPTION 2, PLEASE LEAVE A MESSAGE INCLUDING: . YOUR NAME . DATE OF BIRTH . CALL BACK NUMBER . REASON FOR CALL**this is important as we prioritize the call backs  YOU WILL RECEIVE A CALL BACK THE SAME DAY AS LONG AS YOU CALL BEFORE 4:00 PM  At the [facility], you and your health needs are our priority. As part of our continuing mission to provide you with exceptional heart care, we have created designated Provider Care Teams. These Care Teams include your primary Cardiologist (physician) and Advanced Practice Providers (APPs- Physician Assistants and Nurse Practitioners) who all work together to provide you with the care you need, when you need it.   You may see any of the following providers on your designated Care Team at your next follow up: Marland Kitchen Dr Arvilla Meres . Dr Marca Ancona . Tonye Becket, NP . Robbie Lis, PA . Karle Plumber, PharmD   Please be sure to bring in all your medications bottles to every appointment.

## 2020-10-23 NOTE — Telephone Encounter (Signed)
Patient Advocate Encounter   Received notification from [facility] that prior authorization for Sergio Hicks is required.   PA submitted on CoverMyMeds Key [id number] Status is pending   Will continue to follow.

## 2020-10-23 NOTE — Telephone Encounter (Signed)
Advanced Heart Failure Patient Advocate Encounter  Prior Authorization for Marcelline Deist has been approved.    Effective dates: 10/23/20 through 10/23/21  Strehl Asa, CPhT

## 2020-10-30 MED FILL — CARVEDILOL 6.25 MG TABLET: 6.25 | 30 days supply | Qty: 90 | Fill #5

## 2020-10-30 MED FILL — SPIRONOLACTONE 25 MG TABS: 25 | 30 days supply | Qty: 30 | Fill #2

## 2020-10-30 MED FILL — ENTRESTO 49 MG-51 MG TABLET: 49-51 | 90 days supply | Qty: 180 | Fill #1

## 2020-11-18 ENCOUNTER — Other Ambulatory Visit (HOSPITAL_COMMUNITY): Payer: Self-pay | Admitting: Internal Medicine

## 2020-11-18 MED FILL — FARXIGA 10 MG TABLET: 10 | 30 days supply | Qty: 30 | Fill #1

## 2020-11-21 ENCOUNTER — Other Ambulatory Visit (HOSPITAL_COMMUNITY): Payer: Self-pay | Admitting: Internal Medicine

## 2020-11-21 MED FILL — ATORVASTATIN 80 MG TABLET: 80 | 30 days supply | Qty: 30 | Fill #0

## 2020-12-01 MED FILL — SPIRONOLACTONE 25 MG TABS: 25 | 30 days supply | Qty: 30 | Fill #3

## 2020-12-01 MED FILL — CLOPIDOGREL 75 MG TABLET: 75 | 90 days supply | Qty: 90 | Fill #1

## 2020-12-01 MED FILL — CARVEDILOL 6.25 MG TABLET: 6.25 | 30 days supply | Qty: 90 | Fill #6

## 2020-12-07 ENCOUNTER — Encounter (HOSPITAL_COMMUNITY): Payer: Self-pay

## 2020-12-15 ENCOUNTER — Other Ambulatory Visit (HOSPITAL_COMMUNITY): Payer: Self-pay | Admitting: *Deleted

## 2020-12-15 ENCOUNTER — Encounter (HOSPITAL_COMMUNITY): Payer: Self-pay | Admitting: *Deleted

## 2020-12-15 DIAGNOSIS — R0683 Snoring: Secondary | ICD-10-CM

## 2020-12-19 ENCOUNTER — Ambulatory Visit (INDEPENDENT_AMBULATORY_CARE_PROVIDER_SITE_OTHER): Payer: Medicaid Other

## 2020-12-19 DIAGNOSIS — I255 Ischemic cardiomyopathy: Secondary | ICD-10-CM

## 2020-12-19 LAB — CUP PACEART REMOTE DEVICE CHECK
Battery Remaining Percentage: 80 %
Date Time Interrogation Session: 20211221085400
Implantable Lead Implant Date: 20200219
Implantable Lead Location: 753862
Implantable Lead Model: 3501
Implantable Lead Serial Number: 168743
Implantable Pulse Generator Implant Date: 20200219
Pulse Gen Serial Number: 257847

## 2020-12-25 MED FILL — SPIRONOLACTONE 25 MG TABS: 25 | 30 days supply | Qty: 30 | Fill #0

## 2020-12-25 MED FILL — FARXIGA 10 MG TABLET: 10 | 30 days supply | Qty: 30 | Fill #2

## 2020-12-25 MED FILL — ATORVASTATIN 80 MG TABLET: 80 | 30 days supply | Qty: 30 | Fill #1

## 2020-12-28 ENCOUNTER — Encounter (HOSPITAL_COMMUNITY): Payer: Self-pay

## 2021-01-02 NOTE — Progress Notes (Signed)
Remote ICD transmission.   

## 2021-01-24 ENCOUNTER — Other Ambulatory Visit (HOSPITAL_COMMUNITY): Payer: Self-pay | Admitting: Internal Medicine

## 2021-01-24 ENCOUNTER — Other Ambulatory Visit (HOSPITAL_COMMUNITY): Payer: Self-pay

## 2021-01-24 MED ORDER — CARVEDILOL 6.25 MG PO TABS: 9.3750 mg | ORAL_TABLET | Freq: Two times a day (BID) | ORAL | 3 refills | Status: DC

## 2021-01-24 MED FILL — FARXIGA 10 MG TABLET: 10 | 30 days supply | Qty: 30 | Fill #3

## 2021-01-24 MED FILL — ATORVASTATIN 80 MG TABLET: 80 | 30 days supply | Qty: 30 | Fill #2

## 2021-01-24 MED FILL — CARVEDILOL 6.25 MG TABLET: 6.25 | 30 days supply | Qty: 90 | Fill #0

## 2021-01-24 MED FILL — ENTRESTO 49 MG-51 MG TABLET: 49-51 | 90 days supply | Qty: 180 | Fill #2

## 2021-01-24 MED FILL — SPIRONOLACTONE 25 MG TABS: 25 | 30 days supply | Qty: 30 | Fill #1

## 2021-02-06 ENCOUNTER — Encounter (HOSPITAL_COMMUNITY): Payer: Self-pay

## 2021-02-13 ENCOUNTER — Telehealth (HOSPITAL_COMMUNITY): Payer: Self-pay | Admitting: Vascular Surgery

## 2021-02-13 NOTE — Telephone Encounter (Signed)
Left pt detailed message giving sleep study appt, asked pt to call back to confirm

## 2021-02-17 NOTE — Progress Notes (Signed)
Advanced HF Clinic Note  PCP: [facility] EP: Dr. Ladona Ridgel HF Cardiologist: Dr. Gala Romney  HPI: Sergio Hicks is a 40 y.o. 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   Admitted 01/18/2018 with OOH anterior MI and severe ICM. Taken urgently for cath which showed 100% LAD occlusion as below. ECHO1/20/19 LVEF 30%. Initially treated medically but developed persistent CP so taken for PCI and pt is s/p DES to proximal/mid LAD 01/20/18.  S/p SQ [employer] ICD with Dr. Ladona Ridgel on 02/17/19.  Echo 01/28/20 EF 35-40% RV normal. Personally reviewed and I felt 30-35%.  Today he returns for HF follow up. Last visit 10/21 doing well, working part time, gym 4x/week, Marcelline Deist was started. Now, overall feeling fatigued, had COVID 12/21, still feels he is "off". 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 Some CP/"twinges" intermittently, occurs randomly, resolves quickly without intervention. Denies increasing SOB, CP, dizziness, edema, or PND/Orthopnea. Appetite ok. No fever or chills. Weight at home ~294 pounds. Taking all medications. Has not needed to take lasix recently. Device interrogated last week with EP, no issues.    Cardiac Studies: Echo 07/2018: EF 25-30% Echo 4/19: EF 30-35%.  Echo 01/18/2018: EF 30-35%   Review of systems complete and found to be negative unless listed in HPI.   SH:  Social History   Socioeconomic History  . Marital status: Single    Spouse name: Not on file  . Number of children: Not on file  . Years of education: Not on file  . Highest education level: Not on file  Occupational History  . Occupation: chef  Tobacco Use  . Smoking status: Former Smoker    Packs/day: 0.00    Types: Cigarettes  . Smokeless tobacco: Never Used  . Tobacco comment: 2019  Vaping Use  . Vaping Use:  Never used  Substance and Sexual Activity  . Alcohol use: Yes    Alcohol/week: 1.0 standard drink    Types: 1 Glasses of wine per week    Comment: 1-2 days a week   . Drug use: Never  . Sexual activity: Not on file  Other Topics Concern  . Not on file  Social History Narrative  . Not on file   Social Determinants of Health   Financial Resource Strain: Not on file  Food Insecurity: Not on file  Transportation Needs: Not on file  Physical Activity: Not on file  Stress: Not on file  Social Connections: Not on file  Intimate Partner Violence: Not on file   FH:  Family History  Adopted: Yes   Past Medical History:  Diagnosis Date  . Asthma   . CHF (congestive heart failure) (HCC) 12/2016  . Coronary artery disease   . Myocardial infarction [facility])    Current Outpatient Medications  Medication Sig Dispense Refill  . albuterol (PROVENTIL HFA;VENTOLIN HFA) 108 (90 Base) MCG/ACT inhaler Inhale 1-2 puffs into the lungs every 6 (six) hours as needed for wheezing or shortness of breath.    Marland Kitchen aspirin EC 81 MG EC tablet Take 1 tablet (81 mg total) by mouth daily. 30 tablet 6  . atorvastatin (LIPITOR) 80 MG tablet TAKE 1 TABLET BY MOUTH DAILY 30 tablet 11  . carvedilol (COREG) 6.25 MG tablet Take 1.5 tablets (9.375 mg total) by mouth 2 (two) times  daily. 90 tablet 3  . clopidogrel (PLAVIX) 75 MG tablet TAKE 1 TABLET (75 MG TOTAL) BY MOUTH DAILY WITH BREAKFAST. 90 tablet 3  . dapagliflozin propanediol (FARXIGA) 10 MG TABS tablet Take 1 tablet (10 mg total) by mouth daily before breakfast. 30 tablet 6  . ENTRESTO 49-51 MG TAKE 1 TABLET BY MOUTH 2 (TWO) TIMES DAILY. 180 tablet 3  . furosemide (LASIX) 40 MG tablet Take 1 tablet (40 mg total) by mouth as needed. 30 tablet 3  . ibuprofen (MOTRIN IB) 200 MG tablet Take 2 tablets (400 mg total) by mouth every 6 (six) hours as needed for headache or mild pain.    Marland Kitchen loratadine (CLARITIN) 10 MG tablet Take 10 mg by mouth daily as needed for  allergies.    . Multiple Vitamin (MULTIVITAMIN) tablet Take 1 tablet by mouth daily.    . nitroGLYCERIN (NITROSTAT) 0.4 MG SL tablet Place 1 tablet (0.4 mg total) under the tongue every 5 (five) minutes as needed for chest pain. 60 tablet 0  . potassium chloride SA (KLOR-CON) 20 MEQ tablet Take 2 tablets (40 mEq total) by mouth daily as needed (when taking furosemide). 30 tablet 3  . spironolactone (ALDACTONE) 25 MG tablet Take 1 tablet (25 mg total) by mouth daily. 30 tablet 3  . vitamin B-12 (CYANOCOBALAMIN) 100 MCG tablet Take 500 mcg by mouth daily.     No current facility-administered medications for this encounter.   Vitals:   02/19/21 0945  BP: 102/80  Pulse: 65  SpO2: 98%  Weight: (!) 137.9 kg (304 lb)   Wt Readings from Last 3 Encounters:  02/19/21 (!) 137.9 kg (304 lb)  10/23/20 135.2 kg (298 lb)  08/13/20 129.7 kg (286 lb)    PHYSICAL EXAM: General:  NAD. No resp difficulty HEENT: Normal Neck: Supple. No JVD. Carotids 2+ bilat; no bruits. No lymphadenopathy or thryomegaly appreciated. Cor: PMI nondisplaced. Regular rate & rhythm. No rubs, gallops or murmurs. Lungs: Clear Abdomen: Obese, soft, nontender, nondistended. No hepatosplenomegaly. No bruits or masses. Good bowel sounds. Extremities: No cyanosis, clubbing, rash, edema Neuro: alert & oriented x 3, cranial nerves grossly intact. Moves all 4 extremities w/o difficulty. Affect pleasant.  ECG: SR 66 bpm, slower than last tracing (personally reviewed).  ASSESSMENT & PLAN: 1. Chronic Systolic Heart Failure due to ICM - Echo (1/19): EF 30-35%.  - Echo (4/19): EF 4/19 30-35%.  - Echo (8/19): EF 25-30%  - Echo (1/21): EF 35-40% RV ok. (I thought 30-35%) - S/p SQ [employer] ICD 12/18/19. - Stable NYHA I-II, functional status confounded by recent COVID illness. Volume status good today, weight stable. - Continue lasix 40 mg prn. Does not need it daily. - Continue Entresto 49/51 mg bid. Will not increase today  with SBP 102 & increased fatigue. - Continue carvedilol  9.375 mg bid. - Continue spironolactone 25 mg daily. - Continue Farxiga 10 mg daily. - BMET today. - Repeat Echo.  2. CAD - cath 1/20 with total occlusion of LAD with R to L collaterals.  - 01/20/19 S/P DES to proximal/mid LAD. - No worrisome CP symptoms. - Continue high dose statin, aspirin, and plavix. - LDL 69 (10/21). Goal LDL < 70.  3. Obesity - Body mass index is 37.5 kg/m. - Encouraged continued physical activity in gym as able, and portion control.  Repeat echo and follow up with Dr. Gala Romney in 3-4 months.  Jacklynn Ganong, FNP-BC  5:33 PM

## 2021-02-19 ENCOUNTER — Other Ambulatory Visit: Payer: Self-pay

## 2021-02-19 ENCOUNTER — Encounter (HOSPITAL_COMMUNITY): Payer: Self-pay

## 2021-02-19 ENCOUNTER — Ambulatory Visit (HOSPITAL_COMMUNITY)
Admission: RE | Admit: 2021-02-19 | Discharge: 2021-02-19 | Disposition: A | Discharge status: Home / Self Care | Payer: Medicaid Other | Source: Ambulatory Visit | Attending: Family Medicine | Admitting: Family Medicine

## 2021-02-19 VITALS — BP 102/80 | HR 65 | Wt 304.0 lb

## 2021-02-19 DIAGNOSIS — Z79899 Other long term (current) drug therapy: Secondary | ICD-10-CM | POA: Diagnosis not present

## 2021-02-19 DIAGNOSIS — Z7902 Long term (current) use of antithrombotics/antiplatelets: Secondary | ICD-10-CM | POA: Insufficient documentation

## 2021-02-19 DIAGNOSIS — Z7982 Long term (current) use of aspirin: Secondary | ICD-10-CM | POA: Diagnosis not present

## 2021-02-19 DIAGNOSIS — Z955 Presence of coronary angioplasty implant and graft: Secondary | ICD-10-CM | POA: Insufficient documentation

## 2021-02-19 DIAGNOSIS — Z8616 Personal history of COVID-19: Secondary | ICD-10-CM | POA: Diagnosis not present

## 2021-02-19 DIAGNOSIS — Z87891 Personal history of nicotine dependence: Secondary | ICD-10-CM | POA: Insufficient documentation

## 2021-02-19 DIAGNOSIS — E669 Obesity, unspecified: Secondary | ICD-10-CM | POA: Diagnosis not present

## 2021-02-19 DIAGNOSIS — Z7984 Long term (current) use of oral hypoglycemic drugs: Secondary | ICD-10-CM | POA: Diagnosis not present

## 2021-02-19 DIAGNOSIS — I2510 Atherosclerotic heart disease of native coronary artery without angina pectoris: Secondary | ICD-10-CM | POA: Insufficient documentation

## 2021-02-19 DIAGNOSIS — I5022 Chronic systolic (congestive) heart failure: Secondary | ICD-10-CM | POA: Insufficient documentation

## 2021-02-19 DIAGNOSIS — Z6837 Body mass index (BMI) 37.0-37.9, adult: Secondary | ICD-10-CM | POA: Insufficient documentation

## 2021-02-19 LAB — BASIC METABOLIC PANEL
Anion gap: 8 (ref 5–15)
BUN: 10 mg/dL (ref 6–20)
CO2: 24 mmol/L (ref 22–32)
Calcium: 8.9 mg/dL (ref 8.9–10.3)
Chloride: 104 mmol/L (ref 98–111)
Creatinine, Ser: 0.89 mg/dL (ref 0.61–1.24)
GFR, Estimated: >60 mL/min (ref >60)
Glucose, Bld: 109 mg/dL — ABNORMAL HIGH (ref 70–99)
Potassium: 4.2 mmol/L (ref 3.5–5.1)
Sodium: 136 mmol/L (ref 135–145)

## 2021-02-19 NOTE — Patient Instructions (Signed)
It was great to see you today! No medication changes are needed at this time.  Labs today We will only contact you if something comes back abnormal or we need to make some changes. Otherwise no news is good news!  Your physician has requested that you have an echocardiogram. Echocardiography is a painless test that uses sound waves to create images of your heart. It provides your doctor with information about the size and shape of your heart and how well your heart's chambers and valves are working. This procedure takes approximately one hour. There are no restrictions for this procedure.  Your physician recommends that you schedule a follow-up appointment in: 3-4 months with Dr Gala Romney  Do the following things EVERYDAY: 1) Weigh yourself in the morning before breakfast. Write it down and keep it in a log. 2) Take your medicines as prescribed 3) Eat low salt foods--Limit salt (sodium) to 2000 mg per day.  4) Stay as active as you can everyday 5) Limit all fluids for the day to less than 2 liters  At the [facility], you and your health needs are our priority. As part of our continuing mission to provide you with exceptional heart care, we have created designated Provider Care Teams. These Care Teams include your primary Cardiologist (physician) and Advanced Practice Providers (APPs- Physician Assistants and Nurse Practitioners) who all work together to provide you with the care you need, when you need it.   You may see any of the following providers on your designated Care Team at your next follow up: Marland Kitchen Dr Arvilla Meres . Dr Marca Ancona . Dr Thornell Mule . Tonye Becket, NP . Robbie Lis, PA . Shanda Bumps [place],NP . Karle Plumber, PharmD   Please be sure to bring in all your medications bottles to every appointment.     If you have any questions or concerns before your next appointment please send [place] a message through [place] or call our office at  [phone].    TO LEAVE A MESSAGE FOR THE NURSE SELECT OPTION 2, PLEASE LEAVE A MESSAGE INCLUDING: . YOUR NAME . DATE OF BIRTH . CALL BACK NUMBER . REASON FOR CALL**this is important as we prioritize the call backs  YOU WILL RECEIVE A CALL BACK THE SAME DAY AS LONG AS YOU CALL BEFORE 4:00 PM

## 2021-03-02 MED FILL — FARXIGA 10 MG TABLET: 10 | 30 days supply | Qty: 30 | Fill #4

## 2021-03-02 MED FILL — SPIRONOLACTONE 25 MG TABS: 25 | 30 days supply | Qty: 30 | Fill #2

## 2021-03-02 MED FILL — CLOPIDOGREL 75 MG TABLET: 75 | 90 days supply | Qty: 90 | Fill #2

## 2021-03-02 MED FILL — ATORVASTATIN 80 MG TABLET: 80 | 30 days supply | Qty: 30 | Fill #3

## 2021-03-07 ENCOUNTER — Telehealth (HOSPITAL_COMMUNITY): Payer: Self-pay | Admitting: Pharmacy Technician

## 2021-03-07 NOTE — Telephone Encounter (Signed)
Received notification from [employer] that it is time for the patient to renew [place] assistance. The patient currently has a managed medicaid plan and is getting 90 days for $3.  No assistance needed at this time.  Malan Asa, CPhT

## 2021-03-12 ENCOUNTER — Encounter (HOSPITAL_COMMUNITY): Payer: Self-pay

## 2021-03-19 ENCOUNTER — Ambulatory Visit (HOSPITAL_COMMUNITY): Admission: RE | Admit: 2021-03-19 | Payer: Medicaid Other | Source: Ambulatory Visit

## 2021-03-20 ENCOUNTER — Ambulatory Visit (INDEPENDENT_AMBULATORY_CARE_PROVIDER_SITE_OTHER): Payer: Medicaid Other

## 2021-03-20 DIAGNOSIS — I255 Ischemic cardiomyopathy: Secondary | ICD-10-CM

## 2021-03-21 LAB — CUP PACEART REMOTE DEVICE CHECK
Battery Remaining Percentage: 77 %
Date Time Interrogation Session: 20220323001400
Implantable Lead Implant Date: 20200219
Implantable Lead Location: 753862
Implantable Lead Model: 3501
Implantable Lead Serial Number: 168743
Implantable Pulse Generator Implant Date: 20200219
Pulse Gen Serial Number: 257847

## 2021-03-27 NOTE — Progress Notes (Signed)
Remote ICD transmission.   

## 2021-03-31 ENCOUNTER — Ambulatory Visit (HOSPITAL_BASED_OUTPATIENT_CLINIC_OR_DEPARTMENT_OTHER): Payer: Medicaid Other | Attending: Internal Medicine | Admitting: Cardiology

## 2021-03-31 ENCOUNTER — Other Ambulatory Visit: Payer: Self-pay

## 2021-03-31 DIAGNOSIS — R0683 Snoring: Secondary | ICD-10-CM | POA: Insufficient documentation

## 2021-03-31 DIAGNOSIS — G4733 Obstructive sleep apnea (adult) (pediatric): Secondary | ICD-10-CM | POA: Diagnosis not present

## 2021-03-31 DIAGNOSIS — R0902 Hypoxemia: Secondary | ICD-10-CM | POA: Diagnosis not present

## 2021-04-03 ENCOUNTER — Encounter (HOSPITAL_COMMUNITY): Payer: Self-pay

## 2021-04-03 ENCOUNTER — Other Ambulatory Visit (HOSPITAL_COMMUNITY): Payer: Self-pay

## 2021-04-03 ENCOUNTER — Other Ambulatory Visit (HOSPITAL_COMMUNITY): Payer: Self-pay | Admitting: *Deleted

## 2021-04-03 DIAGNOSIS — I5022 Chronic systolic (congestive) heart failure: Secondary | ICD-10-CM

## 2021-04-03 MED ORDER — ATORVASTATIN CALCIUM 80 MG PO TABS
ORAL_TABLET | Freq: Every day | ORAL | 11 refills | Status: DC
  Filled 2021-04-03: qty 30, 30d supply, fill #0
  Filled 2021-05-15: qty 30, 30d supply, fill #1
  Filled 2021-06-22: qty 30, 30d supply, fill #2
  Filled 2021-08-17: qty 30, 30d supply, fill #3
  Filled 2021-09-17: qty 30, 30d supply, fill #4
  Filled 2021-11-27: qty 30, 30d supply, fill #5
  Filled 2021-12-27: qty 30, 30d supply, fill #6
  Filled 2022-03-04: qty 30, 30d supply, fill #7

## 2021-04-03 MED ORDER — SPIRONOLACTONE 25 MG PO TABS
ORAL_TABLET | Freq: Every day | ORAL | 3 refills | Status: DC
  Filled 2021-04-03: qty 30, 30d supply, fill #0
  Filled 2021-05-15: qty 30, 30d supply, fill #1
  Filled 2021-06-22: qty 30, 30d supply, fill #2
  Filled 2021-07-23: qty 30, 30d supply, fill #3

## 2021-04-03 MED ORDER — CARVEDILOL 6.25 MG PO TABS
ORAL_TABLET | ORAL | 3 refills | Status: DC
  Filled 2021-04-03: qty 90, 30d supply, fill #0
  Filled 2021-05-15: qty 90, 30d supply, fill #1
  Filled 2021-07-23: qty 90, 30d supply, fill #2
  Filled 2021-09-17: qty 90, 30d supply, fill #3

## 2021-04-03 MED ORDER — DAPAGLIFLOZIN PROPANEDIOL 10 MG PO TABS
ORAL_TABLET | Freq: Every day | ORAL | 6 refills | Status: DC
  Filled 2021-04-03: qty 30, 30d supply, fill #0
  Filled 2021-05-15: qty 30, 30d supply, fill #1

## 2021-04-03 NOTE — Procedures (Signed)
   Patient Name: Sergio Hicks, Sergio Hicks Date: 03/31/2021 Gender: Male D.O.B: 1981/04/14 Age (years): 39 Referring Provider: Bevelyn Buckles Bensimhon Height (inches): 75 Interpreting Physician: Armanda Magic MD, ABSM Weight (lbs): 294 RPSGT: Rolene Arbour BMI: 37 MRN: [id number] Neck Size: 17.00  CLINICAL INFORMATION Sleep Study Type: NPSG  Indication for sleep study: N/A  Epworth Sleepiness Score: 10  SLEEP STUDY TECHNIQUE As per the AASM Manual for the Scoring of Sleep and Associated Events v2.3 (April 2016) with a hypopnea requiring 4% desaturations.  The channels recorded and monitored were frontal, central and occipital EEG, electrooculogram (EOG), submentalis EMG (chin), nasal and oral airflow, thoracic and abdominal wall motion, anterior tibialis EMG, snore microphone, electrocardiogram, and pulse oximetry.  MEDICATIONS Medications self-administered by patient taken the night of the study : ATORVASTATIN, CARVEDILOL, ENTRESTO  SLEEP ARCHITECTURE The study was initiated at 9:47:56 PM and ended at 4:33:29 AM.  Sleep onset time was 52.9 minutes and the sleep efficiency was 81.0%. The total sleep time was 328.6 minutes.  Stage REM latency was 55.5 minutes.  The patient spent 1.5% of the night in stage N1 sleep, 83.0% in stage N2 sleep, 0.0% in stage N3 and 15.5% in REM.  Alpha intrusion was absent.  Supine sleep was 99.96%.  RESPIRATORY PARAMETERS The overall apnea/hypopnea index (AHI) was 8.2 per hour. There were 6 total apneas, including 6 obstructive, 0 central and 0 mixed apneas. There were 39 hypopneas and 6 RERAs.  The AHI during Stage REM sleep was 40.0 per hour.  AHI while supine was 8.2 per hour.  The mean oxygen saturation was 93.7%. The minimum SpO2 during sleep was 84.0%.  moderate snoring was noted during this study.  CARDIAC DATA The 2 lead EKG demonstrated sinus rhythm. The mean heart rate was 56.1 beats per minute. Other EKG findings include:  None.  LEG MOVEMENT DATA The total PLMS were 0 with a resulting PLMS index of 0.0. Associated arousal with leg movement index was 0.0 .  IMPRESSIONS - Mild obstructive sleep apnea occurred during this study (AHI = 8.2/h). - Mild oxygen desaturation was noted during this study (Min O2 = 84.0%). - The patient snored with moderate snoring volume. - No cardiac abnormalities were noted during this study. - Clinically significant periodic limb movements did not occur during sleep. No significant associated arousals.  DIAGNOSIS - Obstructive Sleep Apnea (G47.33) - Nocturnal Hypoxemia (G47.36)  RECOMMENDATIONS - Therapeutic CPAP titration to determine optimal pressure required to alleviate sleep disordered breathing. - Positional therapy avoiding supine position during sleep. - Avoid alcohol, sedatives and other CNS depressants that may worsen sleep apnea and disrupt normal sleep architecture. - Sleep hygiene should be reviewed to assess factors that may improve sleep quality. - Weight management and regular exercise should be initiated or continued if appropriate.  [Electronically signed] 04/03/2021 07:37 PM  Armanda Magic MD, ABSM Diplomate, American Board of Sleep Medicine

## 2021-04-11 ENCOUNTER — Telehealth: Payer: Self-pay | Admitting: *Deleted

## 2021-04-11 DIAGNOSIS — G4733 Obstructive sleep apnea (adult) (pediatric): Secondary | ICD-10-CM

## 2021-04-11 NOTE — Telephone Encounter (Signed)
Informed patient of sleep study results and patient understanding was verbalized. Patient understands his sleep study showed they have sleep apnea and recommend CPAP titration. Please set up titration in the sleep lab.   Pt is aware of her/his results Titration sent to sleep pool.  Left detailed message on voicemail and informed patient to call back with questions

## 2021-04-11 NOTE — Telephone Encounter (Signed)
-----   Message from Quintella Reichert, MD sent at 04/03/2021  7:39 PM EDT ----- Please let patient know that they have sleep apnea and recommend CPAP titration. Please set up titration in the sleep lab.

## 2021-04-13 ENCOUNTER — Other Ambulatory Visit (HOSPITAL_COMMUNITY): Payer: Self-pay

## 2021-04-17 ENCOUNTER — Ambulatory Visit (HOSPITAL_COMMUNITY)
Admission: RE | Admit: 2021-04-17 | Discharge: 2021-04-17 | Disposition: A | Discharge status: Home / Self Care | Payer: Medicaid Other | Source: Ambulatory Visit | Attending: Family Medicine | Admitting: Family Medicine

## 2021-04-17 ENCOUNTER — Telehealth: Payer: Self-pay | Admitting: *Deleted

## 2021-04-17 ENCOUNTER — Encounter (HOSPITAL_COMMUNITY): Payer: Self-pay

## 2021-04-17 ENCOUNTER — Other Ambulatory Visit (HOSPITAL_COMMUNITY): Payer: Self-pay

## 2021-04-17 ENCOUNTER — Other Ambulatory Visit (HOSPITAL_COMMUNITY): Payer: Self-pay | Admitting: *Deleted

## 2021-04-17 ENCOUNTER — Other Ambulatory Visit: Payer: Self-pay

## 2021-04-17 DIAGNOSIS — I5022 Chronic systolic (congestive) heart failure: Secondary | ICD-10-CM | POA: Insufficient documentation

## 2021-04-17 DIAGNOSIS — I429 Cardiomyopathy, unspecified: Secondary | ICD-10-CM | POA: Diagnosis not present

## 2021-04-17 DIAGNOSIS — Z95810 Presence of automatic (implantable) cardiac defibrillator: Secondary | ICD-10-CM | POA: Insufficient documentation

## 2021-04-17 LAB — ECHOCARDIOGRAM COMPLETE: S' Lateral: 5 cm

## 2021-04-17 MED ORDER — PERFLUTREN LIPID MICROSPHERE
1.0000 mL | INTRAVENOUS | Status: AC | PRN
  Administered 2021-04-17: 5 mL via INTRAVENOUS
  Filled 2021-04-17: qty 10

## 2021-04-17 MED ORDER — FUROSEMIDE 40 MG PO TABS
40.0000 mg | ORAL_TABLET | ORAL | 3 refills | Status: DC | PRN
  Filled 2021-04-17 (×2): qty 30, 30d supply, fill #0

## 2021-04-17 MED ORDER — POTASSIUM CHLORIDE CRYS ER 20 MEQ PO TBCR
40.0000 meq | EXTENDED_RELEASE_TABLET | Freq: Every day | ORAL | 3 refills | Status: DC | PRN
  Filled 2021-04-17 (×2): qty 30, 15d supply, fill #0

## 2021-04-17 NOTE — Progress Notes (Signed)
  Echocardiogram 2D Echocardiogram has been performed.  Sergio Hicks 04/17/2021, 4:33 PM

## 2021-04-17 NOTE — Telephone Encounter (Signed)
PA for CPAP titration faxed to [facility].

## 2021-04-20 ENCOUNTER — Telehealth (HOSPITAL_COMMUNITY): Payer: Self-pay | Admitting: Vascular Surgery

## 2021-04-20 NOTE — Telephone Encounter (Signed)
Left pt VM to call back to resch 5/23 appt w/ db for next ava opening, db will be out of office

## 2021-04-23 ENCOUNTER — Telehealth (HOSPITAL_COMMUNITY): Payer: Self-pay | Admitting: Vascular Surgery

## 2021-04-23 NOTE — Telephone Encounter (Signed)
2nd attempt to reach pt to resch 5/23 appt

## 2021-05-01 NOTE — Addendum Note (Signed)
Addended by: Reesa Chew on: 05/01/2021 03:56 PM   Modules accepted: Orders

## 2021-05-01 NOTE — Telephone Encounter (Signed)
[employer] Auth received for CPAP titration. Auth # [id number]. Valid dates 05/30/21 to 07/29/21. Ok to  Schedule.

## 2021-05-15 ENCOUNTER — Other Ambulatory Visit (HOSPITAL_COMMUNITY): Payer: Self-pay

## 2021-05-16 NOTE — Telephone Encounter (Signed)
Patient is scheduled for CPAP Titration on 7-14/22. Patient understands his titration study will be done at [facility] sleep lab. Patient understands he will receive a letter in a week or so detailing appointment, date, time, and location. Patient understands to call if he does not receive the letter  in a timely manner. Left detailed message on voicemail and informed patient to call back with questions.

## 2021-05-21 ENCOUNTER — Encounter (HOSPITAL_COMMUNITY): Payer: Medicaid Other | Admitting: Internal Medicine

## 2021-05-21 ENCOUNTER — Encounter (HOSPITAL_COMMUNITY): Payer: Self-pay | Admitting: Cardiology

## 2021-05-31 ENCOUNTER — Emergency Department (HOSPITAL_BASED_OUTPATIENT_CLINIC_OR_DEPARTMENT_OTHER): Payer: Medicaid Other

## 2021-05-31 ENCOUNTER — Emergency Department (HOSPITAL_COMMUNITY): Payer: Medicaid Other

## 2021-05-31 ENCOUNTER — Other Ambulatory Visit (HOSPITAL_COMMUNITY): Payer: Self-pay | Admitting: *Deleted

## 2021-05-31 ENCOUNTER — Emergency Department (HOSPITAL_COMMUNITY)
Admission: EM | Admit: 2021-05-31 | Discharge: 2021-05-31 | Disposition: A | Discharge status: Home / Self Care | Payer: Medicaid Other | Attending: Emergency Medicine | Admitting: Emergency Medicine

## 2021-05-31 ENCOUNTER — Encounter (HOSPITAL_COMMUNITY): Payer: Self-pay | Admitting: Emergency Medicine

## 2021-05-31 ENCOUNTER — Encounter (HOSPITAL_COMMUNITY): Payer: Self-pay

## 2021-05-31 DIAGNOSIS — R609 Edema, unspecified: Secondary | ICD-10-CM | POA: Diagnosis not present

## 2021-05-31 DIAGNOSIS — Z87891 Personal history of nicotine dependence: Secondary | ICD-10-CM | POA: Insufficient documentation

## 2021-05-31 DIAGNOSIS — R0789 Other chest pain: Secondary | ICD-10-CM | POA: Diagnosis present

## 2021-05-31 DIAGNOSIS — Z79899 Other long term (current) drug therapy: Secondary | ICD-10-CM | POA: Insufficient documentation

## 2021-05-31 DIAGNOSIS — Z7982 Long term (current) use of aspirin: Secondary | ICD-10-CM | POA: Insufficient documentation

## 2021-05-31 DIAGNOSIS — I2510 Atherosclerotic heart disease of native coronary artery without angina pectoris: Secondary | ICD-10-CM | POA: Diagnosis not present

## 2021-05-31 DIAGNOSIS — R071 Chest pain on breathing: Secondary | ICD-10-CM

## 2021-05-31 DIAGNOSIS — M7989 Other specified soft tissue disorders: Secondary | ICD-10-CM

## 2021-05-31 DIAGNOSIS — I5022 Chronic systolic (congestive) heart failure: Secondary | ICD-10-CM | POA: Diagnosis not present

## 2021-05-31 DIAGNOSIS — R079 Chest pain, unspecified: Secondary | ICD-10-CM

## 2021-05-31 HISTORY — DX: Tobacco use: Z72.0

## 2021-05-31 HISTORY — DX: Alcohol abuse, uncomplicated: F10.10

## 2021-05-31 HISTORY — DX: Sleep apnea, unspecified: G47.30

## 2021-05-31 HISTORY — DX: Chronic systolic (congestive) heart failure: I50.22

## 2021-05-31 HISTORY — DX: Ischemic cardiomyopathy: I25.5

## 2021-05-31 LAB — CBC
HCT: 47.5 % (ref 39.0–52.0)
Hemoglobin: 16.3 g/dL (ref 13.0–17.0)
MCH: 29.1 pg (ref 26.0–34.0)
MCHC: 34.3 g/dL (ref 30.0–36.0)
MCV: 84.8 fL (ref 80.0–100.0)
Platelets: 336 10*3/uL (ref 150–400)
RBC: 5.6 MIL/uL (ref 4.22–5.81)
RDW: 13 % (ref 11.5–15.5)
WBC: 10.9 10*3/uL — ABNORMAL HIGH (ref 4.0–10.5)
nRBC: 0 % (ref 0.0–0.2)

## 2021-05-31 LAB — BASIC METABOLIC PANEL
Anion gap: 11 (ref 5–15)
BUN: 12 mg/dL (ref 6–20)
CO2: 24 mmol/L (ref 22–32)
Calcium: 9.4 mg/dL (ref 8.9–10.3)
Chloride: 101 mmol/L (ref 98–111)
Creatinine, Ser: 0.95 mg/dL (ref 0.61–1.24)
GFR, Estimated: >60 mL/min (ref >60)
Glucose, Bld: 109 mg/dL — ABNORMAL HIGH (ref 70–99)
Potassium: 3.8 mmol/L (ref 3.5–5.1)
Sodium: 136 mmol/L (ref 135–145)

## 2021-05-31 LAB — TROPONIN I (HIGH SENSITIVITY)
Troponin I (High Sensitivity): 5 ng/L (ref <18)
Troponin I (High Sensitivity): 5 ng/L (ref <18)

## 2021-05-31 LAB — D-DIMER, QUANTITATIVE: D-Dimer, Quant: <0.27 ug/mL-FEU (ref 0.00–0.50)

## 2021-05-31 MED ORDER — FENTANYL CITRATE (PF) 100 MCG/2ML IJ SOLN
50.0000 ug | Freq: Once | INTRAMUSCULAR | Status: AC
  Administered 2021-05-31: 50 ug via INTRAVENOUS
  Filled 2021-05-31: qty 2

## 2021-05-31 NOTE — ED Notes (Signed)
ED Provider at bedside. 

## 2021-05-31 NOTE — ED Triage Notes (Signed)
Patient with history of congestive heart failure complains of chest pain and lower extremity edema that he noticed this morning at 0600. Patient also reports shortness of breath. Patient alert, oriented, ambulatory, and in no apparent distress at this time.

## 2021-05-31 NOTE — Discharge Instructions (Signed)
All the labs today look good.  If  you have worsening symptoms with shortness of breath, fever, severe chest or abd pain return to the ER

## 2021-05-31 NOTE — Progress Notes (Signed)
Lower extremity venous has been completed.   Preliminary results in CV Proc.   Blanch Media 05/31/2021 4:03 PM

## 2021-05-31 NOTE — Progress Notes (Addendum)
Have s/o to on call APP Bjorn Loser Barrett to review d-dimer when it comes back. Dr. Jens Som also aware.

## 2021-05-31 NOTE — ED Provider Notes (Signed)
Pt's [place] neg for clot, d-dimer neg.  Cardiology wishes to discharge pt and have outpt f/u.   Gwyneth Sprout, MD 05/31/21 [phone]

## 2021-05-31 NOTE — Consult Note (Addendum)
Cardiology Consultation:   Patient ID: Sergio Hicks MRN: [id number]; DOB: [birth year]  Admit date: 05/31/2021 Date of Consult: 05/31/2021  PCP:  Patient, No Pcp Per (Inactive)   [facility] Providers Cardiologist:  Dr. Gala Romney   Patient Profile:   Sergio Hicks is a 40 y.o. 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 Mayford Knife), former ETOH/tobacco abuse who is being seen 05/31/2021 for the evaluation of chest pain at the request of Dr. Rosalia Hammers.  History of Present Illness:   Sergio Hicks was admitted in 12/2017 with out of hospital anterior MI. He had had flu-like symptoms the week prior and had been treated for flu and PNA. The day prior to admission he had worsening CP/SOB. He came to the ER the following day due to persistence of symptoms (some atypical quality with sharpness). He was found to have anterior Q waves and EF 30-35%. He underwent cath showing 100% occluded LAD. He was initially treated medically but developed persistent CP so was taken for PCI, treated with DES to prox/mid LAD. He had persistent LV dysfunction despite medical therapy and ultimately underwent SQ [place] [facility] ICD by Dr. Ladona Ridgel 01/2019. He had Covid in 11/2020 but did not require hospitalization. Last echo 04/17/21 showed EF 30-35%, global hypokinesis, grade 2 DD, severely dilated LA.  He woke up this morning around 6:30am with substernal chest pain that primary occurs with inspiration. It is not better or worse with recumbency or sitting up. This does not occur or worsen with exertion. He also noticed his left leg was a little bit more swollen than usual. He has chronic DOE which has been slightly worse the last few days than before. He also noticed a headache. No significant orthopnea. No dizziness, palpitations, fever, coughing, or hemoptysis. No recent travel, surgery, injury, or bedrest. Labs show negative  troponin x 1, mild leukocytosis of 10.9, glucose 109, CXR NAD, VSS. He was up 2lb from baseline today so he took a Lasix and potassium this morning (usually only takes PRN) with excellent UOP. Edema has improved. Because the chest pain reminded him of the previous chest pain he had in 2019, he presented to the ED for evaluation. Of note he sent a Mychart message to the CHF office reporting RLE swelling and originally had OP duplex planned - he clarifies now he meant the left leg. EKG unchanged from previous. He is not tachycardic, tachypneic or hypoxic. Received fentanyl with some improvement in sx.   Past Medical History:  Diagnosis Date  . Alcohol abuse   . Asthma   . Chronic systolic CHF (congestive heart failure) (HCC)   . Coronary artery disease    a. OOH/late presenting MI 2019 s/p DES to prox/mid LAD.  . Ischemic cardiomyopathy   . Mild sleep apnea   . Myocardial infarction (HCC)   . Tobacco abuse     Past Surgical History:  Procedure Laterality Date  . CORONARY STENT INTERVENTION N/A 01/20/2018   Procedure: CORONARY STENT INTERVENTION;  Surgeon: Swazil[place], Peter M, MD;  Location: [facility] INVASIVE CV LAB;  Service: Cardiovascular;  Laterality: N/A;  . LEFT HEART CATH AND CORONARY ANGIOGRAPHY N/A 01/18/2018   Procedure: LEFT HEART CATH AND CORONARY ANGIOGRAPHY;  Surgeon: Runell Gess, MD;  Location: [facility] INVASIVE CV LAB;  Service: Cardiovascular;  Laterality: N/A;  . SUBQ ICD IMPLANT  02/17/2019  . SUBQ ICD IMPLANT N/A 02/17/2019  Procedure: SUBQ ICD IMPLANT;  Surgeon: Marinus Maw, MD;  Location: [facility] INVASIVE CV LAB;  Service: Cardiovascular;  Laterality: N/A;     Home Medications:  Prior to Admission medications   Medication Sig Start Date End Date Taking? Authorizing Provider  albuterol (PROVENTIL HFA;VENTOLIN HFA) 108 (90 Base) MCG/ACT inhaler Inhale 1-2 puffs into the lungs every 6 (six) hours as needed for wheezing or shortness of breath.    [provider]  aspirin EC  81 MG EC tablet Take 1 tablet (81 mg total) by mouth daily. 01/23/18   Graciella Freer, PA-C  atorvastatin (LIPITOR) 80 MG tablet TAKE 1 TABLET BY MOUTH DAILY 04/03/21 04/03/22  Bensimhon, Bevelyn Buckles, MD  carvedilol (COREG) 6.25 MG tablet TAKE 1 and 1/2  TABLETS BY MOUTH 2 TIMES DAILY 04/03/21 04/03/22  Bensimhon, Bevelyn Buckles, MD  clopidogrel (PLAVIX) 75 MG tablet TAKE 1 TABLET BY MOUTH ONCE DAILY WITH BREAKFAST 08/30/20 08/30/21  Bensimhon, Bevelyn Buckles, MD  dapagliflozin propanediol (FARXIGA) 10 MG TABS tablet TAKE 1 TABLET BY MOUTH ONCE DAILY BEFORE BREAKFAST 04/03/21 04/03/22  Bensimhon, Bevelyn Buckles, MD  furosemide (LASIX) 40 MG tablet Take 1 tablet (40 mg total) by mouth as needed. 04/17/21   Bensimhon, Bevelyn Buckles, MD  ibuprofen (MOTRIN IB) 200 MG tablet Take 2 tablets (400 mg total) by mouth every 6 (six) hours as needed for headache or mild pain. 08/14/20 08/14/21  Juliet Rude, PA-C  loratadine (CLARITIN) 10 MG tablet Take 10 mg by mouth daily as needed for allergies.    [provider]  Multiple Vitamin (MULTIVITAMIN) tablet Take 1 tablet by mouth daily.    [provider]  nitroGLYCERIN (NITROSTAT) 0.4 MG SL tablet Place 1 tablet (0.4 mg total) under the tongue every 5 (five) minutes as needed for chest pain. 01/22/18   Graciella Freer, PA-C  potassium chloride SA (KLOR-CON) 20 MEQ tablet Take 2 tablets (40 mEq total) by mouth daily as needed (when taking furosemide). 04/17/21   Bensimhon, Bevelyn Buckles, MD  spironolactone (ALDACTONE) 25 MG tablet TAKE 1 TABLET BY MOUTH ONCE A DAY 04/03/21 04/03/22  Bensimhon, Bevelyn Buckles, MD  vitamin B-12 (CYANOCOBALAMIN) 100 MCG tablet Take 500 mcg by mouth daily.    [provider]    Inpatient Medications: Scheduled Meds:  Continuous Infusions:  PRN Meds:   Allergies:   No Known Allergies  Social History:   Social History   Socioeconomic History  . Marital status: Single    Spouse name: Not on file  . Number of children: Not on file  .  Years of education: Not on file  . Highest education level: Not on file  Occupational History  . Occupation: chef  Tobacco Use  . Smoking status: Former Smoker    Packs/day: 0.00    Types: Cigarettes  . Smokeless tobacco: Never Used  . Tobacco comment: 2019  Vaping Use  . Vaping Use: Never used  Substance and Sexual Activity  . Alcohol use: Not Currently    Alcohol/week: 1.0 standard drink    Types: 1 Glasses of wine per week    Comment: quit in 2021  . Drug use: Never  . Sexual activity: Not on file  Other Topics Concern  . Not on file  Social History Narrative  . Not on file   Social Determinants of Health   Financial Resource Strain: Not on file  Food Insecurity: Not on file  Transportation Needs: Not on file  Physical Activity: Not on file  Stress: Not on file  Social Connections: Not on file  Intimate Partner Violence: Not on file    Family History:   Family History  Adopted: Yes     ROS:  Please see the history of present illness.   All other ROS reviewed and negative.     Physical Exam/Data:   Vitals:   05/31/21 1221 05/31/21 1315 05/31/21 1345 05/31/21 1430  BP:  108/78 (!) 109/93 106/61  Pulse:  72 81 76  Resp:  16 20 (!) 22  Temp:      TempSrc:      SpO2:  97% 97% 98%  Weight: 133.4 kg     Height: 6\' 3"  (1.905 m)      No intake or output data in the 24 hours ending 05/31/21 1511 Last 3 Weights 05/31/2021 03/31/2021 02/19/2021  Weight (lbs) 294 lb 294 lb 304 lb  Weight (kg) 133.358 kg 133.358 kg 137.893 kg     Body mass index is 36.75 kg/m.  General: Well developed, well nourished, in no acute distress. Head: Normocephalic, atraumatic, sclera non-icteric, no xanthomas, nares are without discharge. Neck: Negative for carotid bruits. JVP not elevated. Lungs: Clear bilaterally to auscultation without wheezes, rales, or rhonchi. Breathing is unlabored. Heart: RRR S1 S2 without murmurs, rubs, or gallops.  Abdomen: Soft, non-tender, non-distended  with normoactive bowel sounds. No rebound/guarding. Extremities: No clubbing or cyanosis. No edema. Distal pedal pulses are 2+ and equal bilaterally. Neuro: Alert and oriented X 3. Moves all extremities spontaneously. Psych:  Responds to questions appropriately with a normal affect.  EKG:  The EKG was personally reviewed and demonstrates:  NSR 86bpm, subtle ST depression III, avF, baseline elevation I, avL, prior septal infarct, prior lateral infarct, appears similar to prior tracing  Relevant CV Studies: 2D echo 03/2021 1. Left ventricular ejection fraction, by estimation, is 30 to 35%. The  left ventricle has moderately decreased function. The left ventricle  demonstrates global hypokinesis. The left ventricular internal cavity size  was mildly dilated. Left ventricular  diastolic parameters are consistent with Grade II diastolic dysfunction  (pseudonormalization).  2. Right ventricular systolic function is normal. The right ventricular  size is normal.  3. Left atrial size was severely dilated.  4. The mitral valve is grossly normal. No evidence of mitral valve  regurgitation.  5. The aortic valve was not well visualized. Aortic valve regurgitation  is not visualized. No aortic stenosis is present.  6. The inferior vena cava is normal in size with greater than 50%  respiratory variability, suggesting right atrial pressure of 3 mmHg.   Comparison(s): A prior study was performed on 01/25/20. LV function is  likely similar (appears worse on contrasted images with no contrast given  in prior) and with improvement in LV size.   Diagnostic Cath 12/217  IMPRESSION:Mr. Line apparently had an out of hospital anterior wall myocardial infarction approximately one week ago. He has been experiencing post MI symptoms with atypical chest pain, weakness and shortness of breath. His EKG is consistent with poorly progression across the precordium although there were no acute changes and his  troponins have been flat in the 7-8 range. 2-D echo showed an EF in the 20th 25% range with severe wall motion of the malleus and the LAD territory. This Showed an occluded proximal LAD with grade 2 right left collaterals. I do not think there is any utility at this point for intervention. Rather, I would obtain a cardiac MRI to assess viability in the anterior  wall. Given his collaterals may have residual viability which would support a subsequent LAD intervention. Short of that, I would recommend continued optimal medical therapy for ischemic cardiomyopathy. The sheath was removed and a TR band was placed on the right wrist to achieve patent hemostasis. The patient left the lab in stable condition.  Nanetta Batty. MD, [facility] 01/18/2018 1:21 PM  Coronary Stent Intervention 12/2017   Ost LAD to Prox LAD lesion is 100% stenosed.  Post intervention, there is a 0% residual stenosis.  A drug-eluting stent was successfully placed using a STENT SYNERGY DES 3X32.  A drug-eluting stent was successfully placed using a STENT SYNERGY DES 4X28.  Ost 1st Diag lesion is 90% stenosed.  1. Successful stenting of the proximal to mid LAD with overlapping DES stents.   Plan: DAPT for at least one year. Optimize medical therapy.    Laboratory Data:  High Sensitivity Troponin:   Recent Labs  Lab 05/31/21 1154  TROPONINIHS 5     Chemistry Recent Labs  Lab 05/31/21 1154  NA 136  K 3.8  CL 101  CO2 24  GLUCOSE 109*  BUN 12  CREATININE 0.95  CALCIUM 9.4  GFRNONAA >60  ANIONGAP 11    No results for input(s): PROT, ALBUMIN, AST, ALT, ALKPHOS, BILITOT in the last 168 hours. Hematology Recent Labs  Lab 05/31/21 1154  WBC 10.9*  RBC 5.60  HGB 16.3  HCT 47.5  MCV 84.8  MCH 29.1  MCHC 34.3  RDW 13.0  PLT 336   BNPNo results for input(s): BNP, PROBNP in the last 168 hours.  DDimer No results for input(s): DDIMER in the last 168 hours.   Radiology/Studies:  DG Chest 2  View  Result Date: 05/31/2021 CLINICAL DATA:  40 year old male with left side chest pain, shortness of breath and tingling. EXAM: CHEST - 2 VIEW COMPARISON:  Portable chest 08/14/2020 and earlier. FINDINGS: New left chest generator device associated with subcutaneous anterior AICD. Mediastinal contours remain normal. Visualized tracheal air column is within normal limits. Both lungs appear clear. No pneumothorax or pleural effusion. No acute osseous abnormality identified. Negative visible bowel gas. IMPRESSION: 1.  No acute cardiopulmonary abnormality. 2. Anterior subcutaneous AICD placed since last year. Electronically Signed   By: Odessa Fleming M.D.   On: 05/31/2021 12:11     Assessment and Plan:   1. Atypical pleuritic-type chest pain and left lower extremity edema - symptoms atypical and no objective evidence of ischemia thus far- initial troponin negative and EKG unchanged from prior - EKG and HR not suggestive of pericarditis, pain does not worsen with recumbency or improve sitting up - would suggest another troponin to follow (pending at this time) - needs eval for PE/DVT although suspicion relatively low given hemodynamically stable and no asymmetric edema seen on exam after patient took Lasix/KCl this AM - check d-dimer and left LE venous duplex per MD - see MD thoughts below  2. CAD s/p prior MI/PCI - will follow evaluation as above  3. Chronic systolic CHF - volume status appears stable on exam  4. Former ETOH/tobacco - remains abstinent  5. Mild leukocytosis - WBC 10.9 but relatively stable compared to prior, afebrile   Risk Assessment/Risk Scores:     HEAR Score (for undifferentiated chest pain):  HEAR Score: 3  New York Heart Association (NYHA) Functional Class NYHA Class II-III   For questions or updates, please contact [facility] Please consult [email or web address] for contact info under    Signed, Tacey Ruiz  Dunn, PA-C  05/31/2021 3:11 PM As above, patient seen and  examined.  Briefly he is a 40 year old male with past medical history of coronary artery disease with previous anterior myocardial infarction/PCI of LAD, ischemic cardiomyopathy, chronic combined systolic/diastolic chest heart failure, alcohol abuse with chest pain.  Patient states that at approximately 6 AM he developed chest tightness and increased with inspiration.  It did not radiate and there was no associated dyspnea, nausea or diaphoresis.  He has noticed recent increased dyspnea on exertion but no orthopnea or PND.  He complained of pain in his left calf and mild edema.  He did take an extra Lasix with no improvement.  He therefore presented to the emergency room for further evaluation. Electrocardiogram shows sinus rhythm, septal and lateral infarct.  Troponin is 5.  1 chest pain-symptoms are atypical.  Electrocardiogram shows no new ST changes and initial troponin is normal.  We will plan to repeat troponin now as it has been 9 hours since symptom onset.  If normal he is essentially ruled out.  I do not think he requires further ischemia evaluation and follow-up troponins are normal.  There is a pleuritic component to his chest pain and he is complaining of pain in his left calf.  We will check D-dimer to screen for pulmonary embolus and also left lower extremity venous Dopplers to exclude DVT.  If normal patient can be discharged and follow-up in CHF clinic.  2 ischemic cardiomyopathy/chronic systolic congestive heart failure-continue Entresto, carvedilol, Lasix, spironolactone.  3 coronary artery disease-continue aspirin, Plavix and statin.  4 prior ICD-followed by electrophysiology.  Olga MillersBrian Onica Davidovich, MD

## 2021-05-31 NOTE — ED Provider Notes (Signed)
[facility] EMERGENCY DEPARTMENT Provider Note   CSN: [id number] Arrival date & time: 05/31/21  1147     History Chief Complaint  Patient presents with  . Chest Pain    Wheeler Incorvaia is a 40 y.o. male.  HPI     40 year old male who had MRI in January 2019 with coronary artery catheterization that showed small LAD with right to left collaterals.  At that time, he presented with somewhat atypical chest pain, EKG with poor R wave progression consistent with out of hospital and I in the previous week, but with troponins flat in the 7-8 range.  He had severe LV dysfunction at that time.  Since that time, he has had a pacemaker, defibrillator placed.  Follow-up echo obtained April 17, 2021 his increase in EF 30 to 35% with the left ventricle demonstrating global hypokinesis, right ventricular function normal.  He again describes his pain as sharp in the left lower anterior chest with some pleuritic component.  He is not dyspneic.  Pain has been as severe as 6 out of 10 but is currently 2 out of 10.  Has been going on mostly for a week.  He denies taking any changes in his medication but does report taking his home medications as prescribed.  He does report some dyspnea with exertion.  He denies any history of PE or DVT or DVT risk factors.  He denies any fever chills or cough.  Past Medical History:  Diagnosis Date  . Asthma   . CHF (congestive heart failure) (HCC) 12/2016  . Coronary artery disease   . Myocardial infarction [facility])     Patient Active Problem List   Diagnosis Date Noted  . Multiple trauma 08/13/2020  . ICD (implantable cardioverter-defibrillator) in place 03/15/2020  . Chronic systolic heart failure (HCC) 02/17/2019  . CAD (coronary artery disease) 04/27/2018  . Obesity 01/26/2018  . ACS (acute coronary syndrome) (HCC) 01/18/2018  . Heart attack (HCC)   . Ischemic cardiomyopathy     Past Surgical History:  Procedure Laterality Date  . CORONARY STENT  INTERVENTION N/A 01/20/2018   Procedure: CORONARY STENT INTERVENTION;  Surgeon: Swazil[place], Peter M, MD;  Location: [facility] INVASIVE CV LAB;  Service: Cardiovascular;  Laterality: N/A;  . LEFT HEART CATH AND CORONARY ANGIOGRAPHY N/A 01/18/2018   Procedure: LEFT HEART CATH AND CORONARY ANGIOGRAPHY;  Surgeon: Runell Gess, MD;  Location: [facility] INVASIVE CV LAB;  Service: Cardiovascular;  Laterality: N/A;  . SUBQ ICD IMPLANT  02/17/2019  . SUBQ ICD IMPLANT N/A 02/17/2019   Procedure: SUBQ ICD IMPLANT;  Surgeon: Marinus Maw, MD;  Location: [facility] INVASIVE CV LAB;  Service: Cardiovascular;  Laterality: N/A;       Family History  Adopted: Yes    Social History   Tobacco Use  . Smoking status: Former Smoker    Packs/day: 0.00    Types: Cigarettes  . Smokeless tobacco: Never Used  . Tobacco comment: 2019  Vaping Use  . Vaping Use: Never used  Substance Use Topics  . Alcohol use: Yes    Alcohol/week: 1.0 standard drink    Types: 1 Glasses of wine per week    Comment: 1-2 days a week   . Drug use: Never    Home Medications Prior to Admission medications   Medication Sig Start Date End Date Taking? Authorizing Provider  albuterol (PROVENTIL HFA;VENTOLIN HFA) 108 (90 Base) MCG/ACT inhaler Inhale 1-2 puffs into the lungs every 6 (six) hours as needed for wheezing  or shortness of breath.    [provider]  aspirin EC 81 MG EC tablet Take 1 tablet (81 mg total) by mouth daily. 01/23/18   Graciella Freer, PA-C  atorvastatin (LIPITOR) 80 MG tablet TAKE 1 TABLET BY MOUTH DAILY 04/03/21 04/03/22  Bensimhon, Bevelyn Buckles, MD  carvedilol (COREG) 6.25 MG tablet TAKE 1 and 1/2  TABLETS BY MOUTH 2 TIMES DAILY 04/03/21 04/03/22  Bensimhon, Bevelyn Buckles, MD  clopidogrel (PLAVIX) 75 MG tablet TAKE 1 TABLET BY MOUTH ONCE DAILY WITH BREAKFAST 08/30/20 08/30/21  Bensimhon, Bevelyn Buckles, MD  dapagliflozin propanediol (FARXIGA) 10 MG TABS tablet TAKE 1 TABLET BY MOUTH ONCE DAILY BEFORE BREAKFAST 04/03/21 04/03/22  Bensimhon,  Bevelyn Buckles, MD  furosemide (LASIX) 40 MG tablet Take 1 tablet (40 mg total) by mouth as needed. 04/17/21   Bensimhon, Bevelyn Buckles, MD  ibuprofen (MOTRIN IB) 200 MG tablet Take 2 tablets (400 mg total) by mouth every 6 (six) hours as needed for headache or mild pain. 08/14/20 08/14/21  Juliet Rude, PA-C  loratadine (CLARITIN) 10 MG tablet Take 10 mg by mouth daily as needed for allergies.    [provider]  Multiple Vitamin (MULTIVITAMIN) tablet Take 1 tablet by mouth daily.    [provider]  nitroGLYCERIN (NITROSTAT) 0.4 MG SL tablet Place 1 tablet (0.4 mg total) under the tongue every 5 (five) minutes as needed for chest pain. 01/22/18   Graciella Freer, PA-C  potassium chloride SA (KLOR-CON) 20 MEQ tablet Take 2 tablets (40 mEq total) by mouth daily as needed (when taking furosemide). 04/17/21   Bensimhon, Bevelyn Buckles, MD  spironolactone (ALDACTONE) 25 MG tablet TAKE 1 TABLET BY MOUTH ONCE A DAY 04/03/21 04/03/22  Bensimhon, Bevelyn Buckles, MD  vitamin B-12 (CYANOCOBALAMIN) 100 MCG tablet Take 500 mcg by mouth daily.    [provider]    Allergies    Patient has no known allergies.  Review of Systems   Review of Systems  All other systems reviewed and are negative.   Physical Exam Updated Vital Signs BP 108/78   Pulse 72   Temp 98.3 F (36.8 C) (Oral)   Resp 16   Ht 1.905 m (6\' 3" )   Wt 133.4 kg   SpO2 97%   BMI 36.75 kg/m   Physical Exam Vitals and nursing note reviewed.  Constitutional:      Appearance: He is obese.  HENT:     Head: Normocephalic and atraumatic.  Eyes:     Extraocular Movements: Extraocular movements intact.     Pupils: Pupils are equal, round, and reactive to light.  Cardiovascular:     Rate and Rhythm: Normal rate and regular rhythm.     Heart sounds: Normal heart sounds.  Pulmonary:     Effort: Pulmonary effort is normal.     Breath sounds: Normal breath sounds.  Abdominal:     General: Bowel sounds are normal.      Palpations: Abdomen is soft.  Musculoskeletal:        General: Normal range of motion.     Cervical back: Normal range of motion and neck supple.  Skin:    General: Skin is warm and dry.     Capillary Refill: Capillary refill takes 2 to 3 seconds.  Neurological:     General: No focal deficit present.     Mental Status: He is alert.     ED Results / Procedures / Treatments   Labs (all labs ordered are listed,  but only abnormal results are displayed) Labs Reviewed  BASIC METABOLIC PANEL - Abnormal; Notable for the following components:      Result Value   Glucose, Bld 109 (*)    All other components within normal limits  CBC - Abnormal; Notable for the following components:   WBC 10.9 (*)    All other components within normal limits  TROPONIN I (HIGH SENSITIVITY)  TROPONIN I (HIGH SENSITIVITY)    EKG EKG Interpretation  Date/Time:  Thursday May 31 2021 11:51:14 EDT Ventricular Rate:  86 PR Interval:  142 QRS Duration: 92 QT Interval:  364 QTC Calculation: 435 R Axis:   88 Text Interpretation: Normal sinus rhythm Septal infarct , age undetermined Lateral infarct , age undetermined Abnormal ECG Confirmed by Margarita Grizzle [id number]) on 05/31/2021 1:39:17 PM   Radiology DG Chest 2 View  Result Date: 05/31/2021 CLINICAL DATA:  40 year old male with left side chest pain, shortness of breath and tingling. EXAM: CHEST - 2 VIEW COMPARISON:  Portable chest 08/14/2020 and earlier. FINDINGS: New left chest generator device associated with subcutaneous anterior AICD. Mediastinal contours remain normal. Visualized tracheal air column is within normal limits. Both lungs appear clear. No pneumothorax or pleural effusion. No acute osseous abnormality identified. Negative visible bowel gas. IMPRESSION: 1.  No acute cardiopulmonary abnormality. 2. Anterior subcutaneous AICD placed since last year. Electronically Signed   By: Odessa Fleming M.D.   On: 05/31/2021 12:11    Procedures Procedures    Medications Ordered in ED Medications  fentaNYL (SUBLIMAZE) injection 50 mcg (has no administration in time range)    ED Course  I have reviewed the triage vital signs and the nursing notes.  Pertinent labs & imaging results that were available during my care of the patient were reviewed by me and considered in my medical decision making (see chart for details).    MDM Rules/Calculators/A&P                          40 year old male with known history of coronary artery disease presents today with intermittent chest pain for 1 week.  EKG is unchanged from prior.  First troponin is negative.  His pain here is 2 out of 10.  Will consult cardiology Discussed with cardiology who will see and evaluate Second plan pending Plan disposition per cardiology consult. Cardiology added D-dimer and DVT studies.  They are also awaiting second troponin and report if these are negative the patient will likely be discharged Dopplers done without evidence of DVT Repeat troponin flat at 5 D-dimer still pending Signed out to Dr. Anitra Lauth at 1700.  With plan to position as per cardiology recommendation Final Clinical Impression(s) / ED Diagnoses Final diagnoses:  Chest pain, unspecified type    Rx / DC Orders ED Discharge Orders    None       Margarita Grizzle, MD 05/31/21 1655

## 2021-06-05 ENCOUNTER — Other Ambulatory Visit (HOSPITAL_COMMUNITY): Payer: Self-pay

## 2021-06-05 ENCOUNTER — Other Ambulatory Visit (HOSPITAL_COMMUNITY): Payer: Self-pay | Admitting: Internal Medicine

## 2021-06-05 MED FILL — Clopidogrel Bisulfate Tab 75 MG (Base Equiv): ORAL | 90 days supply | Qty: 90 | Fill #0 | Status: AC

## 2021-06-07 ENCOUNTER — Encounter (HOSPITAL_COMMUNITY): Payer: Self-pay

## 2021-06-07 ENCOUNTER — Other Ambulatory Visit (HOSPITAL_COMMUNITY): Payer: Self-pay

## 2021-06-07 MED ORDER — ENTRESTO 49-51 MG PO TABS
1.0000 | ORAL_TABLET | Freq: Two times a day (BID) | ORAL | 3 refills | Status: DC
  Filled 2021-06-07: qty 60, 30d supply, fill #0
  Filled 2021-07-23: qty 60, 30d supply, fill #1
  Filled 2021-09-17: qty 60, 30d supply, fill #2
  Filled 2021-10-22: qty 60, 30d supply, fill #3
  Filled 2021-11-27: qty 60, 30d supply, fill #4
  Filled 2021-12-27: qty 60, 30d supply, fill #5
  Filled 2022-01-31: qty 60, 30d supply, fill #6
  Filled 2022-04-08: qty 60, 30d supply, fill #7
  Filled 2022-05-06: qty 60, 30d supply, fill #8
  Filled 2022-06-07: qty 60, 30d supply, fill #9

## 2021-06-19 ENCOUNTER — Ambulatory Visit (INDEPENDENT_AMBULATORY_CARE_PROVIDER_SITE_OTHER): Payer: Medicaid Other

## 2021-06-19 DIAGNOSIS — I219 Acute myocardial infarction, unspecified: Secondary | ICD-10-CM

## 2021-06-20 LAB — CUP PACEART REMOTE DEVICE CHECK
Battery Remaining Percentage: 74 %
Date Time Interrogation Session: 20220622135700
Implantable Lead Implant Date: 20200219
Implantable Lead Location: 753862
Implantable Lead Model: 3501
Implantable Lead Serial Number: 168743
Implantable Pulse Generator Implant Date: 20200219
Pulse Gen Serial Number: 257847

## 2021-06-22 ENCOUNTER — Other Ambulatory Visit (HOSPITAL_COMMUNITY): Payer: Self-pay

## 2021-06-25 ENCOUNTER — Other Ambulatory Visit: Payer: Self-pay

## 2021-06-25 ENCOUNTER — Ambulatory Visit (HOSPITAL_COMMUNITY)
Admission: RE | Admit: 2021-06-25 | Discharge: 2021-06-25 | Disposition: A | Discharge status: Home / Self Care | Payer: Medicaid Other | Source: Ambulatory Visit | Attending: Internal Medicine | Admitting: Internal Medicine

## 2021-06-25 ENCOUNTER — Other Ambulatory Visit (HOSPITAL_COMMUNITY): Payer: Self-pay

## 2021-06-25 ENCOUNTER — Encounter (HOSPITAL_COMMUNITY): Payer: Self-pay | Admitting: Internal Medicine

## 2021-06-25 VITALS — BP 100/60 | HR 78 | Wt 317.2 lb

## 2021-06-25 DIAGNOSIS — I255 Ischemic cardiomyopathy: Secondary | ICD-10-CM | POA: Diagnosis not present

## 2021-06-25 DIAGNOSIS — Z955 Presence of coronary angioplasty implant and graft: Secondary | ICD-10-CM | POA: Insufficient documentation

## 2021-06-25 DIAGNOSIS — I2510 Atherosclerotic heart disease of native coronary artery without angina pectoris: Secondary | ICD-10-CM | POA: Diagnosis not present

## 2021-06-25 DIAGNOSIS — J45909 Unspecified asthma, uncomplicated: Secondary | ICD-10-CM | POA: Insufficient documentation

## 2021-06-25 DIAGNOSIS — Z7982 Long term (current) use of aspirin: Secondary | ICD-10-CM | POA: Diagnosis not present

## 2021-06-25 DIAGNOSIS — Z79899 Other long term (current) drug therapy: Secondary | ICD-10-CM | POA: Diagnosis not present

## 2021-06-25 DIAGNOSIS — I5022 Chronic systolic (congestive) heart failure: Secondary | ICD-10-CM | POA: Diagnosis present

## 2021-06-25 DIAGNOSIS — E669 Obesity, unspecified: Secondary | ICD-10-CM | POA: Insufficient documentation

## 2021-06-25 DIAGNOSIS — Z6839 Body mass index (BMI) 39.0-39.9, adult: Secondary | ICD-10-CM | POA: Insufficient documentation

## 2021-06-25 DIAGNOSIS — Z7984 Long term (current) use of oral hypoglycemic drugs: Secondary | ICD-10-CM | POA: Insufficient documentation

## 2021-06-25 DIAGNOSIS — Z7902 Long term (current) use of antithrombotics/antiplatelets: Secondary | ICD-10-CM | POA: Insufficient documentation

## 2021-06-25 DIAGNOSIS — Z87891 Personal history of nicotine dependence: Secondary | ICD-10-CM | POA: Diagnosis not present

## 2021-06-25 DIAGNOSIS — I252 Old myocardial infarction: Secondary | ICD-10-CM | POA: Diagnosis not present

## 2021-06-25 LAB — COMPREHENSIVE METABOLIC PANEL
ALT: 23 U/L (ref 0–44)
AST: 17 U/L (ref 15–41)
Albumin: 3.7 g/dL (ref 3.5–5.0)
Alkaline Phosphatase: 40 U/L (ref 38–126)
Anion gap: 8 (ref 5–15)
BUN: 10 mg/dL (ref 6–20)
CO2: 23 mmol/L (ref 22–32)
Calcium: 9 mg/dL (ref 8.9–10.3)
Chloride: 106 mmol/L (ref 98–111)
Creatinine, Ser: 0.75 mg/dL (ref 0.61–1.24)
GFR, Estimated: >60 mL/min (ref >60)
Glucose, Bld: 106 mg/dL — ABNORMAL HIGH (ref 70–99)
Potassium: 4 mmol/L (ref 3.5–5.1)
Sodium: 137 mmol/L (ref 135–145)
Total Bilirubin: 0.8 mg/dL (ref 0.3–1.2)
Total Protein: 6.9 g/dL (ref 6.5–8.1)

## 2021-06-25 LAB — CBC
HCT: 45.5 % (ref 39.0–52.0)
Hemoglobin: 15.4 g/dL (ref 13.0–17.0)
MCH: 29.2 pg (ref 26.0–34.0)
MCHC: 33.8 g/dL (ref 30.0–36.0)
MCV: 86.3 fL (ref 80.0–100.0)
Platelets: 279 10*3/uL (ref 150–400)
RBC: 5.27 MIL/uL (ref 4.22–5.81)
RDW: 13.2 % (ref 11.5–15.5)
WBC: 6.5 10*3/uL (ref 4.0–10.5)
nRBC: 0 % (ref 0.0–0.2)

## 2021-06-25 LAB — BRAIN NATRIURETIC PEPTIDE: B Natriuretic Peptide: 107.3 pg/mL — ABNORMAL HIGH (ref 0.0–100.0)

## 2021-06-25 MED ORDER — DAPAGLIFLOZIN PROPANEDIOL 10 MG PO TABS
10.0000 mg | ORAL_TABLET | Freq: Every day | ORAL | 11 refills | Status: DC
  Filled 2021-06-25: qty 30, 30d supply, fill #0
  Filled 2021-07-23 – 2021-07-24 (×2): qty 30, 30d supply, fill #1
  Filled 2021-08-17: qty 30, 30d supply, fill #0
  Filled 2021-08-17: qty 30, 30d supply, fill #2
  Filled 2021-09-17: qty 30, 30d supply, fill #1
  Filled 2021-10-22: qty 30, 30d supply, fill #2
  Filled 2021-11-27: qty 30, 30d supply, fill #3
  Filled 2021-12-27: qty 30, 30d supply, fill #4
  Filled 2022-01-31: qty 30, 30d supply, fill #5
  Filled 2022-03-04: qty 30, 30d supply, fill #6
  Filled 2022-04-08: qty 30, 30d supply, fill #7
  Filled 2022-05-06: qty 30, 30d supply, fill #8
  Filled 2022-06-07: qty 30, 30d supply, fill #9

## 2021-06-25 NOTE — Addendum Note (Signed)
Encounter addended by: Noralee Space, RN on: 06/25/2021 12:06 PM  Actions taken: Order list changed, Diagnosis association updated, Pharmacy for encounter modified

## 2021-06-25 NOTE — Addendum Note (Signed)
Encounter addended by: Noralee Space, RN on: 06/25/2021 12:03 PM  Actions taken: Order list changed, Diagnosis association updated, Clinical Note Signed, Charge Capture section accepted

## 2021-06-25 NOTE — Patient Instructions (Signed)
Labs done today, your results will be available in MyChart, we will contact you for abnormal readings.  Please call our office in  December 2022 to schedule your follow up appointment  If you have any questions or concerns before your next appointment please send [place] a message through [employer] or call our office at [phone].    TO LEAVE A MESSAGE FOR THE NURSE SELECT OPTION 2, PLEASE LEAVE A MESSAGE INCLUDING: YOUR NAME DATE OF BIRTH CALL BACK NUMBER REASON FOR CALL**this is important as we prioritize the call backs  YOU WILL RECEIVE A CALL BACK THE SAME DAY AS LONG AS YOU CALL BEFORE 4:00 PM  At the [facility], you and your health needs are our priority. As part of our continuing mission to provide you with exceptional heart care, we have created designated Provider Care Teams. These Care Teams include your primary Cardiologist (physician) and Advanced Practice Providers (APPs- Physician Assistants and Nurse Practitioners) who all work together to provide you with the care you need, when you need it.   You may see any of the following providers on your designated Care Team at your next follow up: Dr Arvilla Meres Dr Marca Ancona Dr Brandon Melnick, NP Robbie Lis, [place] Mikki Santee Karle Plumber, PharmD   Please be sure to bring in all your medications bottles to every appointment.

## 2021-06-25 NOTE — Progress Notes (Addendum)
Advanced [facility] Clinic Note  PCP: [facility] EP: Dr. Ladona Ridgel HF Cardiologist: Dr. Gala Romney  HPI: Sergio Hicks is a 40 y.o. 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   Admitted 01/18/2018 with OOH anterior MI and severe ICM. Taken urgently for cath which showed 100% LAD occlusion as below. ECHO 01/18/18 LVEF 30%. Initially treated medically but developed persistent CP so taken for PCI and pt is s/p DES to proximal/mid LAD 01/20/18.  S/p SQ [employer] ICD with Dr. Ladona Ridgel on 02/17/19.  Last visit doing okay but still getting easily fatigued seems to have started after covid.    Mostly good days but some days very tired which is how he has felt since having covid.  Hasn't had much shortness of breath.  Taking his dog for a walk in the heat he will have some mild shortness of breath.  Denies chest pain, PND.  Not going to the gym much currently but plans to start back soon.  Took his PRN lasix at the beginning of the month.  Weighing himself daily has been gradually going up due to lack of activity and diet eating a lot of starches.    Cardiac Studies: Echo 07/2018: EF 25-30% Echo 4/19: EF 30-35%.  Echo 01/18/2018: EF 30-35%  Echo 01/28/20 EF 35-40% RV normal. Personally reviewed and I felt 30-35%. Echo 03/2021 EF 30-35%, G2DD,  Review of systems complete and found to be negative unless listed in HPI.   SH:  Social History   Socioeconomic History   Marital status: Single    Spouse name: Not on file   Number of children: Not on file   Years of education: Not on file   Highest education level: Not on file  Occupational History   Occupation: chef  Tobacco Use   Smoking status: Former    Packs/day: 0.00    Pack years: 0.00    Types: Cigarettes   Smokeless tobacco: Never   Tobacco comments:    2019  Vaping Use   Vaping Use: Never used  Substance and  Sexual Activity   Alcohol use: Not Currently    Alcohol/week: 1.0 standard drink    Types: 1 Glasses of wine per week    Comment: quit in 2021   Drug use: Never   Sexual activity: Not on file  Other Topics Concern   Not on file  Social History Narrative   Not on file   Social Determinants of Health   Financial Resource Strain: Not on file  Food Insecurity: Not on file  Transportation Needs: Not on file  Physical Activity: Not on file  Stress: Not on file  Social Connections: Not on file  Intimate Partner Violence: Not on file   FH:  Family History  Adopted: Yes   Past Medical History:  Diagnosis Date   Alcohol abuse    Asthma    Chronic systolic CHF (congestive heart failure) (HCC)    Coronary artery disease    a. OOH/late presenting MI 2019 s/p DES to prox/mid LAD.   Ischemic cardiomyopathy    Mild sleep apnea    Myocardial infarction (HCC)    Tobacco abuse    Current Outpatient Medications  Medication Sig Dispense Refill   albuterol (PROVENTIL HFA;VENTOLIN HFA) 108 (90 Base) MCG/ACT inhaler Inhale 1-2 puffs into the lungs every 6 (six) hours as needed for wheezing or  shortness of breath.     aspirin EC 81 MG EC tablet Take 1 tablet (81 mg total) by mouth daily. 30 tablet 6   atorvastatin (LIPITOR) 80 MG tablet TAKE 1 TABLET BY MOUTH DAILY 30 tablet 11   carvedilol (COREG) 6.25 MG tablet TAKE 1 and 1/2  TABLETS BY MOUTH 2 TIMES DAILY 90 tablet 3   clopidogrel (PLAVIX) 75 MG tablet TAKE 1 TABLET BY MOUTH ONCE DAILY WITH BREAKFAST 90 tablet 3   dapagliflozin propanediol (FARXIGA) 10 MG TABS tablet TAKE 1 TABLET BY MOUTH ONCE DAILY BEFORE BREAKFAST 30 tablet 6   furosemide (LASIX) 40 MG tablet Take 1 tablet (40 mg total) by mouth as needed. 30 tablet 3   loratadine (CLARITIN) 10 MG tablet Take 10 mg by mouth daily as needed for allergies.     Multiple Vitamin (MULTIVITAMIN) tablet Take 1 tablet by mouth daily.     nitroGLYCERIN (NITROSTAT) 0.4 MG SL tablet Place 1  tablet (0.4 mg total) under the tongue every 5 (five) minutes as needed for chest pain. 60 tablet 0   potassium chloride SA (KLOR-CON) 20 MEQ tablet Take 2 tablets (40 mEq total) by mouth daily as needed (when taking furosemide). 30 tablet 3   sacubitril-valsartan (ENTRESTO) 49-51 MG TAKE 1 TABLET BY MOUTH 2 (TWO) TIMES DAILY. 180 tablet 3   spironolactone (ALDACTONE) 25 MG tablet TAKE 1 TABLET BY MOUTH ONCE A DAY 30 tablet 3   No current facility-administered medications for this encounter.   Vitals:   06/25/21 1104  BP: 100/60  Pulse: 78  SpO2: 96%  Weight: (!) 143.9 kg (317 lb 3.2 oz)   Wt Readings from Last 3 Encounters:  06/25/21 (!) 143.9 kg (317 lb 3.2 oz)  05/31/21 133.4 kg (294 lb)  03/31/21 133.4 kg (294 lb)    PHYSICAL EXAM: General:  NAD. No resp difficulty HEENT: Normal Neck: Supple. No JVD. Carotids 2+ bilat; no bruits. No lymphadenopathy or thryomegaly appreciated. Cor: PMI nondisplaced. Regular rate & rhythm. No rubs, gallops or murmurs. Lungs: Clear Abdomen: Obese, soft, nontender, nondistended. No hepatosplenomegaly. No bruits or masses. Good bowel sounds. Extremities: No cyanosis, clubbing, rash, edema Neuro: alert & oriented x 3, cranial nerves grossly intact. Moves all 4 extremities w/o difficulty. Affect pleasant.  ECG: SR 66 bpm, slower than last tracing (personally reviewed).  ASSESSMENT & PLAN: 1. Chronic Systolic Heart Failure due to ICM - Echo (1/19): EF 30-35%.  - Echo (4/19): EF 4/19 30-35%.  - Echo (8/19): EF 25-30%  - Echo (1/21): EF 35-40% RV ok. (I thought 30-35%) - S/p SQ [employer] ICD 12/18/19. - Stable NYHA II, functional status confounded by COVID illness. Volume status good today, weight trending up slowly but not fluid weight - Continue lasix 40 mg prn. Does not need it daily. - Continue Entresto 49/51 mg bid.  - Continue carvedilol  9.375 mg bid. - Continue spironolactone 25 mg daily. - Continue Farxiga 10 mg daily. - BMET  today. - Repeat Echo.  2. CAD - cath 1/20 with total occlusion of LAD with R to L collaterals.  - 01/20/19 S/P DES to proximal/mid LAD. - No s/s of angina - Continue high dose statin, aspirin, and plavix.  3. Obesity - Body mass index is 39.65 kg/m. - Encouraged continued physical activity in gym as able, and portion control.   Angelita Ingles, MD 11:13 AM   Patient seen and examined with the above-signed Advanced Practice Provider and/or Housestaff. I personally reviewed laboratory data,  imaging studies and relevant notes. I independently examined the patient and formulated the important aspects of the plan. I have edited the note to reflect any of my changes or salient points. I have personally discussed the plan with the patient and/or family.  Overall stable. NYHA II- early III in setting of fatigue post-COVID and weight gain. Volume ok. No angina   General:  Well appearing. No resp difficulty HEENT: normal Neck: supple. no JVD. Carotids 2+ bilat; no bruits. No lymphadenopathy or thryomegaly appreciated. Cor: PMI nondisplaced. Regular rate & rhythm. No rubs, gallops or murmurs. Lungs: clear Abdomen: obese soft, nontender, nondistended. No hepatosplenomegaly. No bruits or masses. Good bowel sounds. Extremities: no cyanosis, clubbing, rash, edema Neuro: alert & orientedx3, cranial nerves grossly intact. moves all 4 extremities w/o difficulty. Affect pleasant  Stable from HF perspective. Volume ok. No angina. Recent echo stable. On good GDMT. Titration limited by low BP.   Arvilla Meres, MD  11:51 AM

## 2021-06-26 ENCOUNTER — Other Ambulatory Visit (HOSPITAL_COMMUNITY): Payer: Self-pay

## 2021-07-09 NOTE — Progress Notes (Signed)
Remote ICD transmission.   

## 2021-07-12 ENCOUNTER — Encounter (HOSPITAL_BASED_OUTPATIENT_CLINIC_OR_DEPARTMENT_OTHER): Payer: Medicaid Other | Admitting: Cardiology

## 2021-07-23 ENCOUNTER — Other Ambulatory Visit (HOSPITAL_COMMUNITY): Payer: Self-pay

## 2021-07-24 ENCOUNTER — Other Ambulatory Visit (HOSPITAL_COMMUNITY): Payer: Self-pay

## 2021-08-17 ENCOUNTER — Other Ambulatory Visit (HOSPITAL_COMMUNITY): Payer: Self-pay | Admitting: Internal Medicine

## 2021-08-17 ENCOUNTER — Other Ambulatory Visit (HOSPITAL_COMMUNITY): Payer: Self-pay

## 2021-08-17 MED ORDER — SPIRONOLACTONE 25 MG PO TABS
ORAL_TABLET | Freq: Every day | ORAL | 11 refills | Status: DC
  Filled 2021-08-17 – 2021-08-29 (×2): qty 30, 30d supply, fill #0
  Filled 2021-09-17 – 2021-09-18 (×2): qty 30, 30d supply, fill #1
  Filled 2021-10-22: qty 30, 30d supply, fill #2
  Filled 2021-11-27: qty 30, 30d supply, fill #3
  Filled 2021-12-27: qty 30, 30d supply, fill #4
  Filled 2022-01-31: qty 30, 30d supply, fill #5
  Filled 2022-03-04: qty 30, 30d supply, fill #6
  Filled 2022-04-08: qty 30, 30d supply, fill #7
  Filled 2022-05-06: qty 30, 30d supply, fill #8
  Filled 2022-06-07: qty 30, 30d supply, fill #9
  Filled 2022-07-16: qty 30, 30d supply, fill #10
  Filled 2022-08-08: qty 30, 30d supply, fill #11

## 2021-08-25 ENCOUNTER — Other Ambulatory Visit (HOSPITAL_COMMUNITY): Payer: Self-pay

## 2021-08-29 ENCOUNTER — Other Ambulatory Visit (HOSPITAL_COMMUNITY): Payer: Self-pay

## 2021-09-16 ENCOUNTER — Ambulatory Visit (HOSPITAL_BASED_OUTPATIENT_CLINIC_OR_DEPARTMENT_OTHER): Payer: Medicaid Other | Attending: Cardiology | Admitting: Cardiology

## 2021-09-16 ENCOUNTER — Other Ambulatory Visit: Payer: Self-pay

## 2021-09-16 DIAGNOSIS — G4733 Obstructive sleep apnea (adult) (pediatric): Secondary | ICD-10-CM | POA: Diagnosis present

## 2021-09-17 ENCOUNTER — Other Ambulatory Visit (HOSPITAL_COMMUNITY): Payer: Self-pay

## 2021-09-17 ENCOUNTER — Other Ambulatory Visit (HOSPITAL_COMMUNITY): Payer: Self-pay | Admitting: Internal Medicine

## 2021-09-18 ENCOUNTER — Ambulatory Visit (INDEPENDENT_AMBULATORY_CARE_PROVIDER_SITE_OTHER): Payer: Self-pay

## 2021-09-18 ENCOUNTER — Other Ambulatory Visit (HOSPITAL_COMMUNITY): Payer: Self-pay

## 2021-09-18 DIAGNOSIS — I255 Ischemic cardiomyopathy: Secondary | ICD-10-CM

## 2021-09-18 LAB — CUP PACEART REMOTE DEVICE CHECK
Battery Remaining Percentage: 72 %
Date Time Interrogation Session: 20220919070200
Implantable Lead Implant Date: 20200219
Implantable Lead Location: 753862
Implantable Lead Model: 3501
Implantable Lead Serial Number: 168743
Implantable Pulse Generator Implant Date: 20200219
Pulse Gen Serial Number: 257847

## 2021-09-19 ENCOUNTER — Other Ambulatory Visit (HOSPITAL_COMMUNITY): Payer: Self-pay

## 2021-09-19 MED ORDER — CLOPIDOGREL BISULFATE 75 MG PO TABS
ORAL_TABLET | Freq: Every day | ORAL | 3 refills | Status: DC
  Filled 2021-09-19: qty 90, 90d supply, fill #0
  Filled 2021-11-27: qty 90, 90d supply, fill #1
  Filled 2022-04-08: qty 90, 90d supply, fill #2
  Filled 2022-07-05: qty 90, 90d supply, fill #3

## 2021-09-21 ENCOUNTER — Other Ambulatory Visit (HOSPITAL_COMMUNITY): Payer: Self-pay

## 2021-09-24 NOTE — Procedures (Signed)
    Patient Name: Sergio Hicks, Sergio Hicks Date: 09/16/2021 Gender: Male D.O.B: 07-06-1981 Age (years): 40 Referring Provider: Bevelyn Buckles Bensimhon Height (inches): 75 Interpreting Physician: Armanda Magic MD, ABSM Weight (lbs): 294 RPSGT: Rolene Arbour BMI: 37 MRN: [id number] Neck Size: 17.00  CLINICAL INFORMATION The patient is referred for a CPAP titration to treat sleep apnea.  SLEEP STUDY TECHNIQUE As per the AASM Manual for the Scoring of Sleep and Associated Events v2.3 (April 2016) with a hypopnea requiring 4% desaturations.  The channels recorded and monitored were frontal, central and occipital EEG, electrooculogram (EOG), submentalis EMG (chin), nasal and oral airflow, thoracic and abdominal wall motion, anterior tibialis EMG, snore microphone, electrocardiogram, and pulse oximetry. Continuous positive airway pressure (CPAP) was initiated at the beginning of the study and titrated to treat sleep-disordered breathing.  MEDICATIONS Medications self-administered by patient taken the night of the study : ATORVASTATIN, CARVEDILOL, ENTRESTO  TECHNICIAN COMMENTS Comments added by technician: none Comments added by scorer: N/A  RESPIRATORY PARAMETERS Optimal PAP Pressure (cm): 13  AHI at Optimal Pressure (/hr):0 Overall Minimal O2 (%):89.0  Supine % at Optimal Pressure (%):100 Minimal O2 at Optimal Pressure (%): 94.0   SLEEP ARCHITECTURE The study was initiated at 10:40:54 PM and ended at 4:51:38 AM.  Sleep onset time was 39.4 minutes and the sleep efficiency was 86.0%. The total sleep time was 319 minutes.  The patient spent 1.1% of the night in stage N1 sleep, 79.5% in stage N2 sleep, 0.3% in stage N3 and 19.1% in REM.Stage REM latency was 64.5 minutes  Wake after sleep onset was 12.3. Alpha intrusion was absent. Supine sleep was 100.00%.  CARDIAC DATA The 2 lead EKG demonstrated sinus rhythm. The mean heart rate was 55.4 beats per minute. Other EKG findings include:  None.  LEG MOVEMENT DATA The total Periodic Limb Movements of Sleep (PLMS) were 0. The PLMS index was 0.0. A PLMS index of <15 is considered normal in adults.  IMPRESSIONS - The optimal PAP pressure was 13 cm of water. - Mild oxygen desaturations were observed during this titration (min O2 = 89.0%). - The patient snored with soft snoring volume during this titration study. - No cardiac abnormalities were observed during this study. - Clinically significant periodic limb movements were not noted during this study. Arousals associated with PLMs were rare.  DIAGNOSIS - Obstructive Sleep Apnea (G47.33)  RECOMMENDATIONS - Trial of CPAP therapy on 13 cm H2O with a Medium size Fisher&Paykel Full Face Mask Simplus mask and heated humidification. - Avoid alcohol, sedatives and other CNS depressants that may worsen sleep apnea and disrupt normal sleep architecture. - Sleep hygiene should be reviewed to assess factors that may improve sleep quality. - Weight management and regular exercise should be initiated or continued. - Return to Sleep Center for re-evaluation after 4 weeks of therapy  [Electronically signed] 09/24/2021 06:50 PM  Armanda Magic MD, ABSM Diplomate, American Board of Sleep Medicine

## 2021-09-25 NOTE — Progress Notes (Signed)
Remote ICD transmission.   

## 2021-09-26 ENCOUNTER — Other Ambulatory Visit (HOSPITAL_COMMUNITY): Payer: Self-pay

## 2021-10-02 ENCOUNTER — Telehealth: Payer: Self-pay | Admitting: *Deleted

## 2021-10-02 NOTE — Telephone Encounter (Signed)
The patient has been notified of the result and verbalized understanding.  All questions (if any) were answered. Sergio Hicks, CMA 10/02/2021 11:25 AM    Upon patient request DME selection is [facility] Patient understands he will be contacted by [facility] to set up his cpap. Patient understands to call if [facility] does not contact him with new setup in a timely manner. Patient understands they will be called once confirmation has been received from [facility] that they have received their new machine to schedule 10 week follow up appointment.   [facility] notified of new cpap order  Please add to airview Patient was grateful for the call and thanked me

## 2021-10-02 NOTE — Telephone Encounter (Signed)
-----   Message from Quintella Reichert, MD sent at 09/24/2021  6:52 PM EDT ----- Please let patient know that they had a successful PAP titration and let DME know that orders are in EPIC.  Please set up 6 week OV with me.

## 2021-10-22 ENCOUNTER — Other Ambulatory Visit (HOSPITAL_COMMUNITY): Payer: Self-pay | Admitting: Internal Medicine

## 2021-10-22 ENCOUNTER — Other Ambulatory Visit (HOSPITAL_COMMUNITY): Payer: Self-pay

## 2021-10-22 MED ORDER — CARVEDILOL 6.25 MG PO TABS
ORAL_TABLET | ORAL | 3 refills | Status: DC
  Filled 2021-10-22: qty 90, 30d supply, fill #0
  Filled 2021-11-27: qty 90, 30d supply, fill #1
  Filled 2022-03-04: qty 90, 30d supply, fill #2
  Filled 2022-04-08: qty 90, 30d supply, fill #3

## 2021-10-24 ENCOUNTER — Other Ambulatory Visit (HOSPITAL_COMMUNITY): Payer: Self-pay

## 2021-10-24 NOTE — Telephone Encounter (Signed)
Reached out to Joy C at [facility] to get the study dates retro activated and she states [employer] does not do retro auth's if the study falls outside the valid dates. Patient was scheduled from original date and rescheduled outside of his valid dates.

## 2021-11-27 ENCOUNTER — Other Ambulatory Visit (HOSPITAL_COMMUNITY): Payer: Self-pay

## 2021-12-18 ENCOUNTER — Ambulatory Visit (INDEPENDENT_AMBULATORY_CARE_PROVIDER_SITE_OTHER): Payer: Self-pay

## 2021-12-18 DIAGNOSIS — I255 Ischemic cardiomyopathy: Secondary | ICD-10-CM

## 2021-12-24 LAB — CUP PACEART REMOTE DEVICE CHECK
Battery Remaining Percentage: 69 %
Date Time Interrogation Session: 20221224073400
Implantable Lead Implant Date: 20200219
Implantable Lead Location: 753862
Implantable Lead Model: 3501
Implantable Lead Serial Number: 168743
Implantable Pulse Generator Implant Date: 20200219
Pulse Gen Serial Number: 257847

## 2021-12-27 ENCOUNTER — Other Ambulatory Visit (HOSPITAL_COMMUNITY): Payer: Self-pay

## 2021-12-27 NOTE — Progress Notes (Signed)
Remote ICD transmission.   

## 2022-02-05 ENCOUNTER — Other Ambulatory Visit (HOSPITAL_COMMUNITY): Payer: Self-pay

## 2022-02-05 IMAGING — CR DG CHEST 2V
2 series · 2 of 2 positions shown · non-contrast
Comparison: Portable chest 08/14/2020 and earlier.

CLINICAL DATA: 40-year-old male with left side chest pain,
shortness of breath and tingling.

EXAM:
CHEST - 2 VIEW

[chest pa]
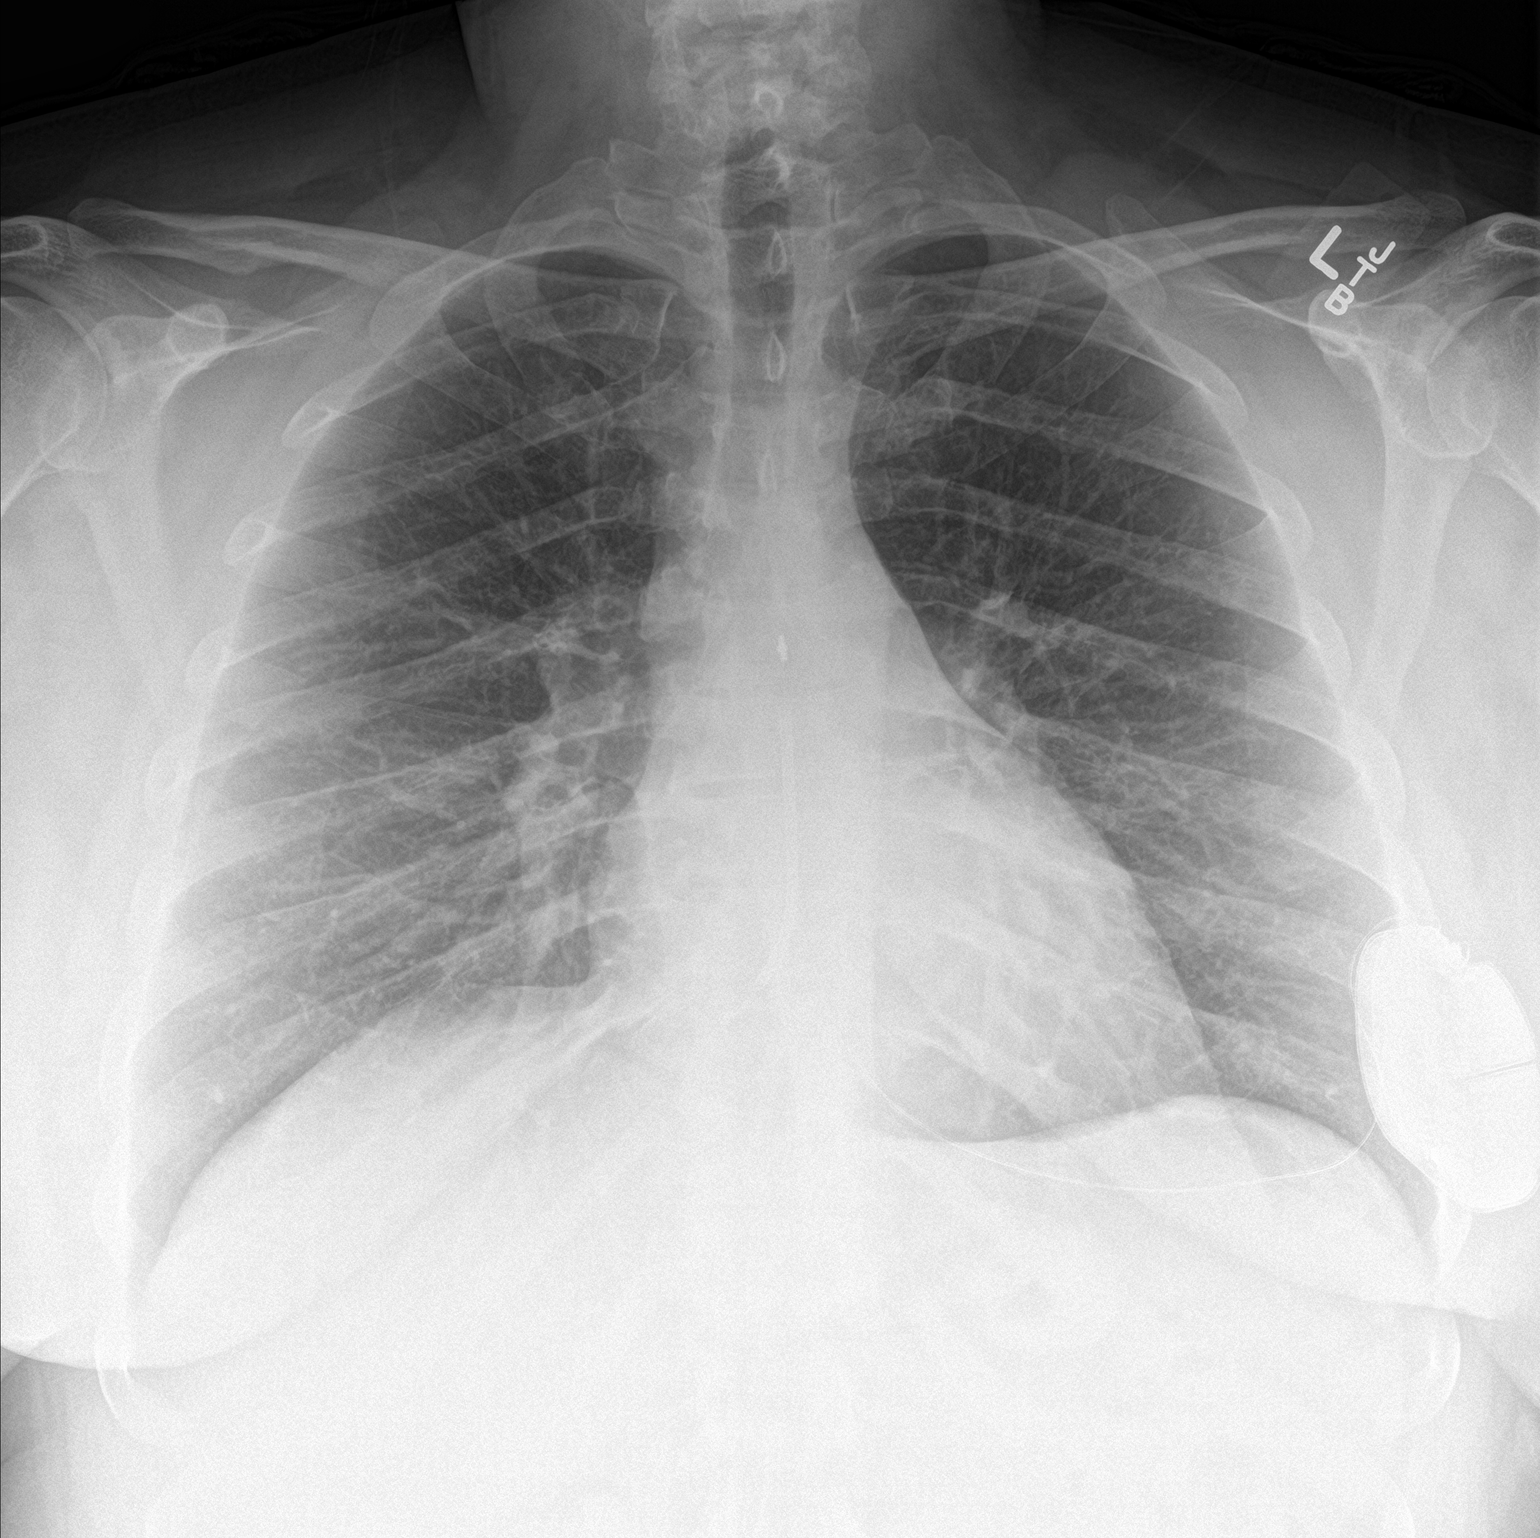

[chest lat]
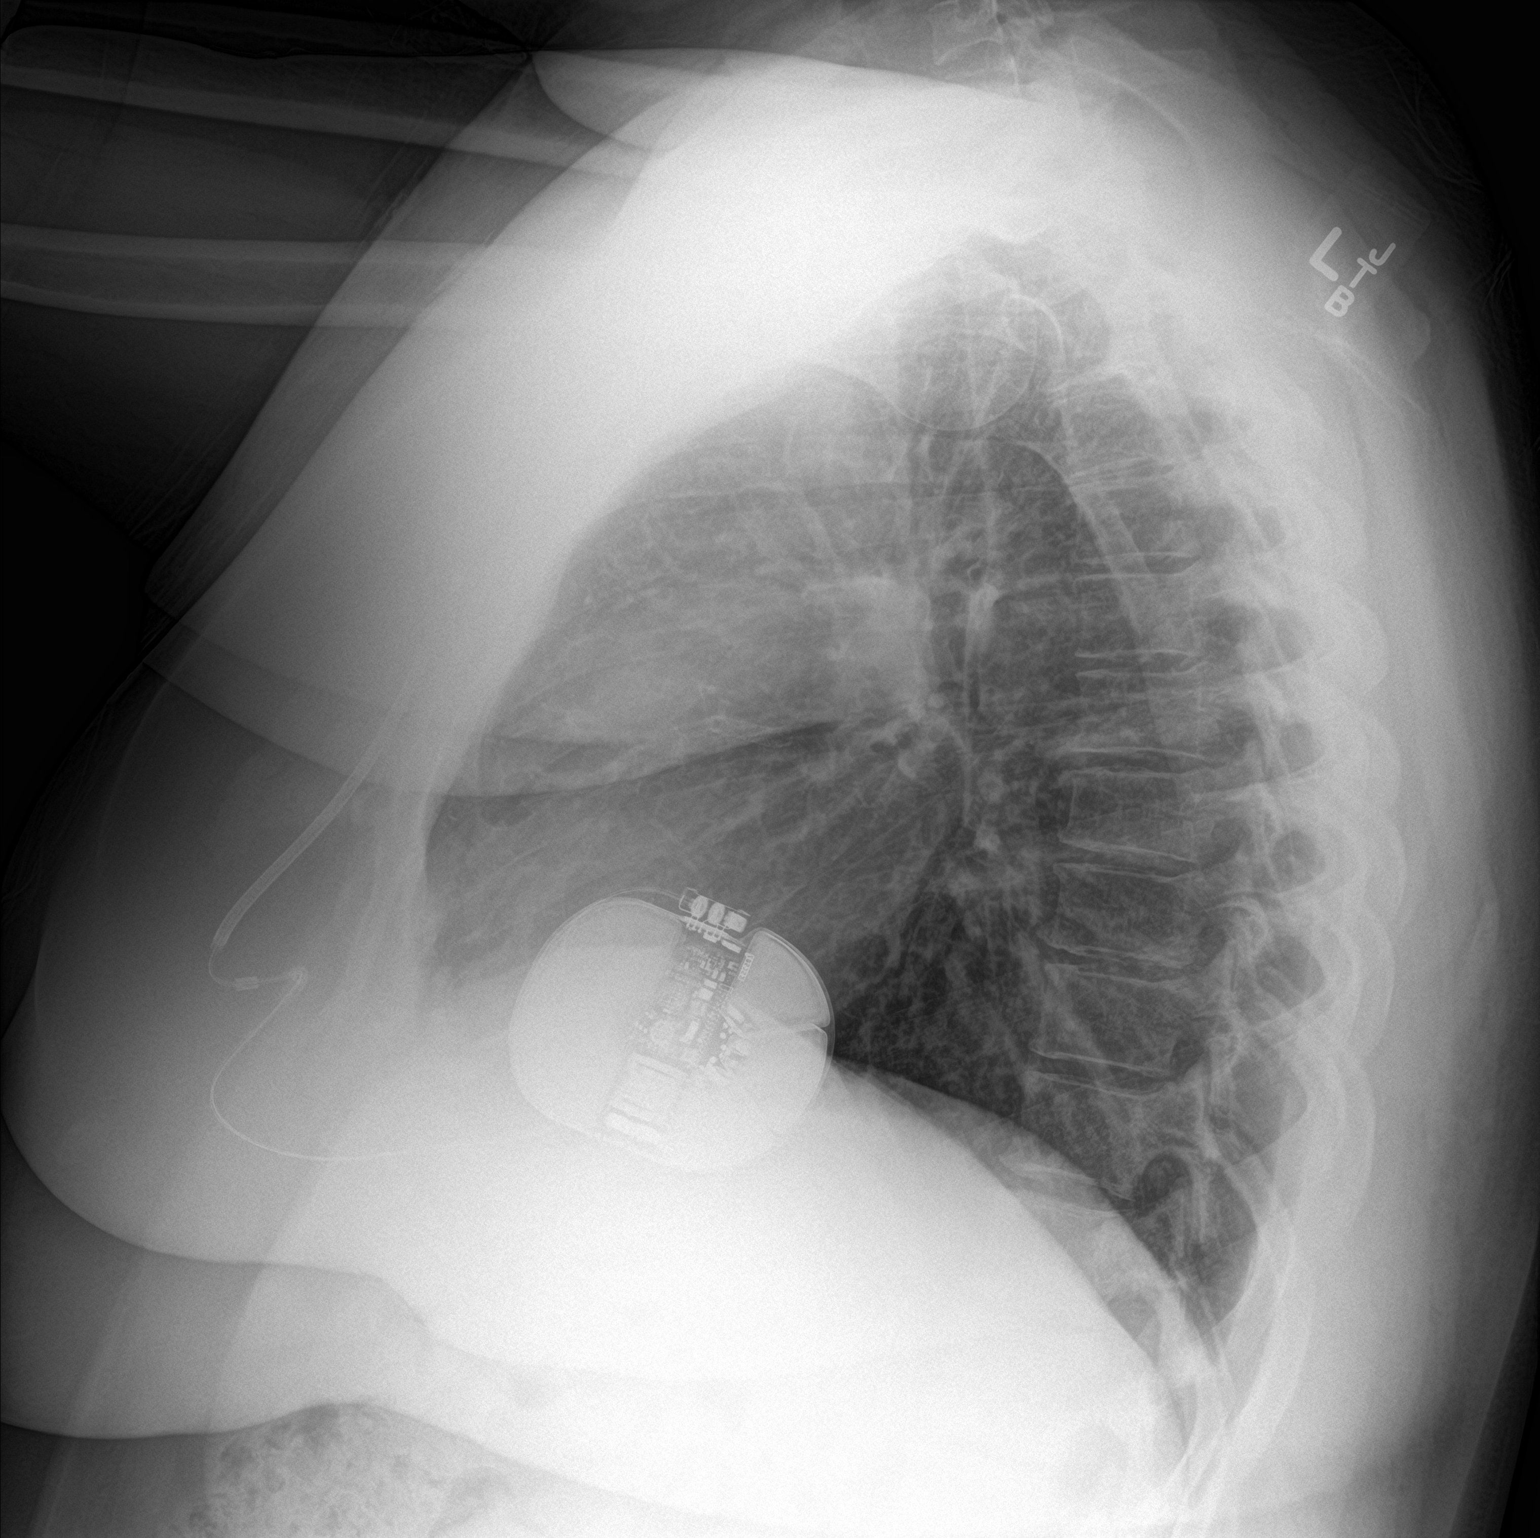

[2 of 2 positions shown; findings below may reference images not displayed]

FINDINGS: New left chest generator device associated with subcutaneous
anterior AICD. Mediastinal contours remain normal. Visualized
tracheal air column is within normal limits. Both lungs appear
clear. No pneumothorax or pleural effusion.

No acute osseous abnormality identified. Negative visible bowel gas.
IMPRESSION: 1.  No acute cardiopulmonary abnormality.
2. Anterior subcutaneous AICD placed since last year.

## 2022-02-12 ENCOUNTER — Telehealth: Payer: Self-pay | Admitting: *Deleted

## 2022-02-12 NOTE — Telephone Encounter (Signed)
t on the line reaching out in regard to his cpap machine.. pt states that he reached out to the company that ships out the machine but was advised to contact you

## 2022-02-12 NOTE — Telephone Encounter (Signed)
Reached out to patient and his dme [facility]. They will contact him asap. Patient is agreeable to treatment.

## 2022-03-01 ENCOUNTER — Telehealth (HOSPITAL_COMMUNITY): Payer: Self-pay | Admitting: *Deleted

## 2022-03-01 NOTE — Telephone Encounter (Signed)
Pt left vm stating he needed a call back about scheduling a f/u and having some paperwork filled out for medicaid. Called pt back no answer/left vm for return call.  ?

## 2022-03-04 ENCOUNTER — Other Ambulatory Visit (HOSPITAL_COMMUNITY): Payer: Self-pay

## 2022-03-05 ENCOUNTER — Other Ambulatory Visit (HOSPITAL_COMMUNITY): Payer: Self-pay

## 2022-03-06 ENCOUNTER — Ambulatory Visit (HOSPITAL_COMMUNITY)
Admission: RE | Admit: 2022-03-06 | Discharge: 2022-03-06 | Disposition: A | Discharge status: Home / Self Care | Payer: Medicaid Other | Source: Ambulatory Visit | Attending: Internal Medicine | Admitting: Internal Medicine

## 2022-03-06 ENCOUNTER — Encounter (HOSPITAL_COMMUNITY): Payer: Self-pay | Admitting: Internal Medicine

## 2022-03-06 ENCOUNTER — Other Ambulatory Visit: Payer: Self-pay

## 2022-03-06 ENCOUNTER — Other Ambulatory Visit (HOSPITAL_COMMUNITY): Payer: Self-pay

## 2022-03-06 DIAGNOSIS — E669 Obesity, unspecified: Secondary | ICD-10-CM | POA: Insufficient documentation

## 2022-03-06 DIAGNOSIS — G4733 Obstructive sleep apnea (adult) (pediatric): Secondary | ICD-10-CM | POA: Diagnosis not present

## 2022-03-06 DIAGNOSIS — Z8616 Personal history of COVID-19: Secondary | ICD-10-CM | POA: Insufficient documentation

## 2022-03-06 DIAGNOSIS — F32A Depression, unspecified: Secondary | ICD-10-CM | POA: Insufficient documentation

## 2022-03-06 DIAGNOSIS — I252 Old myocardial infarction: Secondary | ICD-10-CM | POA: Diagnosis not present

## 2022-03-06 DIAGNOSIS — F1010 Alcohol abuse, uncomplicated: Secondary | ICD-10-CM | POA: Diagnosis not present

## 2022-03-06 DIAGNOSIS — I5022 Chronic systolic (congestive) heart failure: Secondary | ICD-10-CM | POA: Insufficient documentation

## 2022-03-06 DIAGNOSIS — Z6838 Body mass index (BMI) 38.0-38.9, adult: Secondary | ICD-10-CM | POA: Diagnosis not present

## 2022-03-06 DIAGNOSIS — Z955 Presence of coronary angioplasty implant and graft: Secondary | ICD-10-CM | POA: Insufficient documentation

## 2022-03-06 DIAGNOSIS — I2510 Atherosclerotic heart disease of native coronary artery without angina pectoris: Secondary | ICD-10-CM | POA: Diagnosis not present

## 2022-03-06 LAB — BASIC METABOLIC PANEL
Anion gap: 8 (ref 5–15)
BUN: 9 mg/dL (ref 6–20)
CO2: 24 mmol/L (ref 22–32)
Calcium: 9.5 mg/dL (ref 8.9–10.3)
Chloride: 105 mmol/L (ref 98–111)
Creatinine, Ser: 1 mg/dL (ref 0.61–1.24)
GFR, Estimated: >60 mL/min (ref >60)
Glucose, Bld: 90 mg/dL (ref 70–99)
Potassium: 3.9 mmol/L (ref 3.5–5.1)
Sodium: 137 mmol/L (ref 135–145)

## 2022-03-06 LAB — BRAIN NATRIURETIC PEPTIDE: B Natriuretic Peptide: 25.8 pg/mL (ref 0.0–100.0)

## 2022-03-06 NOTE — Progress Notes (Signed)
? ?Advanced HF Clinic Note ? ?PCP: [facility] ?EP: Dr. Ladona Ridgel ?HF Cardiologist: Dr. Gala Romney ? ?HPI: ?Sergio Hicks is a 41 y.o. 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  ? ?Admitted 01/18/2018 with OOH anterior MI and severe ICM. Taken urgently for cath which showed 100% LAD occlusion as below. ECHO 01/18/18 LVEF 30%. Initially treated medically but developed persistent CP so taken for PCI and pt is s/p DES to proximal/mid LAD 01/20/18. ? ?S/p SQ [employer] ICD with Dr. Ladona Ridgel on 02/17/19. ? ?COVID + 12/21. ? ?Echo 4/22 EF 30-35%, moderately decreased LV, grade II DD, RV ok   ? ?Follow up 6/22, remained fatigued with NYHA II to early III symptoms, volume ok.  ? ?Today he returns for HF follow up. Has good and bad days. No longer working due to generalized fatigue. Not SOB walking up stairs or on flat ground. Going to gym 5 days a week for short cardio work outs, no SOB with this. Denies abnormal bleeding, palpitations, CP, dizziness, edema, or PND/Orthopnea. Appetite ok. No fever or chills. Weight at home 309 pounds. Taking all medications. Just got CPAP and plans on wearing nightly. Has not needed PRN lasix. ? ?Cardiac Studies: ?Echo 07/2018: EF 25-30% ?Echo 4/19: EF 30-35%.  ?Echo 01/18/2018: EF 30-35%  ?Echo 01/28/20 EF 35-40% RV normal. Personally reviewed and I felt 30-35%. ?Echo 03/2021 EF 30-35%, G2DD, ?  ?Review of systems complete and found to be negative unless listed in HPI.  ? ?SH:  ?Social History  ? ?Socioeconomic History  ? Marital status: Single  ?  Spouse name: Not on file  ? Number of children: Not on file  ? Years of education: Not on file  ? Highest education level: Not on file  ?Occupational History  ? Occupation: chef  ?Tobacco Use  ? Smoking status: Former  ?  Packs/day: 0.00  ?  Types: Cigarettes  ? Smokeless tobacco: Never  ? Tobacco comments:  ?  2019   ?Vaping Use  ? Vaping Use: Never used  ?Substance and Sexual Activity  ? Alcohol use: Not Currently  ?  Alcohol/week: 1.0 standard drink  ?  Types: 1 Glasses of wine per week  ?  Comment: quit in 2021  ? Drug use: Never  ? Sexual activity: Not on file  ?Other Topics Concern  ? Not on file  ?Social History Narrative  ? Not on file  ? ?Social Determinants of Health  ? ?Financial Resource Strain: Not on file  ?Food Insecurity: Not on file  ?Transportation Needs: Not on file  ?Physical Activity: Not on file  ?Stress: Not on file  ?Social Connections: Not on file  ?Intimate Partner Violence: Not on file  ? ?FH:  ?Family History  ?Adopted: Yes  ? ?Past Medical History:  ?Diagnosis Date  ? Alcohol abuse   ? Asthma   ? Chronic systolic CHF (congestive heart failure) (HCC)   ? Coronary artery disease   ? a. OOH/late presenting MI 2019 s/p DES to prox/mid LAD.  ? Ischemic cardiomyopathy   ? Mild sleep apnea   ? Myocardial infarction [facility])   ? Tobacco abuse   ? ?Current Outpatient Medications  ?Medication Sig Dispense Refill  ? albuterol (PROVENTIL HFA;VENTOLIN HFA) 108 (90 Base) MCG/ACT inhaler Inhale 1-2 puffs into the lungs every 6 (six) hours as needed for wheezing or shortness of breath.    ?  aspirin EC 81 MG EC tablet Take 1 tablet (81 mg total) by mouth daily. 30 tablet 6  ? atorvastatin (LIPITOR) 80 MG tablet TAKE 1 TABLET BY MOUTH DAILY 30 tablet 11  ? carvedilol (COREG) 6.25 MG tablet TAKE 1 and 1/2  TABLETS BY MOUTH 2 TIMES DAILY 90 tablet 3  ? clopidogrel (PLAVIX) 75 MG tablet TAKE 1 TABLET BY MOUTH ONCE DAILY WITH BREAKFAST 90 tablet 3  ? dapagliflozin propanediol (FARXIGA) 10 MG TABS tablet Take 1 tablet by mouth daily before breakfast. 30 tablet 11  ? furosemide (LASIX) 40 MG tablet Take 1 tablet (40 mg total) by mouth as needed. 30 tablet 3  ? loratadine (CLARITIN) 10 MG tablet Take 10 mg by mouth daily as needed for allergies.    ? Multiple Vitamin (MULTIVITAMIN) tablet Take 1 tablet by mouth daily.    ?  nitroGLYCERIN (NITROSTAT) 0.4 MG SL tablet Place 1 tablet (0.4 mg total) under the tongue every 5 (five) minutes as needed for chest pain. 60 tablet 0  ? potassium chloride SA (KLOR-CON) 20 MEQ tablet Take 2 tablets (40 mEq total) by mouth daily as needed (when taking furosemide). 30 tablet 3  ? sacubitril-valsartan (ENTRESTO) 49-51 MG TAKE 1 TABLET BY MOUTH 2 (TWO) TIMES DAILY. 180 tablet 3  ? spironolactone (ALDACTONE) 25 MG tablet TAKE 1 TABLET BY MOUTH ONCE A DAY 30 tablet 11  ? ?No current facility-administered medications for this encounter.  ? ?BP 110/64   Pulse 79   Wt (!) 140.2 kg (309 lb)   SpO2 94%   BMI 38.62 kg/m?  ? ?Wt Readings from Last 3 Encounters:  ?03/06/22 (!) 140.2 kg (309 lb)  ?09/16/21 133.8 kg (295 lb)  ?06/25/21 (!) 143.9 kg (317 lb 3.2 oz)  ?  ?PHYSICAL EXAM: ?General:  NAD. No resp difficulty ?HEENT: Normal ?Neck: Supple. No JVD. Carotids 2+ bilat; no bruits. No lymphadenopathy or thryomegaly appreciated. ?Cor: PMI nondisplaced. Regular rate & rhythm. No rubs, gallops or murmurs. ?Lungs: Clear ?Abdomen: Obese, nontender, nondistended. No hepatosplenomegaly. No bruits or masses. Good bowel sounds. ?Extremities: No cyanosis, clubbing, rash, edema ?Neuro: Alert & oriented x 3, cranial nerves grossly intact. Moves all 4 extremities w/o difficulty. Flat affect. ? ?Device interrogation (personally reviewed): no AF, no shocks ? ?ASSESSMENT & PLAN: ?1. Chronic Systolic Heart Failure due to ICM ?- Echo (1/19): EF 30-35%.  ?- Echo (4/19): EF 4/19 30-35%.  ?- Echo (8/19): EF 25-30%  ?- Echo (1/21): EF 35-40% RV ok. (I thought 30-35%) ?- S/p SQ [employer] ICD 12/18/19. ?- Echo (6/22): EF 30-35% ?- Stable NYHA II- early III, functional status confounded by post-COVID fatigue vs ? Physical deconditioning and depression. Volume status good today, weight trending up slowly but does not look like fluid weight. GDMT limited by BP ?- Continue Lasix 40 mg prn. Does not need it daily. ?- Continue  Entresto 49/51 mg bid.  ?- Continue carvedilol  9.375 mg bid. ?- Continue spironolactone 25 mg daily. ?- Continue Farxiga 10 mg daily. ?- Can consider Barostim as needed ?- BMET today. ? ?2. CAD ?- Cath 1/20 with total occlusion of LAD with R to L collaterals.  ?- 01/20/19 S/P DES to proximal/mid LAD. ?- No s/s of angina. ?- Continue high dose statin, aspirin, and plavix. ? ?3. Obesity ?- Body mass index is 38.62 kg/m?. ?- Encouraged continued physical activity in gym as able, and portion control. ?- Consider referral to pharmacy for GLP1RA ? ?4. OSA ?- About to start  CPAP. ? ?5. Depression ?- I think some of his symptoms are in part due to depression surrounding his chronic illness and inability to work. ?- Discussed counseling +/- medication. He is not interested in this. ? ?6. SDOH ?- In process of re-applying for Medicaid and disability ? ?Follow up in 6 months. ? ?Jacklynn Ganong, FNP ?3:47 PM  ? ?Patient seen and examined with the above-signed Advanced Practice Provider and/or Housestaff. I personally reviewed laboratory data, imaging studies and relevant notes. I independently examined the patient and formulated the important aspects of the plan. I have edited the note to reflect any of my changes or salient points. I have personally discussed the plan with the patient and/or family. ? ?Overall stable. NYHA II-III. No significant angina. Starting to exercise more. No ICD firings. Volume status ok. On good GDMT. ? ?General:  Well appearing. No resp difficulty ?HEENT: normal ?Neck: supple. no JVD. Carotids 2+ bilat; no bruits. No lymphadenopathy or thryomegaly appreciated. ?Cor: PMI nondisplaced. Regular rate & rhythm. No rubs, gallops or murmurs. ?Lungs: clear ?Abdomen: obese soft, nontender, nondistended. No hepatosplenomegaly. No bruits or masses. Good bowel sounds. ?Extremities: no cyanosis, clubbing, rash, edema ?Neuro: alert & orientedx3, cranial nerves grossly intact. moves all 4 extremities w/o  difficulty. Affect pleasant ? ?Stable. Volume stable ok. NYHA II-III. Now starting CPAP. Continue GDMT. ICD interrogated. No VT/VF.  ? ?Refer for GLP1RA injections  ? ?Arvilla Meres, MD  ?4:24 PM ? ?

## 2022-03-06 NOTE — Addendum Note (Signed)
Encounter addended by: Noralee Space, RN on: 03/06/2022 4:35 PM ? Actions taken: Order list changed, Diagnosis association updated, Clinical Note Signed, Charge Capture section accepted

## 2022-03-06 NOTE — Patient Instructions (Signed)
Labs done today, your results will be available in MyChart, we will contact you for abnormal readings. ? ?You have been referred to the Pharmacy Clinic at [facility] for weight loss medication, they will call you to discuss ? ?Your physician recommends that you schedule a follow-up appointment in: 6 months (Sept 2023), **PLEASE CALL OUR OFFICE IN July TO SCHEDULE AN APPOINTMENT ? ?If you have any questions or concerns before your next appointment please send [place] a message through [place] or call our office at [phone].   ? ?TO LEAVE A MESSAGE FOR THE NURSE SELECT OPTION 2, PLEASE LEAVE A MESSAGE INCLUDING: ?YOUR NAME ?DATE OF BIRTH ?CALL BACK NUMBER ?REASON FOR CALL**this is important as we prioritize the call backs ? ?YOU WILL RECEIVE A CALL BACK THE SAME DAY AS LONG AS YOU CALL BEFORE 4:00 PM ? ?At the [facility], you and your health needs are our priority. As part of our continuing mission to provide you with exceptional heart care, we have created designated Provider Care Teams. These Care Teams include your primary Cardiologist (physician) and Advanced Practice Providers (APPs- Physician Assistants and Nurse Practitioners) who all work together to provide you with the care you need, when you need it.  ? ?You may see any of the following providers on your designated Care Team at your next follow up: ?Dr Glori Bickers ?Dr Loralie Champagne ?Darrick Grinder, NP ?Lyda Jester, PA ?Jessica Milford,NP ?Marlyce Huge, PA ?Audry Riles, PharmD ? ? ?Please be sure to bring in all your medications bottles to every appointment.  ? ? ? ?

## 2022-03-07 ENCOUNTER — Telehealth: Payer: Self-pay | Admitting: Pharmacist

## 2022-03-07 NOTE — Telephone Encounter (Signed)
Pt referred to PharmD by Dr Haroldine Laws to initiate [id number] therapy for weight loss. He has [employer] which does not cover any weight loss medications (Wegovy/Saxenda etc). He does not have DM so his insurance will not cover other GLPs like Ozempic. Called pt and left message for him that his insurance will not cover therapy for weight loss.  ?

## 2022-03-11 ENCOUNTER — Encounter (HOSPITAL_COMMUNITY): Payer: Self-pay | Admitting: Internal Medicine

## 2022-03-13 ENCOUNTER — Encounter (HOSPITAL_COMMUNITY): Payer: Self-pay | Admitting: *Deleted

## 2022-03-19 ENCOUNTER — Ambulatory Visit (INDEPENDENT_AMBULATORY_CARE_PROVIDER_SITE_OTHER): Payer: Self-pay

## 2022-03-19 DIAGNOSIS — I255 Ischemic cardiomyopathy: Secondary | ICD-10-CM

## 2022-03-19 LAB — CUP PACEART REMOTE DEVICE CHECK
Battery Remaining Percentage: 66 %
Date Time Interrogation Session: 20230321104200
Implantable Lead Implant Date: 20200219
Implantable Lead Location: 753862
Implantable Lead Model: 3501
Implantable Lead Serial Number: 168743
Implantable Pulse Generator Implant Date: 20200219
Pulse Gen Serial Number: 257847

## 2022-04-02 NOTE — Progress Notes (Signed)
Remote ICD transmission.   

## 2022-04-08 ENCOUNTER — Other Ambulatory Visit (HOSPITAL_COMMUNITY): Payer: Self-pay

## 2022-04-08 ENCOUNTER — Other Ambulatory Visit (HOSPITAL_COMMUNITY): Payer: Self-pay | Admitting: Internal Medicine

## 2022-04-08 MED ORDER — ATORVASTATIN CALCIUM 80 MG PO TABS
ORAL_TABLET | Freq: Every day | ORAL | 11 refills | Status: DC
  Filled 2022-04-08: qty 30, 30d supply, fill #0
  Filled 2022-05-29: qty 30, 30d supply, fill #1
  Filled 2022-07-05: qty 30, 30d supply, fill #2
  Filled 2022-08-08 – 2022-08-19 (×2): qty 30, 30d supply, fill #3
  Filled 2022-09-14: qty 30, 30d supply, fill #4
  Filled 2022-10-16: qty 30, 30d supply, fill #5
  Filled 2022-12-03: qty 30, 30d supply, fill #6
  Filled 2023-01-03: qty 30, 30d supply, fill #7
  Filled 2023-02-10: qty 30, 30d supply, fill #8
  Filled 2023-03-07: qty 30, 30d supply, fill #9

## 2022-04-09 ENCOUNTER — Encounter (HOSPITAL_COMMUNITY): Payer: Self-pay | Admitting: Internal Medicine

## 2022-05-07 ENCOUNTER — Other Ambulatory Visit (HOSPITAL_COMMUNITY): Payer: Self-pay

## 2022-05-13 ENCOUNTER — Telehealth (HOSPITAL_COMMUNITY): Payer: Self-pay | Admitting: Pharmacy Technician

## 2022-05-13 ENCOUNTER — Other Ambulatory Visit (HOSPITAL_COMMUNITY): Payer: Self-pay

## 2022-05-13 NOTE — Telephone Encounter (Signed)
Advanced Heart Failure Patient Advocate Encounter ? ?Prior Authorization for Sherryll Burger has been approved.   ? ?PA# [id number] ?Effective dates: 05/13/22 through 05/13/23 ? ?Carsey Asa, CPhT ? ? ?

## 2022-05-13 NOTE — Telephone Encounter (Signed)
Patient Advocate Encounter ?  ?Received notification from [facility] that prior authorization for Sherryll Burger is required. ?  ?PA submitted on CoverMyMeds ?Key  [id number] ?Status is pending ?  ?Will continue to follow. ? ?

## 2022-05-29 ENCOUNTER — Other Ambulatory Visit (HOSPITAL_COMMUNITY): Payer: Self-pay

## 2022-06-07 ENCOUNTER — Other Ambulatory Visit (HOSPITAL_COMMUNITY): Payer: Self-pay

## 2022-06-07 ENCOUNTER — Other Ambulatory Visit (HOSPITAL_COMMUNITY): Payer: Self-pay | Admitting: Internal Medicine

## 2022-06-10 ENCOUNTER — Other Ambulatory Visit (HOSPITAL_COMMUNITY): Payer: Self-pay

## 2022-06-10 MED ORDER — CARVEDILOL 6.25 MG PO TABS
ORAL_TABLET | ORAL | 3 refills | Status: DC
  Filled 2022-06-10: qty 90, 30d supply, fill #0
  Filled 2022-07-16: qty 90, 30d supply, fill #1
  Filled 2022-08-19: qty 90, 30d supply, fill #2
  Filled 2022-09-14: qty 90, 30d supply, fill #3

## 2022-06-18 ENCOUNTER — Ambulatory Visit (INDEPENDENT_AMBULATORY_CARE_PROVIDER_SITE_OTHER): Payer: Self-pay

## 2022-06-18 DIAGNOSIS — I255 Ischemic cardiomyopathy: Secondary | ICD-10-CM

## 2022-06-20 LAB — CUP PACEART REMOTE DEVICE CHECK
Battery Remaining Percentage: 63 %
Date Time Interrogation Session: 20230621194500
Implantable Lead Implant Date: 20200219
Implantable Lead Location: 753862
Implantable Lead Model: 3501
Implantable Lead Serial Number: 168743
Implantable Pulse Generator Implant Date: 20200219
Pulse Gen Serial Number: 257847

## 2022-07-03 NOTE — Progress Notes (Signed)
Remote ICD transmission.   

## 2022-07-05 ENCOUNTER — Other Ambulatory Visit (HOSPITAL_COMMUNITY): Payer: Self-pay | Admitting: Internal Medicine

## 2022-07-05 ENCOUNTER — Other Ambulatory Visit (HOSPITAL_COMMUNITY): Payer: Self-pay

## 2022-07-05 MED ORDER — ENTRESTO 49-51 MG PO TABS
1.0000 | ORAL_TABLET | Freq: Two times a day (BID) | ORAL | 3 refills | Status: DC
  Filled 2022-07-05: qty 180, 90d supply, fill #0
  Filled 2022-10-07: qty 180, 90d supply, fill #1
  Filled 2023-01-27: qty 180, 90d supply, fill #2
  Filled 2023-05-03: qty 180, 90d supply, fill #3

## 2022-07-16 ENCOUNTER — Other Ambulatory Visit (HOSPITAL_COMMUNITY): Payer: Self-pay

## 2022-07-16 ENCOUNTER — Other Ambulatory Visit (HOSPITAL_COMMUNITY): Payer: Self-pay | Admitting: Internal Medicine

## 2022-07-16 DIAGNOSIS — I5022 Chronic systolic (congestive) heart failure: Secondary | ICD-10-CM

## 2022-07-16 MED ORDER — DAPAGLIFLOZIN PROPANEDIOL 10 MG PO TABS
10.0000 mg | ORAL_TABLET | Freq: Every day | ORAL | 3 refills | Status: DC
  Filled 2022-07-16: qty 90, 90d supply, fill #0
  Filled 2022-10-16: qty 90, 90d supply, fill #1
  Filled 2023-01-27: qty 90, 90d supply, fill #2
  Filled 2023-04-23: qty 90, 90d supply, fill #3

## 2022-07-18 ENCOUNTER — Other Ambulatory Visit (HOSPITAL_COMMUNITY): Payer: Self-pay

## 2022-07-21 ENCOUNTER — Encounter (HOSPITAL_COMMUNITY): Payer: Self-pay | Admitting: Internal Medicine

## 2022-07-24 ENCOUNTER — Encounter (HOSPITAL_COMMUNITY): Payer: Self-pay | Admitting: Emergency Medicine

## 2022-07-24 ENCOUNTER — Ambulatory Visit (HOSPITAL_COMMUNITY)
Admission: EM | Admit: 2022-07-24 | Discharge: 2022-07-24 | Disposition: A | Discharge status: Home / Self Care | Payer: Medicaid Other | Attending: Internal Medicine | Admitting: Internal Medicine

## 2022-07-24 ENCOUNTER — Other Ambulatory Visit (HOSPITAL_COMMUNITY): Payer: Self-pay

## 2022-07-24 DIAGNOSIS — J069 Acute upper respiratory infection, unspecified: Secondary | ICD-10-CM | POA: Diagnosis present

## 2022-07-24 DIAGNOSIS — Z20822 Contact with and (suspected) exposure to covid-19: Secondary | ICD-10-CM | POA: Insufficient documentation

## 2022-07-24 DIAGNOSIS — R0981 Nasal congestion: Secondary | ICD-10-CM | POA: Diagnosis present

## 2022-07-24 MED ORDER — GUAIFENESIN ER 1200 MG PO TB12
1200.0000 mg | ORAL_TABLET | Freq: Two times a day (BID) | ORAL | 0 refills | Status: DC
  Filled 2022-07-24 – 2022-08-08 (×2): qty 14, 7d supply, fill #0

## 2022-07-24 MED ORDER — PROMETHAZINE-DM 6.25-15 MG/5ML PO SYRP
5.0000 mL | ORAL_SOLUTION | Freq: Four times a day (QID) | ORAL | 0 refills | Status: DC | PRN
  Filled 2022-07-24: qty 118, 6d supply, fill #0

## 2022-07-24 NOTE — ED Provider Notes (Signed)
[facility]    CSN: [id number] Arrival date & time: 07/24/22  0349      History   Chief Complaint Chief Complaint  Patient presents with   Cough   Nasal Congestion    HPI Sergio Hicks is a 41 y.o. male.   Patient presents to urgent care for evaluation of nasal congestion and cough that started on Monday, July 22, 2022 (2 days ago).  Patient states that his symptoms have not improved over the last 2 days with use of 1200 mg of guaifenesin every 12 hours to thin his mucus.  He has been drinking as much as he is allowed due to CHF fluid restrictions.  Denies changes in his weight and leg swelling over the last couple of days while he has been sick.  Cough is productive with yellow and brown sputum.  Denies blood in the cough in the stool.  Nasal congestion is thick and yellow as well.  Patient reports an intermittent headache that is resolved with use of ibuprofen 400 mg.  No blurry vision, decreased visual acuity, ear pain, difficulty swallowing, shortness of breath, chest pain, weakness, dizziness, and lightheadedness.  Denies nausea, vomiting, constipation, diarrhea, and abdominal pain.  Reports a mild low-grade temperature at home over the last couple of days but denies use of antipyretics in the last 12 hours.  Also reports a mild sore throat but associates this with cough.  No recent antibiotic use.  No recent changes in patient's medications.  No known sick contacts reported.  Patient is vaccinated against COVID and states that he has never been hospitalized related to COVID-19 infection in the past.   Cough   Past Medical History:  Diagnosis Date   Alcohol abuse    Asthma    Chronic systolic CHF (congestive heart failure) (HCC)    Coronary artery disease    a. OOH/late presenting MI 2019 s/p DES to prox/mid LAD.   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   Chronic systolic heart failure (HCC) 02/17/2019   CAD (coronary artery disease) 04/27/2018   Obesity 01/26/2018   ACS (acute coronary syndrome) (HCC) 01/18/2018   Heart attack (HCC)    Ischemic cardiomyopathy     Past Surgical History:  Procedure Laterality Date   CORONARY STENT INTERVENTION N/A 01/20/2018   Procedure: CORONARY STENT INTERVENTION;  Surgeon: [place]az[place], Peter M, MD;  Location: [facility] INVASIVE CV LAB;  Service: Cardiovascular;  Laterality: N/A;   LEFT HEART CATH AND CORONARY ANGIOGRAPHY N/A 01/18/2018   Procedure: LEFT HEART CATH AND CORONARY ANGIOGRAPHY;  Surgeon: Runell Gess, MD;  Location: [facility] INVASIVE CV LAB;  Service: Cardiovascular;  Laterality: N/A;   SUBQ ICD IMPLANT  02/17/2019   SUBQ ICD IMPLANT N/A 02/17/2019   Procedure: SUBQ ICD IMPLANT;  Surgeon: Marinus Maw, MD;  Location: [facility] INVASIVE CV LAB;  Service: Cardiovascular;  Laterality: N/A;       Home Medications    Prior to Admission medications   Medication Sig Start Date End Date Taking? Authorizing Provider  Guaifenesin 1200 MG TB12 Take 1 tablet (1,200 mg total) by mouth in the morning and at bedtime. 07/24/22  Yes Carlisle Beers, FNP  promethazine-dextromethorphan (PROMETHAZINE-DM) 6.25-15 MG/5ML syrup Take 5 mLs by mouth 4 (four) times daily  as needed for cough. 07/24/22  Yes Carlisle Beers, FNP  albuterol (PROVENTIL HFA;VENTOLIN HFA) 108 (90 Base) MCG/ACT inhaler Inhale 1-2 puffs into the lungs every 6 (six) hours as needed for wheezing or shortness of breath.    [provider]  aspirin EC 81 MG EC tablet Take 1 tablet (81 mg total) by mouth daily. 01/23/18   Graciella Freer, PA-C  atorvastatin (LIPITOR) 80 MG tablet TAKE 1 TABLET BY MOUTH DAILY 04/08/22 04/08/23  Bensimhon, Bevelyn Buckles, MD  carvedilol (COREG) 6.25 MG tablet TAKE 1 and 1/2  TABLETS BY MOUTH 2 TIMES DAILY 06/10/22 06/10/23   Bensimhon, Bevelyn Buckles, MD  clopidogrel (PLAVIX) 75 MG tablet TAKE 1 TABLET BY MOUTH ONCE DAILY WITH BREAKFAST 09/19/21 10/08/22  Bensimhon, Bevelyn Buckles, MD  dapagliflozin propanediol (FARXIGA) 10 MG TABS tablet Take 1 tablet by mouth daily before breakfast. 07/16/22   Bensimhon, Bevelyn Buckles, MD  furosemide (LASIX) 40 MG tablet Take 1 tablet (40 mg total) by mouth as needed. 04/17/21   Bensimhon, Bevelyn Buckles, MD  loratadine (CLARITIN) 10 MG tablet Take 10 mg by mouth daily as needed for allergies.    [provider]  Multiple Vitamin (MULTIVITAMIN) tablet Take 1 tablet by mouth daily.    [provider]  nitroGLYCERIN (NITROSTAT) 0.4 MG SL tablet Place 1 tablet (0.4 mg total) under the tongue every 5 (five) minutes as needed for chest pain. 01/22/18   Graciella Freer, PA-C  potassium chloride SA (KLOR-CON) 20 MEQ tablet Take 2 tablets (40 mEq total) by mouth daily as needed (when taking furosemide). 04/17/21   Bensimhon, Bevelyn Buckles, MD  sacubitril-valsartan (ENTRESTO) 49-51 MG TAKE 1 TABLET BY MOUTH 2 TIMES DAILY. 07/05/22 07/05/23  Bensimhon, Bevelyn Buckles, MD  spironolactone (ALDACTONE) 25 MG tablet TAKE 1 TABLET BY MOUTH ONCE A DAY 08/17/21   Bensimhon, Bevelyn Buckles, MD    Family History Family History  Adopted: Yes    Social History Social History   Tobacco Use   Smoking status: Former    Packs/day: 0.00    Types: Cigarettes   Smokeless tobacco: Never   Tobacco comments:    2019  Vaping Use   Vaping Use: Never used  Substance Use Topics   Alcohol use: Not Currently    Alcohol/week: 1.0 standard drink of alcohol    Types: 1 Glasses of wine per week    Comment: quit in 2021   Drug use: Never     Allergies   Patient has no known allergies.   Review of Systems Review of Systems  Respiratory:  Positive for cough.   Per HPI   Physical Exam Triage Vital Signs ED Triage Vitals [07/24/22 0932]  Enc Vitals Group     BP      Pulse      Resp      Temp      Temp src       SpO2      Weight      Height      Head Circumference      Peak Flow      Pain Score 3     Pain Loc      Pain Edu?      Excl. in GC?    No data found.  Updated Vital Signs BP 107/74   Pulse 74   Temp 98 F (36.7 C)   Resp 18   SpO2 96%   Visual Acuity Right Eye Distance:   Left Eye  Distance:   Bilateral Distance:    Right Eye Near:   Left Eye Near:    Bilateral Near:     Physical Exam Vitals and nursing note reviewed.  Constitutional:      Appearance: Normal appearance. He is not ill-appearing or toxic-appearing.     Comments: Very pleasant patient sitting on exam in position of comfort table in no acute distress.   HENT:     Head: Normocephalic and atraumatic.     Right Ear: Hearing, tympanic membrane, ear canal and external ear normal.     Left Ear: Hearing, tympanic membrane, ear canal and external ear normal.     Nose: Congestion present.     Mouth/Throat:     Lips: Pink.     Mouth: Mucous membranes are moist.     Pharynx: Posterior oropharyngeal erythema present. No oropharyngeal exudate.     Comments: Mild erythema to posterior oropharynx with small amount of clear postnasal drainage visualized. Airway intact and patent. Eyes:     General: Lids are normal. Vision grossly intact. Gaze aligned appropriately.        Right eye: No discharge.        Left eye: No discharge.     Extraocular Movements: Extraocular movements intact.     Conjunctiva/sclera: Conjunctivae normal.     Pupils: Pupils are equal, round, and reactive to light.  Cardiovascular:     Rate and Rhythm: Normal rate and regular rhythm.     Heart sounds: Normal heart sounds, S1 normal and S2 normal.  Pulmonary:     Effort: Pulmonary effort is normal. No respiratory distress.     Breath sounds: Normal breath sounds and air entry.  Abdominal:     Palpations: Abdomen is soft.     Tenderness: There is no right CVA tenderness, left CVA tenderness or guarding.  Musculoskeletal:     Cervical back:  Neck supple.     Right lower leg: No edema.     Left lower leg: No edema.  Skin:    General: Skin is warm and dry.     Capillary Refill: Capillary refill takes less than 2 seconds.     Findings: No rash.  Neurological:     General: No focal deficit present.     Mental Status: He is alert and oriented to person, place, and time. Mental status is at baseline.     Cranial Nerves: No dysarthria or facial asymmetry.     Gait: Gait is intact.  Psychiatric:        Mood and Affect: Mood normal.        Speech: Speech normal.        Behavior: Behavior normal.        Thought Content: Thought content normal.        Judgment: Judgment normal.      UC Treatments / Results  Labs (all labs ordered are listed, but only abnormal results are displayed) Labs Reviewed  SARS CORONAVIRUS 2 (TAT 6-24 HRS)    EKG   Radiology No results found.  Procedures Procedures (including critical care time)  Medications Ordered in UC Medications - No data to display  Initial Impression / Assessment and Plan / UC Course  I have reviewed the triage vital signs and the nursing notes.  Pertinent labs & imaging results that were available during my care of the patient were reviewed by me and considered in my medical decision making (see chart for details).  Viral URI with cough Symptomology  and physical exam are consistent with viral upper respiratory infection with cough.  We will manage this with supportive care prescriptions for symptomatic relief.  Deferred imaging based on stable cardiopulmonary exam and hemodynamically stable vital signs at this time.  Bilateral lower extremities are not swollen and he does not appear fluid overloaded or dehydrated at this time.  Guaifenesin twice daily to thin mucus prescribed.  Encourage patient to increase water intake while taking this medication and while recovering from viral illness (within limits of fluid restrictions related to CHF).  Promethazine DM prescribed  for cough at nighttime and patient advised to not drink alcohol or drive while taking this medicine as it can make her sleepy. Ibuprofen 400mg  every 4-6 hours to be taken with food for fever/chills, sore throat, aches and pains, and fever/chills.  Nonpharmacologic methods of sore throat relief provided and after visit summary.  COVID-19 testing pending.  Patient may benefit from molnupiravir if COVID testing is positive.  Discussed physical exam and available lab work findings in clinic with patient.  Counseled patient regarding appropriate use of medications and potential side effects for all medications recommended or prescribed today. Discussed red flag signs and symptoms of worsening condition,when to call the PCP office, return to urgent care, and when to seek higher level of care in the emergency department. Patient verbalizes understanding and agreement with plan. All questions answered. Patient discharged in stable condition.  Final Clinical Impressions(s) / UC Diagnoses   Final diagnoses:  Viral URI with cough  Nasal congestion     Discharge Instructions      You have a viral upper respiratory infection. COVID-19 PCR labs are pending.   Take guaifenesin 1200mg   2 times daily to thin your mucous so that you can cough it up and blow it out of your nose easier. Drink plenty of water while taking this medication so that it works well in your body (per your fluid restriction guidelines related to CHF)  Take Promethazine DM cough medication to help with your cough at nighttime so that you are able to sleep. Do not drive, drink alcohol, or go to work while taking this medication since it can make you sleepy. Only take this at nighttime.   You may take ibuprofen 400mg  every 4-6 hours as needed for body aches, chills, and fevers at home.  You may do salt water and baking soda gargles every 4 hours as needed for your throat pain.  Please put 1 teaspoon of salt and 1/2 teaspoon of baking soda in  8 ounces of warm water then gargle and spit the water out. You may also put 1 tablespoon of honey in warm water and drink this to soothe your throat.  Place a humidifier in your room at night to help decrease dry air that can irritate your airway and cause you to have a sore throat and cough.  Please try to eat a well-balanced diet while you are sick so that your body gets proper nutrition to heal.  If you develop any new or worsening symptoms, please return.  If your symptoms are severe, please go to the emergency room.  Follow-up with your primary care provider for further evaluation and management of your symptoms as well as ongoing wellness visits.  I hope you feel better!      ED Prescriptions     Medication Sig Dispense Auth. Provider   Guaifenesin 1200 MG TB12 Take 1 tablet (1,200 mg total) by mouth in the morning and at  bedtime. 14 tablet Carlisle Beers, FNP   promethazine-dextromethorphan (PROMETHAZINE-DM) 6.25-15 MG/5ML syrup Take 5 mLs by mouth 4 (four) times daily as needed for cough. 118 mL Carlisle Beers, FNP      PDMP not reviewed this encounter.   Carlisle Beers, [place] 07/24/22 1023

## 2022-07-24 NOTE — Discharge Instructions (Addendum)
You have a viral upper respiratory infection. COVID-19 PCR labs are pending.   Take guaifenesin 1200mg   2 times daily to thin your mucous so that you can cough it up and blow it out of your nose easier. Drink plenty of water while taking this medication so that it works well in your body (per your fluid restriction guidelines related to CHF)  Take Promethazine DM cough medication to help with your cough at nighttime so that you are able to sleep. Do not drive, drink alcohol, or go to work while taking this medication since it can make you sleepy. Only take this at nighttime.   You may take ibuprofen 400mg  every 4-6 hours as needed for body aches, chills, and fevers at home.  You may do salt water and baking soda gargles every 4 hours as needed for your throat pain.  Please put 1 teaspoon of salt and 1/2 teaspoon of baking soda in 8 ounces of warm water then gargle and spit the water out. You may also put 1 tablespoon of honey in warm water and drink this to soothe your throat.  Place a humidifier in your room at night to help decrease dry air that can irritate your airway and cause you to have a sore throat and cough.  Please try to eat a well-balanced diet while you are sick so that your body gets proper nutrition to heal.  If you develop any new or worsening symptoms, please return.  If your symptoms are severe, please go to the emergency room.  Follow-up with your primary care provider for further evaluation and management of your symptoms as well as ongoing wellness visits.  I hope you feel better!

## 2022-07-24 NOTE — ED Triage Notes (Signed)
Pt reports started having head congestion on Monday. Turned into cough which is now productive getting up mucous. Reports still very congested. Pt had two covid negative test.

## 2022-07-25 ENCOUNTER — Encounter: Payer: Self-pay | Admitting: Emergency Medicine

## 2022-07-25 LAB — SARS CORONAVIRUS 2 (TAT 6-24 HRS): SARS Coronavirus 2: NEGATIVE

## 2022-08-08 ENCOUNTER — Other Ambulatory Visit (HOSPITAL_COMMUNITY): Payer: Self-pay

## 2022-08-10 ENCOUNTER — Other Ambulatory Visit (HOSPITAL_COMMUNITY): Payer: Self-pay

## 2022-08-19 ENCOUNTER — Other Ambulatory Visit (HOSPITAL_COMMUNITY): Payer: Self-pay

## 2022-08-19 ENCOUNTER — Other Ambulatory Visit (HOSPITAL_COMMUNITY): Payer: Self-pay | Admitting: Internal Medicine

## 2022-08-19 MED ORDER — SPIRONOLACTONE 25 MG PO TABS
ORAL_TABLET | Freq: Every day | ORAL | 11 refills | Status: DC
  Filled 2022-08-19: qty 30, 30d supply, fill #0
  Filled 2022-09-14: qty 30, 30d supply, fill #1
  Filled 2022-10-16: qty 30, 30d supply, fill #2
  Filled 2022-12-03: qty 30, 30d supply, fill #3
  Filled 2023-01-03: qty 30, 30d supply, fill #4
  Filled 2023-02-04: qty 30, 30d supply, fill #5
  Filled 2023-03-07: qty 30, 30d supply, fill #6
  Filled 2023-04-23: qty 30, 30d supply, fill #7
  Filled 2023-06-02: qty 30, 30d supply, fill #8
  Filled 2023-07-11 (×2): qty 30, 30d supply, fill #9
  Filled 2023-08-15: qty 30, 30d supply, fill #10

## 2022-08-20 ENCOUNTER — Other Ambulatory Visit (HOSPITAL_COMMUNITY): Payer: Self-pay

## 2022-08-30 ENCOUNTER — Telehealth: Payer: Self-pay | Admitting: *Deleted

## 2022-09-03 NOTE — Progress Notes (Unsigned)
Sleep Medicine CONSULT Note    Date:  09/04/2022   ID:  Sergio Hicks, DOB 21-May-1981, MRN [id number]  PCP:  Patient, No Pcp Per  Cardiologist: Arvilla Meres, MD   Chief Complaint  Patient presents with   New Patient (Initial Visit)    Obstructive sleep apnea    History of Present Illness:  Sergio Hicks is a 41 y.o. male who is being seen today for the evaluation of obstructive sleep apnea at the request of Arvilla Meres, MD.  This is a 41 year old African-American male with a history of chronic systolic CHF, CAD, ischemic cardiomyopathy and polysubstance abuse with tobacco and alcohol.  He tells me that he has been told in the past that he snores severely and has been told he stops breathing in his sleep.  He was referred for in lab polysomnogram due to excessive daytime sleepiness with an Epworth sleepiness score of 10. He underwent a PSG and this demonstrated mild obstructive sleep apnea with an AHI of 8.2/h and O2 saturations as low as 84%.  Due to nocturnal hypoxemia and mild obstructive sleep apnea in the setting of CHF he CPAP titration to 13 cm H2O.  He is now here for follow-up.    He is doing well with his PAP device and thinks that he has gotten used to it.  He tolerates the mask and feels the pressure is adequate.  Since going on PAP he feels rested in the am and has no significant daytime sleepiness on the nights that he uses it.  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  He sometimes has problems with mouth dryness.  He does not think that he snores.    Past Medical History:  Diagnosis Date   Alcohol abuse    Asthma    Chronic systolic CHF (congestive heart failure) (HCC)    Coronary artery disease    a. OOH/late presenting MI 2019 s/p DES to prox/mid LAD.   Ischemic cardiomyopathy    Mild sleep apnea    Myocardial infarction (HCC)    Tobacco abuse     Past Surgical  History:  Procedure Laterality Date   CORONARY STENT INTERVENTION N/A 01/20/2018   Procedure: CORONARY STENT INTERVENTION;  Surgeon: Swaziland, Peter M, MD;  Location: [facility] INVASIVE CV LAB;  Service: Cardiovascular;  Laterality: N/A;   LEFT HEART CATH AND CORONARY ANGIOGRAPHY N/A 01/18/2018   Procedure: LEFT HEART CATH AND CORONARY ANGIOGRAPHY;  Surgeon: Runell Gess, MD;  Location: [facility] INVASIVE CV LAB;  Service: Cardiovascular;  Laterality: N/A;   SUBQ ICD IMPLANT  02/17/2019   SUBQ ICD IMPLANT N/A 02/17/2019   Procedure: SUBQ ICD IMPLANT;  Surgeon: Marinus Maw, MD;  Location: [facility] INVASIVE CV LAB;  Service: Cardiovascular;  Laterality: N/A;    Current Medications: Current Meds  Medication Sig   albuterol (PROVENTIL HFA;VENTOLIN HFA) 108 (90 Base) MCG/ACT inhaler Inhale 1-2 puffs into the lungs every 6 (six) hours as needed for wheezing or shortness of breath.   aspirin EC 81 MG EC tablet Take 1 tablet (81 mg total) by mouth daily.   atorvastatin (LIPITOR) 80 MG tablet TAKE 1 TABLET BY MOUTH DAILY   carvedilol (COREG) 6.25 MG tablet TAKE 1 and 1/2  TABLETS BY MOUTH 2 TIMES DAILY   clopidogrel (PLAVIX) 75 MG tablet TAKE 1 TABLET BY MOUTH ONCE DAILY WITH BREAKFAST  dapagliflozin propanediol (FARXIGA) 10 MG TABS tablet Take 1 tablet by mouth daily before breakfast.   furosemide (LASIX) 40 MG tablet Take 1 tablet (40 mg total) by mouth as needed.   Guaifenesin 1200 MG TB12 Take 1 tablet (1,200 mg total) by mouth in the morning and at bedtime.   loratadine (CLARITIN) 10 MG tablet Take 10 mg by mouth daily as needed for allergies.   Multiple Vitamin (MULTIVITAMIN) tablet Take 1 tablet by mouth daily.   nitroGLYCERIN (NITROSTAT) 0.4 MG SL tablet Place 1 tablet (0.4 mg total) under the tongue every 5 (five) minutes as needed for chest pain.   potassium chloride SA (KLOR-CON) 20 MEQ tablet Take 2 tablets (40 mEq total) by mouth daily as needed (when taking furosemide).    promethazine-dextromethorphan (PROMETHAZINE-DM) 6.25-15 MG/5ML syrup Take 5 mLs by mouth 4 (four) times daily as needed for cough.   sacubitril-valsartan (ENTRESTO) 49-51 MG TAKE 1 TABLET BY MOUTH 2 TIMES DAILY.   spironolactone (ALDACTONE) 25 MG tablet TAKE 1 TABLET BY MOUTH ONCE A DAY    Allergies:   Patient has no known allergies.   Social History   Socioeconomic History   Marital status: Single    Spouse name: Not on file   Number of children: Not on file   Years of education: Not on file   Highest education level: Not on file  Occupational History   Occupation: chef  Tobacco Use   Smoking status: Former    Packs/day: 0.00    Types: Cigarettes   Smokeless tobacco: Never   Tobacco comments:    2019  Vaping Use   Vaping Use: Never used  Substance and Sexual Activity   Alcohol use: Not Currently    Alcohol/week: 1.0 standard drink of alcohol    Types: 1 Glasses of wine per week    Comment: quit in 2021   Drug use: Never   Sexual activity: Not on file  Other Topics Concern   Not on file  Social History Narrative   Not on file   Social Determinants of Health   Financial Resource Strain: Not on file  Food Insecurity: Not on file  Transportation Needs: Not on file  Physical Activity: Not on file  Stress: Not on file  Social Connections: Not on file     Family History:  The patient's family history is not on file. He was adopted.   ROS:   Please see the history of present illness.    ROS All other systems reviewed and are negative.      No data to display             PHYSICAL EXAM:   VS:  BP 110/70 (BP Location: Left Arm, Patient Position: Sitting, Cuff Size: Normal)   Pulse 82   Ht 6\' 3"  (1.905 m)   Wt (!) 304 lb (137.9 kg)   SpO2 96%   BMI 38.00 kg/m    GEN: Well nourished, well developed, in no acute distress  HEENT: normal  Neck: no JVD, carotid bruits, or masses Cardiac: RRR; no murmurs, rubs, or gallops,no edema.  Intact distal pulses  bilaterally.  Respiratory:  clear to auscultation bilaterally, normal work of breathing GI: soft, nontender, nondistended, + BS MS: no deformity or atrophy  Skin: warm and dry, no rash Neuro:  Alert and Oriented x 3, Strength and sensation are intact Psych: euthymic mood, full affect  Wt Readings from Last 3 Encounters:  09/04/22 (!) 304 lb (137.9 kg)  03/06/22 05/06/22)  309 lb (140.2 kg)  09/16/21 295 lb (133.8 kg)      Studies/Labs Reviewed:   Polysomnogram, CPAP titration, PAP compliance download  Recent Labs: 03/06/2022: B Natriuretic Peptide 25.8; BUN 9; Creatinine, Ser 1.00; Potassium 3.9; Sodium 137    CHA2DS2-VASc Score =     This indicates a  % annual risk of stroke. The patient's score is based upon:             Additional studies/ records that were reviewed today include:  None    ASSESSMENT:    1. OSA (obstructive sleep apnea)      PLAN:  In order of problems listed above:  OSA - The patient is tolerating PAP therapy well without any problems. The PAP download performed by his DME was personally reviewed and interpreted by me today and showed an AHI of 1.8 /hr on 13 cm H2O with 30% compliance in using more than 4 hours nightly.  The patient has been using and benefiting from PAP use and will continue to benefit from therapy.  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 -He understands that if he is not using his device at least 4 to 5 hours nightly 70% of the times insurance will require that he hands his device in -I have encouraged him to adjust his humidity in his device to help with mouth and nose dryness -also encouraged him to use nasal saline spray 2 spray each nostril BID  Time Spent: 20 minutes total time of encounter, including 15 minutes spent in face-to-face patient care on the date of this encounter. This time includes coordination of care and  counseling regarding above mentioned problem list. Remainder of non-face-to-face time involved reviewing chart documents/testing relevant to the patient encounter and documentation in the medical record. I have independently reviewed documentation from referring provider  Medication Adjustments/Labs and Tests Ordered: Current medicines are reviewed at length with the patient today.  Concerns regarding medicines are outlined above.  Medication changes, Labs and Tests ordered today are listed in the Patient Instructions below.  There are no Patient Instructions on file for this visit.   Signed, Armanda Magic, MD  09/04/2022 8:38 AM    [facility] [address] [place], [place], [place]  [address] Phone: [phone]; Fax: [phone]

## 2022-09-04 ENCOUNTER — Ambulatory Visit: Payer: Medicaid Other | Attending: Cardiology | Admitting: Cardiology

## 2022-09-04 ENCOUNTER — Encounter: Payer: Self-pay | Admitting: Cardiology

## 2022-09-04 VITALS — BP 110/70 | HR 82 | Ht 75.0 in | Wt 304.0 lb

## 2022-09-04 DIAGNOSIS — G4733 Obstructive sleep apnea (adult) (pediatric): Secondary | ICD-10-CM

## 2022-09-04 NOTE — Patient Instructions (Signed)
Medication Instructions:  Your physician recommends that you continue on your current medications as directed. Please refer to the Current Medication list given to you today.  *If you need a refill on your cardiac medications before your next appointment, please call your pharmacy*  Follow-Up: At [facility], you and your health needs are our priority.  As part of our continuing mission to provide you with exceptional heart care, we have created designated Provider Care Teams.  These Care Teams include your primary Cardiologist (physician) and Advanced Practice Providers (APPs -  Physician Assistants and Nurse Practitioners) who all work together to provide you with the care you need, when you need it.  Your next appointment:   1 year(s)  The format for your next appointment:   In Person  Provider:   Traci Turner, MD     Important Information About Sugar       

## 2022-09-14 ENCOUNTER — Other Ambulatory Visit (HOSPITAL_COMMUNITY): Payer: Self-pay

## 2022-09-17 ENCOUNTER — Ambulatory Visit (INDEPENDENT_AMBULATORY_CARE_PROVIDER_SITE_OTHER): Payer: Medicaid Other

## 2022-09-17 DIAGNOSIS — I255 Ischemic cardiomyopathy: Secondary | ICD-10-CM | POA: Diagnosis not present

## 2022-09-18 LAB — CUP PACEART REMOTE DEVICE CHECK
Battery Remaining Percentage: 60 %
Date Time Interrogation Session: 20230920104100
Implantable Lead Implant Date: 20200219
Implantable Lead Location: 753862
Implantable Lead Model: 3501
Implantable Lead Serial Number: 168743
Implantable Pulse Generator Implant Date: 20200219
Pulse Gen Serial Number: 257847

## 2022-09-19 ENCOUNTER — Other Ambulatory Visit (HOSPITAL_COMMUNITY): Payer: Self-pay

## 2022-09-20 ENCOUNTER — Emergency Department (HOSPITAL_COMMUNITY)
Admission: EM | Admit: 2022-09-20 | Discharge: 2022-09-20 | Disposition: A | Discharge status: Home / Self Care | Payer: Medicaid Other | Attending: Emergency Medicine | Admitting: Emergency Medicine

## 2022-09-20 ENCOUNTER — Other Ambulatory Visit (HOSPITAL_COMMUNITY): Payer: Self-pay

## 2022-09-20 ENCOUNTER — Encounter (HOSPITAL_COMMUNITY): Payer: Self-pay

## 2022-09-20 ENCOUNTER — Other Ambulatory Visit: Payer: Self-pay

## 2022-09-20 ENCOUNTER — Emergency Department (HOSPITAL_COMMUNITY): Payer: Medicaid Other

## 2022-09-20 DIAGNOSIS — S62330A Displaced fracture of neck of second metacarpal bone, right hand, initial encounter for closed fracture: Secondary | ICD-10-CM

## 2022-09-20 DIAGNOSIS — Z7982 Long term (current) use of aspirin: Secondary | ICD-10-CM | POA: Diagnosis not present

## 2022-09-20 DIAGNOSIS — Z7902 Long term (current) use of antithrombotics/antiplatelets: Secondary | ICD-10-CM | POA: Insufficient documentation

## 2022-09-20 DIAGNOSIS — S62360A Nondisplaced fracture of neck of second metacarpal bone, right hand, initial encounter for closed fracture: Secondary | ICD-10-CM | POA: Diagnosis not present

## 2022-09-20 DIAGNOSIS — M79641 Pain in right hand: Secondary | ICD-10-CM | POA: Diagnosis not present

## 2022-09-20 DIAGNOSIS — S6991XA Unspecified injury of right wrist, hand and finger(s), initial encounter: Secondary | ICD-10-CM | POA: Diagnosis present

## 2022-09-20 DIAGNOSIS — W010XXA Fall on same level from slipping, tripping and stumbling without subsequent striking against object, initial encounter: Secondary | ICD-10-CM | POA: Insufficient documentation

## 2022-09-20 MED ORDER — OXYCODONE-ACETAMINOPHEN 5-325 MG PO TABS: 1.0000 | ORAL_TABLET | Freq: Four times a day (QID) | ORAL | 0 refills | Status: DC | PRN

## 2022-09-20 NOTE — ED Triage Notes (Signed)
Pt c/o right hand pain and swelling since falling on it today. Pt able to move all fingers.

## 2022-09-20 NOTE — ED Provider Notes (Signed)
[place] DEPT Provider Note   CSN: [id number] Arrival date & time: 09/20/22  1216     History  Chief Complaint  Patient presents with   Right Hand Injury    Sergio Hicks is a 41 y.o. male.  The history is provided by the patient and medical records. No language interpreter was used.     41 year old male presenting for evaluation of hand injury.  Patient report earlier today he was walking and tripped over his dog, fell and injured his right hand as he tried to extend to break the fall.  Complaining of sharp throbbing pain about the right hand moderate intensity nonradiating.  No wrist elbow or shoulder pain.  No other injury.  He is currently on Plavix due to having cardiac disease and having ICD.  No specific treatment tried prior to arrival.  Home Medications Prior to Admission medications   Medication Sig Start Date End Date Taking? Authorizing Provider  albuterol (PROVENTIL HFA;VENTOLIN HFA) 108 (90 Base) MCG/ACT inhaler Inhale 1-2 puffs into the lungs every 6 (six) hours as needed for wheezing or shortness of breath.    [provider]  aspirin EC 81 MG EC tablet Take 1 tablet (81 mg total) by mouth daily. 01/23/18   Shirley Friar, PA-C  atorvastatin (LIPITOR) 80 MG tablet TAKE 1 TABLET BY MOUTH DAILY 04/08/22 04/08/23  Bensimhon, Shaune Pascal, MD  carvedilol (COREG) 6.25 MG tablet TAKE 1 and 1/2  TABLETS BY MOUTH 2 TIMES DAILY 06/10/22 06/10/23  Bensimhon, Shaune Pascal, MD  clopidogrel (PLAVIX) 75 MG tablet TAKE 1 TABLET BY MOUTH ONCE DAILY WITH BREAKFAST 09/19/21 10/08/22  Bensimhon, Shaune Pascal, MD  dapagliflozin propanediol (FARXIGA) 10 MG TABS tablet Take 1 tablet by mouth daily before breakfast. 07/16/22   Bensimhon, Shaune Pascal, MD  furosemide (LASIX) 40 MG tablet Take 1 tablet (40 mg total) by mouth as needed. 04/17/21   Bensimhon, Shaune Pascal, MD  Guaifenesin 1200 MG TB12 Take 1 tablet (1,200 mg total) by mouth in the morning and at bedtime.  07/24/22   Talbot Grumbling, FNP  loratadine (CLARITIN) 10 MG tablet Take 10 mg by mouth daily as needed for allergies.    [provider]  Multiple Vitamin (MULTIVITAMIN) tablet Take 1 tablet by mouth daily.    [provider]  nitroGLYCERIN (NITROSTAT) 0.4 MG SL tablet Place 1 tablet (0.4 mg total) under the tongue every 5 (five) minutes as needed for chest pain. 01/22/18   Shirley Friar, PA-C  potassium chloride SA (KLOR-CON) 20 MEQ tablet Take 2 tablets (40 mEq total) by mouth daily as needed (when taking furosemide). 04/17/21   Bensimhon, Shaune Pascal, MD  promethazine-dextromethorphan (PROMETHAZINE-DM) 6.25-15 MG/5ML syrup Take 5 mLs by mouth 4 (four) times daily as needed for cough. 07/24/22   Talbot Grumbling, FNP  sacubitril-valsartan (ENTRESTO) 49-51 MG TAKE 1 TABLET BY MOUTH 2 TIMES DAILY. 07/05/22 07/05/23  Bensimhon, Shaune Pascal, MD  spironolactone (ALDACTONE) 25 MG tablet TAKE 1 TABLET BY MOUTH ONCE A DAY 08/19/22   Bensimhon, Shaune Pascal, MD      Allergies    Patient has no known allergies.    Review of Systems   Review of Systems  All other systems reviewed and are negative.   Physical Exam Updated Vital Signs BP 135/73 (BP Location: Left Arm)   Pulse 66   Temp 97.8 F (36.6 C) (Oral)   Resp 16   SpO2 91%  Physical Exam Vitals and nursing note  reviewed.  Constitutional:      General: He is not in acute distress.    Appearance: He is well-developed.  HENT:     Head: Atraumatic.  Eyes:     Conjunctiva/sclera: Conjunctivae normal.  Musculoskeletal:        General: Signs of injury (Right hand: Moderate edema and ecchymosis noted to the dorsum of the hand with tenderness to palpation but no crepitus noted.  No significant tenderness to all fingers, brisk cap refill, radial pulse 2+, right wrist and elbow nontender.) present.     Cervical back: Neck supple.  Skin:    Findings: No rash.  Neurological:     Mental Status: He is alert.     ED  Results / Procedures / Treatments   Labs (all labs ordered are listed, but only abnormal results are displayed) Labs Reviewed - No data to display  EKG None  Radiology DG Hand Complete Right  Result Date: 09/20/2022 CLINICAL DATA:  Right hand pain and swelling since falling today. EXAM: RIGHT HAND - COMPLETE 3+ VIEW COMPARISON:  Right hand radiographs 02/09/2007 FINDINGS: Interval healing of the prior acute fracture seen on remote 02/09/2007 radiographs. There is unchanged mild lateral/radial displacement of the distal fracture component with respect to the proximal fracture component. There is a new, acute comminuted fracture of the index finger metacarpal head extending through both the medial and lateral cortices at the level of the distal metaphysis and converging at the distal articular surface. Minimal 1 mm cortical step-off at the distal articular surface. Likely minimal dorsal apex angulation of the fracture line. Mild index finger DIP joint space narrowing. 3 mm ulnar negative variance. IMPRESSION: 1. Acute, comminuted, intra-articular fracture of the index finger metacarpal head. No significant displacement but minimal dorsal apex angulation. 2. Old healed fracture of the fifth metacarpal shaft. Electronically Signed   By: Neita Garnet M.D.   On: 09/20/2022 13:53    Procedures Procedures    Medications Ordered in ED Medications - No data to display  ED Course/ Medical Decision Making/ A&P                           Medical Decision Making Amount and/or Complexity of Data Reviewed Radiology: ordered.   BP 135/73 (BP Location: Left Arm)   Pulse 66   Temp 97.8 F (36.6 C) (Oral)   Resp 16   SpO2 91%   1:76 PM 41 year old male who had a mechanical fall when he tripped on his cat while walking earlier today.  He extended his right hand to break the fall and experiencing pain about his right hand.  Sharp throbbing moderate pain to the right hand with associate swelling.  No  right wrist elbow or shoulder pain no other injury.  He is currently on Plavix.  On exam, right hand with moderate edema, ecchymosis, and tenderness noted to the dorsum of the hand without any finger involvement.  Right wrist and elbow nontender.  No other injury noted.  I offer pain medication but patient declined.  Will obtain x-ray of the right hand as I suspect bony injury.  He is neurovascular intact.  2:12 PM Right hand x-ray obtained independently visualized and interpreted by me and I agree with radiology interpretation.  X-ray demonstrate acute comminuted intra-articular fracture of the index finger metacarpal head with no significant displacement but minimal dorsal apex angulation.  This is a closed injury.  I did discuss this with hand specialist  who recommend radial gutter splint and to follow-up in office in 2 weeks for further care.  Patient made aware of symptoms and explained have been ordered.  SPLINT APPLICATION Date/Time: 2:20 PM Authorized by: Fayrene Helper Consent: Verbal consent obtained. Risks and benefits: risks, benefits and alternatives were discussed Consent given by: patient Splint applied by: orthopedic technician Location details: R hand Splint type: radial gutter  Supplies used: orthoglass Post-procedure: The splinted body part was neurovascularly unchanged following the procedure. Patient tolerance: Patient tolerated the procedure well with no immediate complications.          Final Clinical Impression(s) / ED Diagnoses Final diagnoses:  Closed fracture of neck of second metacarpal bone of right hand, initial encounter    Rx / DC Orders ED Discharge Orders          Ordered    oxyCODONE-acetaminophen (PERCOCET) 5-325 MG tablet  Every 6 hours PRN        09/20/22 1423              Fayrene Helper, PA-C 09/20/22 1426    Loetta Rough, MD 09/20/22 [phone]

## 2022-09-20 NOTE — Discharge Instructions (Signed)
You have been diagnosed with right hand injury.  You have suffered a broken bone.  Please wear splint for support, call and follow-up closely with hand specialist for outpatient follow-up within the next 1 to 2 weeks.

## 2022-09-20 NOTE — Progress Notes (Signed)
Orthopedic Tech Progress Note Patient Details:  Sergio Hicks December 06, 1981 [id number]  Ortho Devices Type of Ortho Device: Rad Gutter splint Ortho Device/Splint Location: Right hand Ortho Device/Splint Interventions: Application   Post Interventions Patient Tolerated: Well  [name] [name] 09/20/2022, 2:43 PM

## 2022-09-26 NOTE — Progress Notes (Signed)
Remote ICD transmission.   

## 2022-10-07 ENCOUNTER — Other Ambulatory Visit (HOSPITAL_COMMUNITY): Payer: Self-pay | Admitting: Internal Medicine

## 2022-10-07 ENCOUNTER — Other Ambulatory Visit (HOSPITAL_COMMUNITY): Payer: Self-pay

## 2022-10-07 MED ORDER — CLOPIDOGREL BISULFATE 75 MG PO TABS
75.0000 mg | ORAL_TABLET | Freq: Every day | ORAL | 0 refills | Status: DC
  Filled 2022-10-07: qty 90, 90d supply, fill #0

## 2022-10-08 ENCOUNTER — Other Ambulatory Visit (HOSPITAL_COMMUNITY): Payer: Self-pay

## 2022-10-16 ENCOUNTER — Other Ambulatory Visit (HOSPITAL_COMMUNITY): Payer: Self-pay

## 2022-10-16 ENCOUNTER — Other Ambulatory Visit (HOSPITAL_COMMUNITY): Payer: Self-pay | Admitting: Internal Medicine

## 2022-10-16 MED ORDER — CARVEDILOL 6.25 MG PO TABS
9.3750 mg | ORAL_TABLET | Freq: Two times a day (BID) | ORAL | 0 refills | Status: DC
  Filled 2022-10-16: qty 90, 30d supply, fill #0

## 2022-10-18 ENCOUNTER — Other Ambulatory Visit (HOSPITAL_COMMUNITY): Payer: Self-pay

## 2022-10-21 ENCOUNTER — Other Ambulatory Visit (HOSPITAL_COMMUNITY): Payer: Self-pay

## 2022-10-23 ENCOUNTER — Other Ambulatory Visit (HOSPITAL_COMMUNITY): Payer: Self-pay

## 2022-11-07 ENCOUNTER — Encounter (HOSPITAL_COMMUNITY): Payer: Self-pay | Admitting: Internal Medicine

## 2022-11-28 ENCOUNTER — Ambulatory Visit: Payer: Medicaid Other | Admitting: Plastic Surgery

## 2022-11-28 ENCOUNTER — Encounter: Payer: Self-pay | Admitting: Plastic Surgery

## 2022-11-28 ENCOUNTER — Encounter: Payer: Self-pay | Admitting: Cardiology

## 2022-11-28 VITALS — BP 130/80 | HR 72 | Ht 75.0 in | Wt 321.8 lb

## 2022-11-28 DIAGNOSIS — N62 Hypertrophy of breast: Secondary | ICD-10-CM

## 2022-11-28 NOTE — Progress Notes (Signed)
Referring Provider No referring provider defined for this encounter.   CC:  Chief Complaint  Patient presents with   Advice Only      Sergio Hicks is an 41 y.o. male.  HPI: Sergio Hicks is a pleasant 41 year old male who is referred for evaluation of gynecomastia.  Patient has multiple medical comorbidities including ischemic cardiomyopathy and is on Plavix for a coronary artery stent.  No Known Allergies  Outpatient Encounter Medications as of 11/28/2022  Medication Sig   albuterol (PROVENTIL HFA;VENTOLIN HFA) 108 (90 Base) MCG/ACT inhaler Inhale 1-2 puffs into the lungs every 6 (six) hours as needed for wheezing or shortness of breath.   aspirin EC 81 MG EC tablet Take 1 tablet (81 mg total) by mouth daily.   atorvastatin (LIPITOR) 80 MG tablet TAKE 1 TABLET BY MOUTH DAILY   carvedilol (COREG) 6.25 MG tablet Take 1.5 tablets (9.375 mg total) by mouth 2 (two) times daily.   clopidogrel (PLAVIX) 75 MG tablet Take 1 tablet (75 mg total) by mouth daily with breakfast.--need follow up appointment   dapagliflozin propanediol (FARXIGA) 10 MG TABS tablet Take 1 tablet by mouth daily before breakfast.   furosemide (LASIX) 40 MG tablet Take 1 tablet (40 mg total) by mouth as needed.   Guaifenesin 1200 MG TB12 Take 1 tablet (1,200 mg total) by mouth in the morning and at bedtime.   loratadine (CLARITIN) 10 MG tablet Take 10 mg by mouth daily as needed for allergies.   Multiple Vitamin (MULTIVITAMIN) tablet Take 1 tablet by mouth daily.   nitroGLYCERIN (NITROSTAT) 0.4 MG SL tablet Place 1 tablet (0.4 mg total) under the tongue every 5 (five) minutes as needed for chest pain.   potassium chloride SA (KLOR-CON) 20 MEQ tablet Take 2 tablets (40 mEq total) by mouth daily as needed (when taking furosemide).   promethazine-dextromethorphan (PROMETHAZINE-DM) 6.25-15 MG/5ML syrup Take 5 mLs by mouth 4 (four) times daily as needed for cough.   sacubitril-valsartan (ENTRESTO) 49-51 MG TAKE 1 TABLET BY  MOUTH 2 TIMES DAILY.   spironolactone (ALDACTONE) 25 MG tablet TAKE 1 TABLET BY MOUTH ONCE A DAY   oxyCODONE-acetaminophen (PERCOCET) 5-325 MG tablet Take 1 tablet by mouth every 6 (six) hours as needed for moderate pain or severe pain. (Patient not taking: Reported on 11/28/2022)   No facility-administered encounter medications on file as of 11/28/2022.     Past Medical History:  Diagnosis Date   Alcohol abuse    Asthma    Chronic systolic CHF (congestive heart failure) (HCC)    Coronary artery disease    a. OOH/late presenting MI 2019 s/p DES to prox/mid LAD.   Ischemic cardiomyopathy    Mild sleep apnea    Myocardial infarction ([place])    Tobacco abuse     Past Surgical History:  Procedure Laterality Date   CORONARY STENT INTERVENTION N/A 01/20/2018   Procedure: CORONARY STENT INTERVENTION;  Surgeon: Martinique, Peter M, MD;  Location: [place] CV LAB;  Service: Cardiovascular;  Laterality: N/A;   LEFT HEART CATH AND CORONARY ANGIOGRAPHY N/A 01/18/2018   Procedure: LEFT HEART CATH AND CORONARY ANGIOGRAPHY;  Surgeon: Lorretta Harp, MD;  Location: [place] CV LAB;  Service: Cardiovascular;  Laterality: N/A;   SUBQ ICD IMPLANT  02/17/2019   SUBQ ICD IMPLANT N/A 02/17/2019   Procedure: SUBQ ICD IMPLANT;  Surgeon: Evans Lance, MD;  Location: [place] CV LAB;  Service: Cardiovascular;  Laterality: N/A;    Family History  Adopted: Yes  Social History   Social History Narrative   Not on file     Review of Systems General: Denies fevers, chills, weight loss CV: Denies chest pain, shortness of breath, palpitations Breast: Complaints of gynecomastia  Physical Exam    11/28/2022    9:29 AM 09/20/2022    2:27 PM 09/20/2022   12:38 PM  Vitals with BMI  Height 6\' 3"     Weight 321 lbs 13 oz    BMI 40.22    Systolic 130 142  Diastolic 80 82 73  Pulse 72 69 66    General:  No acute distress,  Alert and oriented, Non-Toxic, Normal speech and affect Patient not  examined at this visit  Assessment/Plan Gynecomastia: Given the patient's comorbidities and extraordinarily high risk for complications for what is essentially an aesthetic procedure he was not offered surgery today.  I explained this to him and he understood.  Will be no charge for this visit.  762 11/28/2022, 9:36 AM

## 2022-12-03 ENCOUNTER — Other Ambulatory Visit (HOSPITAL_COMMUNITY): Payer: Self-pay

## 2022-12-03 ENCOUNTER — Other Ambulatory Visit (HOSPITAL_COMMUNITY): Payer: Self-pay | Admitting: Internal Medicine

## 2022-12-03 MED ORDER — CLOPIDOGREL BISULFATE 75 MG PO TABS
75.0000 mg | ORAL_TABLET | Freq: Every day | ORAL | 0 refills | Status: DC
  Filled 2022-12-03 – 2023-01-27 (×2): qty 90, 90d supply, fill #0

## 2022-12-03 MED ORDER — CARVEDILOL 6.25 MG PO TABS
9.3750 mg | ORAL_TABLET | Freq: Two times a day (BID) | ORAL | 0 refills | Status: DC
  Filled 2022-12-03: qty 90, 30d supply, fill #0

## 2022-12-04 ENCOUNTER — Other Ambulatory Visit (HOSPITAL_COMMUNITY): Payer: Self-pay

## 2022-12-16 ENCOUNTER — Ambulatory Visit (HOSPITAL_COMMUNITY)
Admission: EM | Admit: 2022-12-16 | Discharge: 2022-12-16 | Disposition: A | Discharge status: Home / Self Care | Payer: Medicaid Other | Attending: Family Medicine | Admitting: Family Medicine

## 2022-12-16 ENCOUNTER — Encounter (HOSPITAL_COMMUNITY): Payer: Self-pay

## 2022-12-16 ENCOUNTER — Ambulatory Visit (INDEPENDENT_AMBULATORY_CARE_PROVIDER_SITE_OTHER): Payer: Medicaid Other

## 2022-12-16 DIAGNOSIS — J101 Influenza due to other identified influenza virus with other respiratory manifestations: Secondary | ICD-10-CM | POA: Diagnosis not present

## 2022-12-16 DIAGNOSIS — R0602 Shortness of breath: Secondary | ICD-10-CM | POA: Diagnosis not present

## 2022-12-16 LAB — POC INFLUENZA A AND B ANTIGEN (URGENT CARE ONLY)
INFLUENZA A ANTIGEN, POC: POSITIVE — AB
INFLUENZA B ANTIGEN, POC: NEGATIVE

## 2022-12-16 MED ORDER — HYDROCODONE BIT-HOMATROP MBR 5-1.5 MG/5ML PO SOLN: 5.0000 mL | Freq: Four times a day (QID) | ORAL | 0 refills | Status: DC | PRN

## 2022-12-16 MED ORDER — ACETAMINOPHEN 325 MG PO TABS
ORAL_TABLET | ORAL | Status: AC
  Filled 2022-12-16: qty 2

## 2022-12-16 MED ORDER — ACETAMINOPHEN 325 MG PO TABS
650.0000 mg | ORAL_TABLET | Freq: Once | ORAL | Status: AC
  Administered 2022-12-16: 650 mg via ORAL

## 2022-12-16 MED ORDER — OSELTAMIVIR PHOSPHATE 75 MG PO CAPS: 75.0000 mg | ORAL_CAPSULE | Freq: Two times a day (BID) | ORAL | 0 refills | Status: AC

## 2022-12-16 NOTE — ED Triage Notes (Signed)
Chief Complaint: Cough is slightly productive / Congestion / Headache / Body Aches/ SOB. Patient having chills but no fever.   Onset: last night   Prescriptions or OTC medications tried: Yes- Dayquil, Mucinex    with little relief  Sick exposure: Yes- Patient was on tour with a band   New foods, medications, or products: No  Recent Travel: Yes- Along the [employer].

## 2022-12-16 NOTE — Discharge Instructions (Signed)

## 2022-12-17 ENCOUNTER — Ambulatory Visit (INDEPENDENT_AMBULATORY_CARE_PROVIDER_SITE_OTHER): Payer: Self-pay

## 2022-12-17 DIAGNOSIS — I255 Ischemic cardiomyopathy: Secondary | ICD-10-CM

## 2022-12-18 ENCOUNTER — Institutional Professional Consult (permissible substitution): Payer: Medicaid Other | Admitting: Plastic Surgery

## 2022-12-18 NOTE — ED Provider Notes (Signed)
[facility]   [id number] 12/16/22 Arrival Time: 2505  ASSESSMENT & PLAN:  1. Influenza A    Results for orders placed or performed during the hospital encounter of 12/16/22  POC Influenza A & B Ag (Urgent Care)  Result Value Ref Range   INFLUENZA A ANTIGEN, POC POSITIVE (A) NEGATIVE   INFLUENZA B ANTIGEN, POC NEGATIVE NEGATIVE    Discussed typical duration of symptoms. OTC symptom care as needed.  Discharge Medication List as of 12/16/2022  2:06 PM     START taking these medications   Details  HYDROcodone bit-homatropine (HYCODAN) 5-1.5 MG/5ML syrup Take 5 mLs by mouth every 6 (six) hours as needed for cough., Starting Mon 12/16/2022, Normal    oseltamivir (TAMIFLU) 75 MG capsule Take 1 capsule (75 mg total) by mouth 2 (two) times daily for 5 days., Starting Mon 12/16/2022, Until Sat 12/21/2022, Normal         Follow-up Information     [facility].   Specialty: Urgent Care Why: If worsening or failing to improve as anticipated. Contact information: [address] [place] [address] [phone]                Reviewed expectations re: course of current medical issues. Questions answered. Outlined signs and symptoms indicating need for more acute intervention. Understanding verbalized. After Visit Summary given.   SUBJECTIVE: History from: Patient. Sergio Hicks is a 41 y.o. male.  Reports cough, nasal congestion, body aches; abrupt onset; x 2 days. Afebrile. Normal PO intake without n/v/d.  OBJECTIVE:  Vitals:   12/16/22 1214  BP: 119/79  Pulse: 89  Resp: 16  Temp: (!) 102 F (38.9 C)  TempSrc: Oral  SpO2: 95%    Temp noted. General appearance: alert; no distress Eyes: PERRLA; EOMI; conjunctiva normal HENT: [place]; AT; with nasal congestion Neck: supple  Lungs: speaks full sentences without difficulty; unlabored; CTAB Extremities: no edema Skin: warm and dry Neurologic: normal  gait Psychological: alert and cooperative; normal mood and affect  Labs: Results for orders placed or performed during the hospital encounter of 12/16/22  POC Influenza A & B Ag (Urgent Care)  Result Value Ref Range   INFLUENZA A ANTIGEN, POC POSITIVE (A) NEGATIVE   INFLUENZA B ANTIGEN, POC NEGATIVE NEGATIVE   Labs Reviewed  POC INFLUENZA A AND B ANTIGEN (URGENT CARE ONLY) - Abnormal; Notable for the following components:      Result Value   INFLUENZA A ANTIGEN, POC POSITIVE (*)    All other components within normal limits    Imaging: DG Chest 2 View  Result Date: 12/16/2022 CLINICAL DATA:  Shortness of breath, headache, body chills. EXAM: CHEST - 2 VIEW COMPARISON:  Chest x-ray dated 05/31/2021. FINDINGS: Heart size and mediastinal contours are within normal limits. Lungs are clear. No pleural effusion or pneumothorax is seen. Osseous structures about the chest are unremarkable. IMPRESSION: No active cardiopulmonary disease. No evidence of pneumonia or pulmonary edema. Electronically Signed   By: Bary Richard M.D.   On: 12/16/2022 12:52    No Known Allergies  Past Medical History:  Diagnosis Date   Alcohol abuse    Asthma    Chronic systolic CHF (congestive heart failure) (HCC)    Coronary artery disease    a. OOH/late presenting MI 2019 s/p DES to prox/mid LAD.   Ischemic cardiomyopathy    Mild sleep apnea    Myocardial infarction [facility])    Tobacco abuse    Social  History   Socioeconomic History   Marital status: Single    Spouse name: Not on file   Number of children: Not on file   Years of education: Not on file   Highest education level: Not on file  Occupational History   Occupation: chef  Tobacco Use   Smoking status: Former    Packs/day: 0.00    Types: Cigarettes   Smokeless tobacco: Never   Tobacco comments:    2019  Vaping Use   Vaping Use: Never used  Substance and Sexual Activity   Alcohol use: Not Currently    Alcohol/week: 1.0 standard drink of  alcohol    Types: 1 Glasses of wine per week    Comment: quit in 2021   Drug use: Never   Sexual activity: Not on file  Other Topics Concern   Not on file  Social History Narrative   Not on file   Social Determinants of Health   Financial Resource Strain: Not on file  Food Insecurity: Not on file  Transportation Needs: Not on file  Physical Activity: Not on file  Stress: Not on file  Social Connections: Not on file  Intimate Partner Violence: Not on file   Family History  Adopted: Yes   Past Surgical History:  Procedure Laterality Date   CORONARY STENT INTERVENTION N/A 01/20/2018   Procedure: CORONARY STENT INTERVENTION;  Surgeon: Swaziland, Peter M, MD;  Location: [facility] INVASIVE CV LAB;  Service: Cardiovascular;  Laterality: N/A;   LEFT HEART CATH AND CORONARY ANGIOGRAPHY N/A 01/18/2018   Procedure: LEFT HEART CATH AND CORONARY ANGIOGRAPHY;  Surgeon: Runell Gess, MD;  Location: [facility] INVASIVE CV LAB;  Service: Cardiovascular;  Laterality: N/A;   SUBQ ICD IMPLANT  02/17/2019   SUBQ ICD IMPLANT N/A 02/17/2019   Procedure: SUBQ ICD IMPLANT;  Surgeon: Marinus Maw, MD;  Location: [facility] INVASIVE CV LAB;  Service: Cardiovascular;  Laterality: N/A;     Mardella Layman, MD 12/18/22 1344

## 2022-12-19 LAB — CUP PACEART REMOTE DEVICE CHECK
Battery Remaining Percentage: 57 %
Date Time Interrogation Session: 20231221003800
Implantable Lead Connection Status: 753985
Implantable Lead Implant Date: 20200219
Implantable Lead Location: 753862
Implantable Lead Model: 3501
Implantable Lead Serial Number: 168743
Implantable Pulse Generator Implant Date: 20200219
Pulse Gen Serial Number: 257847

## 2023-01-03 ENCOUNTER — Other Ambulatory Visit (HOSPITAL_COMMUNITY): Payer: Self-pay | Admitting: Internal Medicine

## 2023-01-03 ENCOUNTER — Other Ambulatory Visit (HOSPITAL_COMMUNITY): Payer: Self-pay

## 2023-01-03 MED ORDER — CARVEDILOL 6.25 MG PO TABS
9.3750 mg | ORAL_TABLET | Freq: Two times a day (BID) | ORAL | 0 refills | Status: DC
  Filled 2023-01-03: qty 90, 30d supply, fill #0

## 2023-01-06 ENCOUNTER — Other Ambulatory Visit: Payer: Self-pay

## 2023-01-08 ENCOUNTER — Encounter (HOSPITAL_COMMUNITY): Payer: Self-pay | Admitting: Internal Medicine

## 2023-01-13 NOTE — Progress Notes (Signed)
Remote ICD transmission.   

## 2023-01-18 NOTE — Telephone Encounter (Signed)
Patient had an appointment on 09/04/22.

## 2023-01-27 ENCOUNTER — Other Ambulatory Visit (HOSPITAL_COMMUNITY): Payer: Self-pay

## 2023-02-04 ENCOUNTER — Other Ambulatory Visit (HOSPITAL_COMMUNITY): Payer: Self-pay

## 2023-02-04 ENCOUNTER — Other Ambulatory Visit (HOSPITAL_COMMUNITY): Payer: Self-pay | Admitting: Internal Medicine

## 2023-02-04 MED ORDER — CARVEDILOL 6.25 MG PO TABS
9.3750 mg | ORAL_TABLET | Freq: Two times a day (BID) | ORAL | 0 refills | Status: DC
  Filled 2023-02-04: qty 90, 30d supply, fill #0

## 2023-02-06 ENCOUNTER — Other Ambulatory Visit: Payer: Self-pay

## 2023-02-10 ENCOUNTER — Other Ambulatory Visit (HOSPITAL_COMMUNITY): Payer: Self-pay

## 2023-02-10 MED ORDER — ENTRESTO 49-51 MG PO TABS
1.0000 | ORAL_TABLET | Freq: Every day | ORAL | 0 refills | Status: DC
  Filled 2023-02-10: qty 30, 30d supply, fill #0

## 2023-02-11 ENCOUNTER — Other Ambulatory Visit (HOSPITAL_COMMUNITY): Payer: Self-pay

## 2023-02-13 ENCOUNTER — Other Ambulatory Visit (HOSPITAL_COMMUNITY): Payer: Self-pay

## 2023-02-14 ENCOUNTER — Other Ambulatory Visit (HOSPITAL_COMMUNITY): Payer: Self-pay

## 2023-02-17 ENCOUNTER — Other Ambulatory Visit (HOSPITAL_COMMUNITY): Payer: Self-pay

## 2023-02-17 MED ORDER — ATORVASTATIN CALCIUM 40 MG PO TABS
40.0000 mg | ORAL_TABLET | Freq: Two times a day (BID) | ORAL | 2 refills | Status: DC
  Filled 2023-02-17: qty 60, 30d supply, fill #0

## 2023-02-17 MED ORDER — ENTRESTO 49-51 MG PO TABS
1.0000 | ORAL_TABLET | Freq: Two times a day (BID) | ORAL | 2 refills | Status: DC
  Filled 2023-08-15: qty 60, 30d supply, fill #0
  Filled 2023-09-16 – 2023-09-17 (×2): qty 60, 30d supply, fill #1
  Filled 2023-10-14 – 2024-01-14 (×2): qty 60, 30d supply, fill #2

## 2023-02-18 ENCOUNTER — Other Ambulatory Visit (HOSPITAL_COMMUNITY): Payer: Self-pay

## 2023-03-07 ENCOUNTER — Other Ambulatory Visit (HOSPITAL_COMMUNITY): Payer: Self-pay

## 2023-03-07 ENCOUNTER — Other Ambulatory Visit (HOSPITAL_COMMUNITY): Payer: Self-pay | Admitting: Internal Medicine

## 2023-03-07 ENCOUNTER — Other Ambulatory Visit: Payer: Self-pay

## 2023-03-07 MED ORDER — CARVEDILOL 6.25 MG PO TABS
9.3750 mg | ORAL_TABLET | Freq: Two times a day (BID) | ORAL | 0 refills | Status: DC
  Filled 2023-03-07: qty 90, 30d supply, fill #0

## 2023-03-27 ENCOUNTER — Ambulatory Visit (INDEPENDENT_AMBULATORY_CARE_PROVIDER_SITE_OTHER): Payer: Medicaid Other

## 2023-03-27 DIAGNOSIS — I255 Ischemic cardiomyopathy: Secondary | ICD-10-CM | POA: Diagnosis not present

## 2023-04-01 LAB — CUP PACEART REMOTE DEVICE CHECK
Battery Remaining Percentage: 54 %
Date Time Interrogation Session: 20240328103400
Implantable Lead Connection Status: 753985
Implantable Lead Implant Date: 20200219
Implantable Lead Location: 753862
Implantable Lead Model: 3501
Implantable Lead Serial Number: 168743
Implantable Pulse Generator Implant Date: 20200219
Pulse Gen Serial Number: 257847

## 2023-04-15 ENCOUNTER — Other Ambulatory Visit (HOSPITAL_COMMUNITY): Payer: Self-pay

## 2023-04-23 ENCOUNTER — Other Ambulatory Visit: Payer: Self-pay

## 2023-04-23 ENCOUNTER — Other Ambulatory Visit (HOSPITAL_COMMUNITY): Payer: Self-pay | Admitting: Internal Medicine

## 2023-04-23 ENCOUNTER — Other Ambulatory Visit (HOSPITAL_COMMUNITY): Payer: Self-pay

## 2023-04-23 MED ORDER — CARVEDILOL 6.25 MG PO TABS
9.3750 mg | ORAL_TABLET | Freq: Two times a day (BID) | ORAL | 0 refills | Status: DC
  Filled 2023-04-23: qty 30, 10d supply, fill #0

## 2023-04-23 MED ORDER — CLOPIDOGREL BISULFATE 75 MG PO TABS
75.0000 mg | ORAL_TABLET | Freq: Every day | ORAL | 0 refills | Status: DC
  Filled 2023-04-23: qty 30, 30d supply, fill #0

## 2023-04-24 ENCOUNTER — Other Ambulatory Visit: Payer: Self-pay

## 2023-04-30 NOTE — Progress Notes (Signed)
Remote ICD transmission.   

## 2023-05-03 ENCOUNTER — Other Ambulatory Visit (HOSPITAL_COMMUNITY): Payer: Self-pay | Admitting: Internal Medicine

## 2023-05-05 ENCOUNTER — Other Ambulatory Visit: Payer: Self-pay

## 2023-05-08 ENCOUNTER — Other Ambulatory Visit (HOSPITAL_COMMUNITY): Payer: Self-pay

## 2023-05-30 ENCOUNTER — Other Ambulatory Visit (HOSPITAL_COMMUNITY): Payer: Self-pay

## 2023-06-02 ENCOUNTER — Other Ambulatory Visit (HOSPITAL_COMMUNITY): Payer: Self-pay

## 2023-06-02 ENCOUNTER — Other Ambulatory Visit (HOSPITAL_COMMUNITY): Payer: Self-pay | Admitting: Internal Medicine

## 2023-06-02 MED ORDER — CARVEDILOL 6.25 MG PO TABS
9.3750 mg | ORAL_TABLET | Freq: Two times a day (BID) | ORAL | 0 refills | Status: DC
  Filled 2023-06-02: qty 30, 10d supply, fill #0

## 2023-06-03 ENCOUNTER — Other Ambulatory Visit (HOSPITAL_COMMUNITY): Payer: Self-pay | Admitting: *Deleted

## 2023-06-03 DIAGNOSIS — I5022 Chronic systolic (congestive) heart failure: Secondary | ICD-10-CM

## 2023-06-11 ENCOUNTER — Ambulatory Visit (HOSPITAL_COMMUNITY)
Admission: RE | Admit: 2023-06-11 | Discharge: 2023-06-11 | Disposition: A | Discharge status: Home / Self Care | Payer: Medicaid Other | Source: Ambulatory Visit | Attending: Internal Medicine | Admitting: Internal Medicine

## 2023-06-11 ENCOUNTER — Telehealth: Payer: Self-pay

## 2023-06-11 DIAGNOSIS — I5022 Chronic systolic (congestive) heart failure: Secondary | ICD-10-CM | POA: Diagnosis present

## 2023-06-11 LAB — ECHOCARDIOGRAM COMPLETE
Area-P 1/2: 4.21 cm2
Calc EF: 33.6 %
S' Lateral: 4.7 cm
Single Plane A2C EF: 35 %
Single Plane A4C EF: 33.3 %

## 2023-06-11 MED ORDER — PERFLUTREN LIPID MICROSPHERE
1.0000 mL | INTRAVENOUS | Status: AC | PRN
  Administered 2023-06-11: 7 mL via INTRAVENOUS

## 2023-06-11 NOTE — Progress Notes (Signed)
2D Echocardiogram has been performed.  Sergio Hicks 06/11/2023, 12:45 PM

## 2023-06-11 NOTE — Telephone Encounter (Signed)
Patient reports of soreness around ICD x6 days. No injury, swelling, drainage or known issues that would correlate with soreness. Advised to take tylenol as needed with ice packs to area for several days to see if improves. Advised if not any better to call back. Pt voiced understanding and appreciative of call.

## 2023-06-11 NOTE — Telephone Encounter (Signed)
The patient states he believes his ICD has shifted because it is hurting him. I let him speak with Leigh.

## 2023-06-23 ENCOUNTER — Other Ambulatory Visit (HOSPITAL_COMMUNITY): Payer: Self-pay | Admitting: Internal Medicine

## 2023-06-24 ENCOUNTER — Other Ambulatory Visit (HOSPITAL_COMMUNITY): Payer: Self-pay

## 2023-06-24 MED ORDER — CARVEDILOL 6.25 MG PO TABS
9.3750 mg | ORAL_TABLET | Freq: Two times a day (BID) | ORAL | 0 refills | Status: DC
  Filled 2023-06-24: qty 30, 10d supply, fill #0

## 2023-06-24 MED ORDER — CLOPIDOGREL BISULFATE 75 MG PO TABS
75.0000 mg | ORAL_TABLET | Freq: Every day | ORAL | 0 refills | Status: DC
  Filled 2023-06-24: qty 30, 30d supply, fill #0

## 2023-06-24 MED ORDER — ATORVASTATIN CALCIUM 80 MG PO TABS
80.0000 mg | ORAL_TABLET | Freq: Every day | ORAL | 11 refills | Status: DC
  Filled 2023-06-24: qty 30, 30d supply, fill #0
  Filled 2023-07-21: qty 30, 30d supply, fill #1
  Filled 2023-09-16 – 2023-09-17 (×2): qty 30, 30d supply, fill #2
  Filled 2024-01-14: qty 30, 30d supply, fill #3
  Filled 2024-02-19: qty 30, 30d supply, fill #4
  Filled 2024-03-25: qty 30, 30d supply, fill #5
  Filled 2024-04-27: qty 30, 30d supply, fill #6

## 2023-06-24 NOTE — Progress Notes (Signed)
Advanced Heart Failure Clinic Note  PCP: [facility]) EP: Dr. Ladona Ridgel HF Cardiologist: Dr. Gala Romney  HPI: Sergio Hicks is a 42 y.o. 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   Admitted 01/18/2018 with OOH anterior MI and severe ICM. Taken urgently for cath which showed 100% LAD occlusion as below. ECHO 01/18/18 LVEF 30%. Initially treated medically but developed persistent CP so taken for PCI and pt is s/p DES to proximal/mid LAD 01/20/18.  S/p SQ [employer] ICD with Dr. Ladona Ridgel on 02/17/19.  COVID + 12/21.  Echo 4/22 EF 30-35%, moderately decreased LV, grade II DD, RV ok    Follow up 6/22, remained fatigued with NYHA II to early III symptoms, volume ok.   Last seen 3/23, NYHA II-early III, volume OK.   Echo 6/24 showed EF 30-35%, HK apex, RV ok  Today he returns for HF follow up. Overall feeling fair, has "good and bad days". He is not SOB walking on flat ground or walking up steps. Main complaint is HA and fatigue. Has soreness where ICD is. He has occasional dizziness, no falls. He has occasional chest tightness & palpitations. Denies edema, abnormal bleeding or PND/Orthopnea. Appetite ok. No fever or chills. Weight at home 297-299 pounds. Taking all medications. Wears CPAP, has not needed Lasix. No tobacco/ETOH, occasional edible use. Unable to work.  Cardiac Studies:  Echo (6/24): EF 30-35%, RV ok Echo (4/22): EF 30-35%, G2DD, Echo (1/21): EF 35-40% RV normal (Dr. Gala Romney felt 30-35%). Echo (8/19): EF 25-30% Echo (4/19): EF 30-35%.  Echo (1/19): EF 30-35%   - LHC (1/19): DES to prox/mid LAD  Review of systems complete and found to be negative unless listed in HPI.   SH:  Social History   Socioeconomic History   Marital status: Single    Spouse name: Not on file   Number of children: Not on file   Years of education: Not  on file   Highest education level: Not on file  Occupational History   Occupation: chef  Tobacco Use   Smoking status: Former    Packs/day: 0    Types: Cigarettes   Smokeless tobacco: Never   Tobacco comments:    2019  Vaping Use   Vaping Use: Never used  Substance and Sexual Activity   Alcohol use: Not Currently    Alcohol/week: 1.0 standard drink of alcohol    Types: 1 Glasses of wine per week    Comment: quit in 2021   Drug use: Never   Sexual activity: Not on file  Other Topics Concern   Not on file  Social History Narrative   Not on file   Social Determinants of Health   Financial Resource Strain: Not on file  Food Insecurity: Not on file  Transportation Needs: Not on file  Physical Activity: Not on file  Stress: Not on file  Social Connections: Not on file  Intimate Partner Violence: Not on file   FH:  Family History  Adopted: Yes   Past Medical History:  Diagnosis Date   Alcohol abuse    Asthma    Chronic systolic CHF (congestive heart failure) (HCC)    Coronary artery disease    a. OOH/late presenting MI 2019 s/p DES to prox/mid LAD.   Ischemic cardiomyopathy    Mild sleep apnea    Myocardial infarction (HCC)    Tobacco  abuse    Current Outpatient Medications  Medication Sig Dispense Refill   albuterol (PROVENTIL HFA;VENTOLIN HFA) 108 (90 Base) MCG/ACT inhaler Inhale 1-2 puffs into the lungs every 6 (six) hours as needed for wheezing or shortness of breath.     aspirin EC 81 MG EC tablet Take 1 tablet (81 mg total) by mouth daily. 30 tablet 6   atorvastatin (LIPITOR) 80 MG tablet Take 1 tablet (80 mg total) by mouth daily. 30 tablet 11   carvedilol (COREG) 6.25 MG tablet Take 1.5 tablets (9.375 mg total) by mouth 2 (two) times daily. NEEDS FOLLOW UP APPOINTMENT FOR ANYMORE REFILLS 30 tablet 0   clopidogrel (PLAVIX) 75 MG tablet Take 1 tablet (75 mg) by mouth daily with breakfast.--need follow up appointment 30 tablet 0   dapagliflozin propanediol  (FARXIGA) 10 MG TABS tablet Take 1 tablet by mouth daily before breakfast. 90 tablet 3   furosemide (LASIX) 40 MG tablet Take 1 tablet (40 mg total) by mouth as needed. 30 tablet 3   loratadine (CLARITIN) 10 MG tablet Take 10 mg by mouth daily as needed for allergies.     Multiple Vitamin (MULTIVITAMIN) tablet Take 1 tablet by mouth daily.     nitroGLYCERIN (NITROSTAT) 0.4 MG SL tablet Place 1 tablet (0.4 mg total) under the tongue every 5 (five) minutes as needed for chest pain. 60 tablet 0   potassium chloride SA (KLOR-CON) 20 MEQ tablet Take 2 tablets (40 mEq total) by mouth daily as needed (when taking furosemide). 30 tablet 3   sacubitril-valsartan (ENTRESTO) 49-51 MG Take 1 tablet by mouth 2 (two) times daily. 60 tablet 2   spironolactone (ALDACTONE) 25 MG tablet TAKE 1 TABLET BY MOUTH ONCE A DAY 30 tablet 11   No current facility-administered medications for this encounter.   BP 106/78   Pulse 66   Wt (!) 139.6 kg (307 lb 12.8 oz)   SpO2 95%   BMI 38.47 kg/m   Wt Readings from Last 3 Encounters:  06/25/23 (!) 139.6 kg (307 lb 12.8 oz)  11/28/22 (!) 146 kg (321 lb 12.8 oz)  09/04/22 (!) 137.9 kg (304 lb)    PHYSICAL EXAM: General:  NAD. No resp difficulty, walked into clinic HEENT: Normal Neck: Supple. No JVD, thick neck. Carotids 2+ bilat; no bruits. No lymphadenopathy or thryomegaly appreciated. Cor: PMI nondisplaced. Regular rate & rhythm. No rubs, gallops or murmurs. Lungs: Clear Abdomen: Obese, nontender, nondistended. No hepatosplenomegaly. No bruits or masses. Good bowel sounds. Extremities: No cyanosis, clubbing, rash, edema Neuro: Alert & oriented x 3, cranial nerves grossly intact. Moves all 4 extremities w/o difficulty. Flat affect.  Device interrogation (personally reviewed from 03/27/23): no AF, no shocks  ECG (personally reviewed): NSR 67 bpm  ASSESSMENT & PLAN: 1. Chronic Systolic Heart Failure due to ICM - Echo (1/19): EF 30-35%.  - Echo (4/19): EF 30-35%.   - Echo (8/19): EF 25-30%  - Echo (1/21): EF 35-40% RV ok. (I thought 30-35%) - S/p SQ [employer] ICD 12/18/19. - Echo (6/22): EF 30-35% - Echo (6/24): EF 30-35%, RV ok - Stable NYHA II- early III, volume OK. GDMT limited by BP. - Continue Lasix 40 mg prn. Does not need it daily. - Continue Entresto 49/51 mg bid.  - Continue carvedilol  9.375 mg bid. - Continue spironolactone 25 mg daily. - Continue Farxiga 10 mg daily. - Can consider Barostim. - Labs today.  2. CAD - Cath 1/20 with total occlusion of LAD with R to L  collaterals.  - s/p DES to proximal/mid LAD (1/20). - No s/s of angina. - Continue high dose statin, aspirin, and plavix. - Check lipids  3. Obesity - Body mass index is 38.47 kg/m. - Encouraged continued physical activity in gym as able, and portion control. - Insurance will not cover GLP1RA w/o DM2 diagnosis  4. OSA - Continue CPAP. - Follows with Dr. Mayford Knife  5. Fatigue - Likely multifactorial - Check anemia panel - Continue CPAP  6. SDOH - He has Medicaid - Unable to work due to symptoms. Engage HFSW to help with food stamps  Follow up in 6 months with Dr. Gala Romney.  Jacklynn Ganong, FNP 8:47 AM

## 2023-06-25 ENCOUNTER — Ambulatory Visit (HOSPITAL_COMMUNITY)
Admission: RE | Admit: 2023-06-25 | Discharge: 2023-06-25 | Disposition: A | Discharge status: Home / Self Care | Payer: Medicaid Other | Source: Ambulatory Visit | Attending: Family Medicine | Admitting: Family Medicine

## 2023-06-25 ENCOUNTER — Encounter (HOSPITAL_COMMUNITY): Payer: Self-pay

## 2023-06-25 ENCOUNTER — Other Ambulatory Visit (HOSPITAL_COMMUNITY): Payer: Self-pay

## 2023-06-25 VITALS — BP 106/78 | HR 66 | Wt 307.8 lb

## 2023-06-25 DIAGNOSIS — Z6838 Body mass index (BMI) 38.0-38.9, adult: Secondary | ICD-10-CM | POA: Diagnosis not present

## 2023-06-25 DIAGNOSIS — Z87891 Personal history of nicotine dependence: Secondary | ICD-10-CM | POA: Insufficient documentation

## 2023-06-25 DIAGNOSIS — R002 Palpitations: Secondary | ICD-10-CM | POA: Diagnosis not present

## 2023-06-25 DIAGNOSIS — R42 Dizziness and giddiness: Secondary | ICD-10-CM | POA: Diagnosis not present

## 2023-06-25 DIAGNOSIS — Z955 Presence of coronary angioplasty implant and graft: Secondary | ICD-10-CM | POA: Insufficient documentation

## 2023-06-25 DIAGNOSIS — Z7902 Long term (current) use of antithrombotics/antiplatelets: Secondary | ICD-10-CM | POA: Insufficient documentation

## 2023-06-25 DIAGNOSIS — E669 Obesity, unspecified: Secondary | ICD-10-CM | POA: Diagnosis not present

## 2023-06-25 DIAGNOSIS — I5022 Chronic systolic (congestive) heart failure: Secondary | ICD-10-CM

## 2023-06-25 DIAGNOSIS — Z139 Encounter for screening, unspecified: Secondary | ICD-10-CM

## 2023-06-25 DIAGNOSIS — Z7982 Long term (current) use of aspirin: Secondary | ICD-10-CM | POA: Insufficient documentation

## 2023-06-25 DIAGNOSIS — R5383 Other fatigue: Secondary | ICD-10-CM

## 2023-06-25 DIAGNOSIS — G4733 Obstructive sleep apnea (adult) (pediatric): Secondary | ICD-10-CM

## 2023-06-25 DIAGNOSIS — I2510 Atherosclerotic heart disease of native coronary artery without angina pectoris: Secondary | ICD-10-CM | POA: Diagnosis not present

## 2023-06-25 DIAGNOSIS — I252 Old myocardial infarction: Secondary | ICD-10-CM | POA: Diagnosis not present

## 2023-06-25 DIAGNOSIS — R0789 Other chest pain: Secondary | ICD-10-CM | POA: Insufficient documentation

## 2023-06-25 DIAGNOSIS — F1010 Alcohol abuse, uncomplicated: Secondary | ICD-10-CM | POA: Diagnosis not present

## 2023-06-25 LAB — COMPREHENSIVE METABOLIC PANEL
ALT: 18 U/L (ref 0–44)
AST: 16 U/L (ref 15–41)
Albumin: 3.8 g/dL (ref 3.5–5.0)
Alkaline Phosphatase: 52 U/L (ref 38–126)
Anion gap: 11 (ref 5–15)
BUN: 18 mg/dL (ref 6–20)
CO2: 21 mmol/L — ABNORMAL LOW (ref 22–32)
Calcium: 9.2 mg/dL (ref 8.9–10.3)
Chloride: 104 mmol/L (ref 98–111)
Creatinine, Ser: 0.97 mg/dL (ref 0.61–1.24)
GFR, Estimated: >60 mL/min (ref >60)
Glucose, Bld: 107 mg/dL — ABNORMAL HIGH (ref 70–99)
Potassium: 4 mmol/L (ref 3.5–5.1)
Sodium: 136 mmol/L (ref 135–145)
Total Bilirubin: 0.7 mg/dL (ref 0.3–1.2)
Total Protein: 7.3 g/dL (ref 6.5–8.1)

## 2023-06-25 LAB — LIPID PANEL
Cholesterol: 176 mg/dL (ref 0–200)
HDL: 36 mg/dL — ABNORMAL LOW (ref >40)
LDL Cholesterol: 120 mg/dL — ABNORMAL HIGH (ref 0–99)
Total CHOL/HDL Ratio: 4.9 RATIO
Triglycerides: 102 mg/dL (ref <150)
VLDL: 20 mg/dL (ref 0–40)

## 2023-06-25 LAB — BRAIN NATRIURETIC PEPTIDE: B Natriuretic Peptide: 35 pg/mL (ref 0.0–100.0)

## 2023-06-25 LAB — CBC
HCT: 44.5 % (ref 39.0–52.0)
Hemoglobin: 15.4 g/dL (ref 13.0–17.0)
MCH: 30 pg (ref 26.0–34.0)
MCHC: 34.6 g/dL (ref 30.0–36.0)
MCV: 86.6 fL (ref 80.0–100.0)
Platelets: 288 10*3/uL (ref 150–400)
RBC: 5.14 MIL/uL (ref 4.22–5.81)
RDW: 12.7 % (ref 11.5–15.5)
WBC: 8 10*3/uL (ref 4.0–10.5)
nRBC: 0 % (ref 0.0–0.2)

## 2023-06-25 LAB — IRON AND TIBC
Iron: 60 ug/dL (ref 45–182)
Saturation Ratios: 18 % (ref 17.9–39.5)
TIBC: 330 ug/dL (ref 250–450)
UIBC: 270 ug/dL

## 2023-06-25 LAB — FERRITIN: Ferritin: 54 ng/mL (ref 24–336)

## 2023-06-25 NOTE — Patient Instructions (Addendum)
EKG done today.  Labs done today. We will contact you only if your labs are abnormal.  No medication changes were made. Please continue all current medications as prescribed.  Your physician recommends that you schedule a follow-up appointment in: 6 months. Please contact our office in October to schedule a December appointment.   If you have any questions or concerns before your next appointment please send [place] a message through [place] or call our office at [phone].    TO LEAVE A MESSAGE FOR THE NURSE SELECT OPTION 2, PLEASE LEAVE A MESSAGE INCLUDING: YOUR NAME DATE OF BIRTH CALL BACK NUMBER REASON FOR CALL**this is important as we prioritize the call backs  YOU WILL RECEIVE A CALL BACK THE SAME DAY AS LONG AS YOU CALL BEFORE 4:00 PM   Do the following things EVERYDAY: Weigh yourself in the morning before breakfast. Write it down and keep it in a log. Take your medicines as prescribed Eat low salt foods--Limit salt (sodium) to 2000 mg per day.  Stay as active as you can everyday Limit all fluids for the day to less than 2 liters   At the [facility], you and your health needs are our priority. As part of our continuing mission to provide you with exceptional heart care, we have created designated Provider Care Teams. These Care Teams include your primary Cardiologist (physician) and Advanced Practice Providers (APPs- Physician Assistants and Nurse Practitioners) who all work together to provide you with the care you need, when you need it.   You may see any of the following providers on your designated Care Team at your next follow up: Dr Arvilla Meres Dr Marca Ancona Dr. Marcos Eke, NP Robbie Lis, [place] [facility] [place], [place] Brynda Peon, NP Karle Plumber, PharmD   Please be sure to bring in all your medications bottles to every appointment.    Thank you for choosing [facility]

## 2023-06-25 NOTE — Progress Notes (Signed)
H&V Care Navigation CSW Progress Note  Clinical Social Worker met with patient to discuss applying for food stamps.  Pt reports he had food stamps until the beginning of the year but they were taken away because he was not working sufficient hours to qualify.  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  No further concerns at this time.  Pt will inform CSW of his DDS case worker so I can reach out to see how we can help with his disability case.  Patient is participating in a Managed Medicaid Plan:  Yes  SDOH Screenings   Food Insecurity: Food Insecurity Present (06/25/2023)  Tobacco Use: Medium Risk (06/25/2023)   Burna Sis, LCSW Clinical Social Worker Advanced [facility] Failure Clinic Desk#: [phone] Cell#: [phone]

## 2023-06-25 NOTE — Addendum Note (Signed)
Encounter addended by: Jacklynn Ganong, FNP on: 06/25/2023 4:44 PM  Actions taken: Alternative orders not taken and original order placed, Order list changed

## 2023-06-26 ENCOUNTER — Ambulatory Visit (INDEPENDENT_AMBULATORY_CARE_PROVIDER_SITE_OTHER): Payer: Medicaid Other

## 2023-06-26 DIAGNOSIS — I255 Ischemic cardiomyopathy: Secondary | ICD-10-CM | POA: Diagnosis not present

## 2023-06-26 LAB — HEMOGLOBIN A1C
Hgb A1c MFr Bld: 5.7 % — ABNORMAL HIGH (ref 4.8–5.6)
Mean Plasma Glucose: 117 mg/dL

## 2023-06-30 ENCOUNTER — Telehealth (HOSPITAL_COMMUNITY): Payer: Self-pay | Admitting: Cardiology

## 2023-06-30 DIAGNOSIS — I5022 Chronic systolic (congestive) heart failure: Secondary | ICD-10-CM

## 2023-06-30 NOTE — Telephone Encounter (Signed)
Pt returned call Aware of 6/28 lab results

## 2023-07-01 ENCOUNTER — Ambulatory Visit (HOSPITAL_COMMUNITY)
Admission: RE | Admit: 2023-07-01 | Discharge: 2023-07-01 | Disposition: A | Discharge status: Home / Self Care | Payer: Medicaid Other | Source: Ambulatory Visit | Attending: Internal Medicine | Admitting: Internal Medicine

## 2023-07-01 DIAGNOSIS — I5022 Chronic systolic (congestive) heart failure: Secondary | ICD-10-CM | POA: Diagnosis present

## 2023-07-01 LAB — CUP PACEART REMOTE DEVICE CHECK
Battery Remaining Percentage: 51 %
Date Time Interrogation Session: 20240702005400
Implantable Lead Connection Status: 753985
Implantable Lead Implant Date: 20200219
Implantable Lead Location: 753862
Implantable Lead Model: 3501
Implantable Lead Serial Number: 168743
Implantable Pulse Generator Implant Date: 20200219
Pulse Gen Serial Number: 257847

## 2023-07-01 MED ORDER — SODIUM CHLORIDE 0.9 % IV SOLN
510.0000 mg | Freq: Once | INTRAVENOUS | Status: AC
  Administered 2023-07-01: 510 mg via INTRAVENOUS
  Filled 2023-07-01: qty 510

## 2023-07-10 NOTE — Progress Notes (Signed)
Remote ICD transmission.   

## 2023-07-11 ENCOUNTER — Other Ambulatory Visit (HOSPITAL_COMMUNITY): Payer: Self-pay

## 2023-07-11 ENCOUNTER — Other Ambulatory Visit: Payer: Self-pay

## 2023-07-11 ENCOUNTER — Other Ambulatory Visit (HOSPITAL_COMMUNITY): Payer: Self-pay | Admitting: Internal Medicine

## 2023-07-11 MED ORDER — CARVEDILOL 6.25 MG PO TABS
9.3750 mg | ORAL_TABLET | Freq: Two times a day (BID) | ORAL | 3 refills | Status: DC
  Filled 2023-07-11: qty 90, 30d supply, fill #0
  Filled 2023-08-15: qty 90, 30d supply, fill #1
  Filled 2023-09-16 – 2023-09-17 (×2): qty 90, 30d supply, fill #2
  Filled 2023-10-14 – 2023-11-21 (×2): qty 90, 30d supply, fill #3

## 2023-07-21 ENCOUNTER — Other Ambulatory Visit (HOSPITAL_COMMUNITY): Payer: Self-pay | Admitting: Internal Medicine

## 2023-07-22 ENCOUNTER — Other Ambulatory Visit (HOSPITAL_COMMUNITY): Payer: Self-pay

## 2023-07-22 ENCOUNTER — Other Ambulatory Visit: Payer: Self-pay

## 2023-07-22 MED ORDER — CLOPIDOGREL BISULFATE 75 MG PO TABS
75.0000 mg | ORAL_TABLET | Freq: Every day | ORAL | 0 refills | Status: DC
  Filled 2023-07-22: qty 15, 15d supply, fill #0
  Filled 2023-08-15 (×2): qty 3, 3d supply, fill #1

## 2023-07-28 ENCOUNTER — Other Ambulatory Visit (HOSPITAL_COMMUNITY): Payer: Self-pay

## 2023-07-28 MED ORDER — AMOXICILLIN 500 MG PO CAPS
500.0000 mg | ORAL_CAPSULE | Freq: Three times a day (TID) | ORAL | 1 refills | Status: DC
  Filled 2023-07-28: qty 25, 9d supply, fill #0
  Filled 2023-08-27: qty 25, 9d supply, fill #1

## 2023-07-31 ENCOUNTER — Telehealth: Payer: Self-pay

## 2023-07-31 ENCOUNTER — Telehealth: Payer: Self-pay | Admitting: Pharmacist

## 2023-07-31 ENCOUNTER — Encounter: Payer: Self-pay | Admitting: Student

## 2023-07-31 ENCOUNTER — Ambulatory Visit: Payer: Medicaid Other | Attending: Cardiovascular Disease | Admitting: Student

## 2023-07-31 ENCOUNTER — Other Ambulatory Visit (HOSPITAL_COMMUNITY): Payer: Self-pay

## 2023-07-31 VITALS — BP 103/71 | HR 69

## 2023-07-31 DIAGNOSIS — E7849 Other hyperlipidemia: Secondary | ICD-10-CM

## 2023-07-31 DIAGNOSIS — E785 Hyperlipidemia, unspecified: Secondary | ICD-10-CM | POA: Insufficient documentation

## 2023-07-31 DIAGNOSIS — Z6841 Body Mass Index (BMI) 40.0 and over, adult: Secondary | ICD-10-CM

## 2023-07-31 MED ORDER — NITROGLYCERIN 0.4 MG SL SUBL
0.4000 mg | SUBLINGUAL_TABLET | SUBLINGUAL | 0 refills | Status: AC | PRN
  Filled 2023-07-31: qty 50, 17d supply, fill #0

## 2023-07-31 NOTE — Telephone Encounter (Signed)
Pharmacy Patient Advocate Encounter   Received notification from Physician's Office/PHARMD PATEL that prior authorization for REPATHA is required/requested.   Insurance verification completed.   The patient is insured through [facility] .   Per test claim: PA required; PA submitted to [facility] via CoverMyMeds Key/confirmation #/EOC [id number]      Status is pending

## 2023-07-31 NOTE — Telephone Encounter (Signed)
Pharmacy Patient Advocate Encounter   Received notification from Physician's Office/PHARMD PATELthat prior authorization for [facility] is required/requested.   Insurance verification completed.   The patient is insured through [facility] .   Per test claim: PA required; PA submitted to [facility] via CoverMyMeds Key/confirmation #/EOC [id number]    Status is pending

## 2023-07-31 NOTE — Patient Instructions (Signed)
Your Results:             Your most recent labs Goal  Total Cholesterol 176 < 200  Triglycerides 102 < 150  HDL (happy/good cholesterol) 36 > 40  LDL (lousy/bad cholesterol 120 < 55   Medication changes: We will start the process to get PCSK9i (Repatha or Praluent) covered by your insurance.  Once the prior authorization is complete, we will call you to let you know and confirm pharmacy information.  Praluent is a cholesterol medication that improved your body's ability to get rid of "bad cholesterol" known as LDL. It can lower your LDL up to 60%. It is an injection that is given under the skin every 2 weeks. The most common side effects of Praluent include runny nose, symptoms of the common cold, rarely flu or flu-like symptoms, back/muscle pain in about 3-4% of the patients, and redness, pain, or bruising at the injection site.    Repatha is a cholesterol medication that improved your body's ability to get rid of "bad cholesterol" known as LDL. It can lower your LDL up to 60%! It is an injection that is given under the skin every 2 weeks. The most common side effects of Repatha include runny nose, symptoms of the common cold, rarely flu or flu-like symptoms, back/muscle pain in about 3-4% of the patients, and redness, pain, or bruising at the injection site.   Lab orders: We want to repeat labs after 2-3 months.  We will send you a lab order to remind you once we get closer to that time.

## 2023-07-31 NOTE — Telephone Encounter (Signed)
Wegovy PA and Repatha PA initiated

## 2023-07-31 NOTE — Assessment & Plan Note (Signed)
Assessment and Plan:  Baseline BMI 40.22 kg/m2, baseline Weight 310 lbs  Current weight loss medications: none  Have been following healthy diet and go to gym 3 times per week (30 min cardio and 20 min resistance exercise) unsuccessful achieving meaningful weight loss with life style interventions Given hx of ACS insurance may cover semaglutide for weight loss. Will assess coverage  Dose titration schedule, potential s/e of semaglutide and its management has been discussed with patient

## 2023-07-31 NOTE — Progress Notes (Signed)
Patient ID: Sergio Hicks                 DOB: 1981/07/23                    MRN: [id number]      HPI: Sergio Hicks is a 42 y.o. male patient referred to lipid clinic by Prince Rome, FNP. PMH is significant for obesity, systolic heart failure due to iCM, CAD s/p OOH anterior STEMI with DES to proximal/mid LAD in 1/19, hx of ETOH abuse, and tobacco abuse   Today patient presented for lipid clinic. Reports he forgets taking his night medications 3-4 times per month. He uses pill box encouraged him to set reminder to improve adherence. His home SBP ~ 103-112 range. He tolerates all his medications well without any side effects. He has not used furosemide in long time.  Reviewed options for lowering LDL cholesterol, including ezetimibe, PCSK-9 inhibitors, and weight loss GLP1 Discussed mechanisms of action, dosing, side effects and potential decreases in LDL cholesterol and weight loss from GLP1.  Also reviewed cost information and potential options for patient assistance.     Current Medications: Lipitor 80 mg daily  Intolerances:  Risk Factors: Premature ASCVD, obesity, systolic heart failure due to iCM, CAD s/p OOH anterior STEMI with DES to proximal/mid LAD in 1/19, ETOH abuse, and tobacco abuse  LDL goal: <55 mg/dl Last LDLc 253 mg/dl Diet: try to eat healthy home cooked meals  Grilled chicken, salads, pasta, bread. ( On limited income some healthy food options are cost prohibitive)   Exercise: gym 3 times per week - cardio for 30 min and weights -20 min   Family History: was adopted so do not know   Social History: alcohol: none  Smoking: quit 6.5 years ago  Edible marijuana 5 mg THC  - use varies (none to 2-3 times per week)  Labs: Lipid Panel     Component Value Date/Time   CHOL 176 06/25/2023 0921   TRIG 102 06/25/2023 0921   HDL 36 (L) 06/25/2023 0921   CHOLHDL 4.9 06/25/2023 0921   VLDL 20 06/25/2023 0921   LDLCALC 120 (H) 06/25/2023 0921    Past Medical History:   Diagnosis Date   Alcohol abuse    Asthma    Chronic systolic CHF (congestive heart failure) (HCC)    Coronary artery disease    a. OOH/late presenting MI 2019 s/p DES to prox/mid LAD.   Ischemic cardiomyopathy    Mild sleep apnea    Myocardial infarction [facility])    Tobacco abuse     Current Outpatient Medications on File Prior to Visit  Medication Sig Dispense Refill   albuterol (PROVENTIL HFA;VENTOLIN HFA) 108 (90 Base) MCG/ACT inhaler Inhale 1-2 puffs into the lungs every 6 (six) hours as needed for wheezing or shortness of breath.     amoxicillin (AMOXIL) 500 MG capsule Take 1 capsule (500 mg total) by mouth 3 (three) times daily until gone 25 capsule 1   aspirin EC 81 MG EC tablet Take 1 tablet (81 mg total) by mouth daily. 30 tablet 6   atorvastatin (LIPITOR) 80 MG tablet Take 1 tablet (80 mg total) by mouth daily. 30 tablet 11   carvedilol (COREG) 6.25 MG tablet Take 1.5 tablets (9.375 mg total) by mouth 2 (two) times daily. NEEDS FOLLOW UP APPOINTMENT FOR ANYMORE REFILLS 90 tablet 3   clopidogrel (PLAVIX) 75 MG tablet Take 1 tablet (75 mg) by mouth daily with breakfast.--need follow up  appointment 15 tablet 0   dapagliflozin propanediol (FARXIGA) 10 MG TABS tablet Take 1 tablet by mouth daily before breakfast. 90 tablet 3   furosemide (LASIX) 40 MG tablet Take 1 tablet (40 mg total) by mouth as needed. 30 tablet 3   loratadine (CLARITIN) 10 MG tablet Take 10 mg by mouth daily as needed for allergies.     Multiple Vitamin (MULTIVITAMIN) tablet Take 1 tablet by mouth daily.     potassium chloride SA (KLOR-CON) 20 MEQ tablet Take 2 tablets (40 mEq total) by mouth daily as needed (when taking furosemide). 30 tablet 3   sacubitril-valsartan (ENTRESTO) 49-51 MG Take 1 tablet by mouth 2 (two) times daily. 60 tablet 2   spironolactone (ALDACTONE) 25 MG tablet TAKE 1 TABLET BY MOUTH ONCE A DAY 30 tablet 11   No current facility-administered medications on file prior to visit.    No Known  Allergies  Assessment/Plan:  1. Hyperlipidemia -  Problem  Hyperlipidemia   Current Medications: Lipitor 80 mg daily  Intolerances: none Risk Factors: Premature ASCVD, obesity, systolic heart failure due to iCM, CAD s/p OOH anterior STEMI with DES to proximal/mid LAD in 1/19, hx of ETOH abuse, and tobacco abuse  LDL goal: <55 mg/dl Last LDLc 147 mg/dl (82/9562)   Obesity   Hyperlipidemia Assessment:  LDL goal: < 55 mg/dl last LDLc 130 mg/dl (86/5784) Tolerates high intensity statins well without any side effects  Risk factors Premature ASCVD, obesity, systolic heart failure due to iCM, CAD s/p OOH anterior STEMI with DES to proximal/mid LAD in 1/19,  Discussed next potential options (ezetimibe and PCSK-9 i); cost, dosing efficacy, side effects  Exercise 3 times per week, eats low fat healthy home cooked meals  Family hx is unavailable - he was adopted   Plan: Continue taking current medications (Lipitor 80 mg daily) Reiterated importance of regular exercise and healthy diet for overall health  Will apply for PA for PCSK9i; will inform patient upon approval  Lipid lab due in 2-3 months after starting PCSK9i   Obesity Assessment and Plan:  Baseline BMI 40.22 kg/m2, baseline Weight 310 lbs  Current weight loss medications: none  Have been following healthy diet and go to gym 3 times per week (30 min cardio and 20 min resistance exercise) unsuccessful achieving meaningful weight loss with life style interventions Given hx of ACS insurance may cover semaglutide for weight loss. Will assess coverage  Dose titration schedule, potential s/e of semaglutide and its management has been discussed with patient       Thank you,  Carmela Hurt, Pharm.D [facility] [address], [place], [place] [address]  Phone: [phone]; Fax: [phone]

## 2023-07-31 NOTE — Assessment & Plan Note (Signed)
Assessment:  LDL goal: < 55 mg/dl last LDLc 235 mg/dl (57/3220) Tolerates high intensity statins well without any side effects  Risk factors Premature ASCVD, obesity, systolic heart failure due to iCM, CAD s/p OOH anterior STEMI with DES to proximal/mid LAD in 1/19,  Discussed next potential options (ezetimibe and PCSK-9 i); cost, dosing efficacy, side effects  Exercise 3 times per week, eats low fat healthy home cooked meals  Family hx is unavailable - he was adopted   Plan: Continue taking current medications (Lipitor 80 mg daily) Reiterated importance of regular exercise and healthy diet for overall health  Will apply for PA for PCSK9i; will inform patient upon approval  Lipid lab due in 2-3 months after starting [facility]

## 2023-08-01 ENCOUNTER — Other Ambulatory Visit (HOSPITAL_COMMUNITY): Payer: Self-pay

## 2023-08-01 MED ORDER — WEGOVY 0.25 MG/0.5ML ~~LOC~~ SOAJ
0.2500 mg | SUBCUTANEOUS | 0 refills | Status: DC
  Filled 2023-08-01: qty 2, 28d supply, fill #0

## 2023-08-01 NOTE — Telephone Encounter (Signed)
Pharmacy Patient Advocate Encounter  Received notification from [facility] that Prior Authorization for {WEGOVY has been  APPROVED  FROM 07/31/23 TO 1.28.25  PA #/Case ID/Reference #: [id number]

## 2023-08-01 NOTE — Addendum Note (Signed)
Addended by: Tylene Fantasia on: 08/01/2023 02:28 PM   Modules accepted: Orders

## 2023-08-05 ENCOUNTER — Other Ambulatory Visit (HOSPITAL_COMMUNITY): Payer: Self-pay

## 2023-08-06 ENCOUNTER — Other Ambulatory Visit (HOSPITAL_COMMUNITY): Payer: Self-pay

## 2023-08-07 ENCOUNTER — Other Ambulatory Visit: Payer: Self-pay

## 2023-08-07 ENCOUNTER — Other Ambulatory Visit (HOSPITAL_COMMUNITY): Payer: Self-pay

## 2023-08-07 MED ORDER — REPATHA SURECLICK 140 MG/ML ~~LOC~~ SOAJ
140.0000 mg | SUBCUTANEOUS | 3 refills | Status: AC
  Filled 2023-08-07 – 2023-08-15 (×2): qty 6, 84d supply, fill #0
  Filled 2023-11-03: qty 6, 84d supply, fill #1
  Filled 2024-02-19: qty 6, 84d supply, fill #2

## 2023-08-07 NOTE — Addendum Note (Signed)
Addended by: Tylene Fantasia on: 08/07/2023 08:21 AM   Modules accepted: Orders

## 2023-08-15 ENCOUNTER — Other Ambulatory Visit: Payer: Self-pay

## 2023-08-15 ENCOUNTER — Other Ambulatory Visit (HOSPITAL_COMMUNITY): Payer: Self-pay

## 2023-08-15 ENCOUNTER — Other Ambulatory Visit (HOSPITAL_COMMUNITY): Payer: Self-pay | Admitting: Internal Medicine

## 2023-08-18 ENCOUNTER — Other Ambulatory Visit: Payer: Self-pay

## 2023-08-19 ENCOUNTER — Other Ambulatory Visit (HOSPITAL_COMMUNITY): Payer: Self-pay

## 2023-08-20 ENCOUNTER — Other Ambulatory Visit (HOSPITAL_COMMUNITY): Payer: Self-pay | Admitting: Internal Medicine

## 2023-08-20 ENCOUNTER — Other Ambulatory Visit (HOSPITAL_COMMUNITY): Payer: Self-pay

## 2023-08-20 MED ORDER — CLOPIDOGREL BISULFATE 75 MG PO TABS
75.0000 mg | ORAL_TABLET | Freq: Every day | ORAL | 1 refills | Status: DC
  Filled 2023-08-20: qty 30, 30d supply, fill #0
  Filled 2023-09-16 – 2023-09-17 (×2): qty 30, 30d supply, fill #1

## 2023-08-22 ENCOUNTER — Other Ambulatory Visit (HOSPITAL_COMMUNITY): Payer: Self-pay

## 2023-08-27 ENCOUNTER — Other Ambulatory Visit (HOSPITAL_COMMUNITY): Payer: Self-pay | Admitting: Internal Medicine

## 2023-08-27 DIAGNOSIS — I5022 Chronic systolic (congestive) heart failure: Secondary | ICD-10-CM

## 2023-08-28 ENCOUNTER — Other Ambulatory Visit (HOSPITAL_COMMUNITY): Payer: Self-pay

## 2023-08-28 MED ORDER — DAPAGLIFLOZIN PROPANEDIOL 10 MG PO TABS
10.0000 mg | ORAL_TABLET | Freq: Every day | ORAL | 3 refills | Status: DC
  Filled 2023-08-28: qty 90, 90d supply, fill #0
  Filled 2023-10-28 – 2023-11-21 (×2): qty 90, 90d supply, fill #1
  Filled 2024-02-19: qty 90, 90d supply, fill #2

## 2023-09-02 ENCOUNTER — Other Ambulatory Visit (HOSPITAL_COMMUNITY): Payer: Self-pay

## 2023-09-02 MED ORDER — WEGOVY 0.5 MG/0.5ML ~~LOC~~ SOAJ
0.5000 mg | SUBCUTANEOUS | 0 refills | Status: DC
  Filled 2023-09-02: qty 2, 28d supply, fill #0

## 2023-09-02 NOTE — Addendum Note (Signed)
Addended by: Tylene Fantasia on: 09/02/2023 10:01 AM   Modules accepted: Orders

## 2023-09-02 NOTE — Telephone Encounter (Signed)
Call to titrate Wegovy dose, reports he still has one week dose left. He is tolerating step 1 well. He is less hungry and might have lost some weight but unable to weigh at home as scale broke.  In agreement to move on to next step.   Wegovy 0.5 mg once week prescription sent to [facility].  F/u for dose titration due in 4 weeks

## 2023-09-08 ENCOUNTER — Other Ambulatory Visit (HOSPITAL_COMMUNITY): Payer: Self-pay

## 2023-09-16 ENCOUNTER — Other Ambulatory Visit (HOSPITAL_COMMUNITY): Payer: Self-pay

## 2023-09-16 ENCOUNTER — Other Ambulatory Visit (HOSPITAL_COMMUNITY): Payer: Self-pay | Admitting: Internal Medicine

## 2023-09-16 MED ORDER — SPIRONOLACTONE 25 MG PO TABS
25.0000 mg | ORAL_TABLET | Freq: Every day | ORAL | 11 refills | Status: DC
  Filled 2023-09-16: qty 30, 30d supply, fill #0
  Filled 2023-10-14 – 2023-10-28 (×2): qty 30, 30d supply, fill #1
  Filled 2023-11-21: qty 30, 30d supply, fill #2
  Filled 2024-01-14: qty 30, 30d supply, fill #3
  Filled 2024-02-19: qty 30, 30d supply, fill #4
  Filled 2024-03-25: qty 30, 30d supply, fill #5
  Filled 2024-04-16 – 2024-04-27 (×2): qty 30, 30d supply, fill #6

## 2023-09-17 ENCOUNTER — Other Ambulatory Visit (HOSPITAL_COMMUNITY): Payer: Self-pay

## 2023-09-18 ENCOUNTER — Telehealth (HOSPITAL_COMMUNITY): Payer: Self-pay

## 2023-09-18 NOTE — Telephone Encounter (Signed)
Received clearance request for patient to have extractions- need additional information including the name of provider and office. Left message for patient to call back to follow up on this.

## 2023-09-22 NOTE — Telephone Encounter (Signed)
Pt aware Advised to have surgeon/provider office resubmit request on letterhead including procedure/anesthesia and provider   Pt voiced understanding

## 2023-09-25 ENCOUNTER — Telehealth (HOSPITAL_COMMUNITY): Payer: Self-pay

## 2023-09-25 NOTE — Telephone Encounter (Signed)
  ADVANCED HEART FAILURE CLINIC   Pre-operative Risk Assessment   HEARTCARE STAFF-IMPORTANT INSTRUCTIONS 1 Red and Blue Text will auto delete once note is signed or closed. 2 Press F2 to navigate through template.   3 On drop down lists, L click to select >> R click to activate next field 4 Reason for Visit format is IMPORTANT!!  See Directions on No. 2 below. 5 Please review chart to determine if there is already a clearance note open for this procedure!!  DO NOT duplicate if a note already exists!!    :1}     Request for Surgical Clearance    Procedure:  Dental Extraction - Amount of Teeth to be Pulled:  4  Date of Surgery:  Clearance TBD                                 Surgeon:  Dr. Janae Bridgeman Surgeon's Group or Practice Name:  [facility] [place] Phone number:  [phone] Fax number:  [phone]   Type of Clearance Requested:   - Medical    Type of Anesthesia:  Local    Additional requests/questions:  Please fax a copy of the medical clearance  to the surgeon's office.  Signed, Linda Hedges   09/25/2023, 1:53 PM   Advanced Heart Failure Clinic [facility] [address] Heart and Vascular [place] [address] [phone] (office) [phone] (fax)

## 2023-09-26 NOTE — Telephone Encounter (Signed)
Clearance form faxed via Epic

## 2023-09-29 ENCOUNTER — Telehealth: Payer: Self-pay | Admitting: Pharmacist

## 2023-09-29 ENCOUNTER — Telehealth (HOSPITAL_COMMUNITY): Payer: Self-pay | Admitting: Licensed Clinical Social Worker

## 2023-09-29 DIAGNOSIS — E66813 Obesity, class 3: Secondary | ICD-10-CM

## 2023-09-29 NOTE — Telephone Encounter (Signed)
Call patient for [facility] dose titration.  N/A LVM.

## 2023-09-29 NOTE — Telephone Encounter (Signed)
H&V Care Navigation CSW Progress Note  Clinical Social Worker received message from pt requesting help with letter for food stamps explaining his limitations to work.  CSW reviewed with provider and letter sent to patient.  Patient is participating in a Managed Medicaid Plan:  Yes  SDOH Screenings   Food Insecurity: Food Insecurity Present (06/25/2023)  Tobacco Use: Medium Risk (07/31/2023)   Burna Sis, LCSW Clinical Social Worker Advanced Heart Failure Clinic Desk#: [phone] Cell#: [phone]

## 2023-09-30 ENCOUNTER — Other Ambulatory Visit (HOSPITAL_COMMUNITY): Payer: Self-pay

## 2023-09-30 MED ORDER — WEGOVY 1 MG/0.5ML ~~LOC~~ SOAJ
1.0000 mg | SUBCUTANEOUS | 0 refills | Status: DC
Start: 2023-09-30 — End: 2023-11-03
  Filled 2023-09-30: qty 2, 28d supply, fill #0

## 2023-09-30 NOTE — Addendum Note (Signed)
Addended by: Tylene Fantasia on: 09/30/2023 09:52 AM   Modules accepted: Orders

## 2023-10-01 ENCOUNTER — Other Ambulatory Visit (HOSPITAL_COMMUNITY): Payer: Self-pay

## 2023-10-02 ENCOUNTER — Other Ambulatory Visit (HOSPITAL_COMMUNITY): Payer: Self-pay

## 2023-10-14 ENCOUNTER — Other Ambulatory Visit: Payer: Self-pay

## 2023-10-14 ENCOUNTER — Other Ambulatory Visit (HOSPITAL_COMMUNITY): Payer: Self-pay

## 2023-10-21 ENCOUNTER — Ambulatory Visit (INDEPENDENT_AMBULATORY_CARE_PROVIDER_SITE_OTHER): Payer: Medicaid Other

## 2023-10-21 DIAGNOSIS — I5022 Chronic systolic (congestive) heart failure: Secondary | ICD-10-CM

## 2023-10-21 DIAGNOSIS — I255 Ischemic cardiomyopathy: Secondary | ICD-10-CM

## 2023-10-22 LAB — CUP PACEART REMOTE DEVICE CHECK
Battery Remaining Percentage: 47 %
Date Time Interrogation Session: 20241022214300
HighPow Impedance: 110 Ohm
Implantable Lead Connection Status: 753985
Implantable Lead Implant Date: 20200219
Implantable Lead Location: 753862
Implantable Lead Model: 3501
Implantable Lead Serial Number: 168743
Implantable Pulse Generator Implant Date: 20200219
Pulse Gen Serial Number: 257847

## 2023-10-25 ENCOUNTER — Other Ambulatory Visit (HOSPITAL_COMMUNITY): Payer: Self-pay

## 2023-10-28 ENCOUNTER — Other Ambulatory Visit (HOSPITAL_COMMUNITY): Payer: Self-pay

## 2023-10-28 ENCOUNTER — Other Ambulatory Visit: Payer: Self-pay

## 2023-10-28 ENCOUNTER — Other Ambulatory Visit (HOSPITAL_COMMUNITY): Payer: Self-pay | Admitting: Internal Medicine

## 2023-10-28 ENCOUNTER — Other Ambulatory Visit: Payer: Self-pay | Admitting: Internal Medicine

## 2023-10-28 DIAGNOSIS — E66813 Obesity, class 3: Secondary | ICD-10-CM

## 2023-10-28 MED ORDER — CLOPIDOGREL BISULFATE 75 MG PO TABS
75.0000 mg | ORAL_TABLET | Freq: Every day | ORAL | 1 refills | Status: DC
  Filled 2023-10-28: qty 30, 30d supply, fill #0

## 2023-10-29 ENCOUNTER — Other Ambulatory Visit (HOSPITAL_COMMUNITY): Payer: Self-pay

## 2023-10-30 NOTE — Telephone Encounter (Signed)
Please update regarding plavix hold prior and after surgery   Sergio Hicks [phone]

## 2023-11-03 ENCOUNTER — Other Ambulatory Visit: Payer: Self-pay | Admitting: Internal Medicine

## 2023-11-03 ENCOUNTER — Other Ambulatory Visit: Payer: Self-pay

## 2023-11-03 ENCOUNTER — Other Ambulatory Visit (HOSPITAL_COMMUNITY): Payer: Self-pay

## 2023-11-03 MED ORDER — WEGOVY 1.7 MG/0.75ML ~~LOC~~ SOAJ
1.7000 mg | SUBCUTANEOUS | 0 refills | Status: DC
  Filled 2023-11-03: qty 3, 28d supply, fill #0

## 2023-11-03 NOTE — Addendum Note (Signed)
Addended by: Tylene Fantasia on: 11/03/2023 11:36 AM   Modules accepted: Orders

## 2023-11-03 NOTE — Telephone Encounter (Addendum)
Forms refaxed via epic with updated information   Patient aware that he can stop his plavix per Shanda Bumps

## 2023-11-05 ENCOUNTER — Telehealth: Payer: Self-pay | Admitting: Pharmacist

## 2023-11-05 DIAGNOSIS — E7849 Other hyperlipidemia: Secondary | ICD-10-CM

## 2023-11-05 NOTE — Telephone Encounter (Signed)
Repatha started Aug 2024. Due for follow up FLP. MyChart reminder sent

## 2023-11-07 NOTE — Progress Notes (Signed)
Remote ICD transmission.   

## 2023-11-19 ENCOUNTER — Other Ambulatory Visit (HOSPITAL_COMMUNITY): Payer: Self-pay

## 2023-11-21 ENCOUNTER — Other Ambulatory Visit: Payer: Self-pay

## 2023-11-21 ENCOUNTER — Other Ambulatory Visit (HOSPITAL_COMMUNITY): Payer: Self-pay

## 2023-11-21 ENCOUNTER — Other Ambulatory Visit: Payer: Self-pay | Admitting: Internal Medicine

## 2023-11-21 MED ORDER — WEGOVY 2.4 MG/0.75ML ~~LOC~~ SOAJ
2.4000 mg | SUBCUTANEOUS | 11 refills | Status: AC
Start: 2023-11-21 — End: ?
  Filled 2023-11-21 – 2023-11-24 (×2): qty 3, 28d supply, fill #0
  Filled 2023-12-17: qty 3, 28d supply, fill #1
  Filled 2024-01-14: qty 3, 28d supply, fill #2
  Filled 2024-02-27: qty 3, 28d supply, fill #3
  Filled 2024-04-30: qty 3, 28d supply, fill #4
  Filled 2024-05-25: qty 3, 28d supply, fill #5
  Filled 2024-11-02: qty 3, 28d supply, fill #6

## 2023-11-24 ENCOUNTER — Other Ambulatory Visit (HOSPITAL_COMMUNITY): Payer: Self-pay

## 2023-11-25 ENCOUNTER — Other Ambulatory Visit (HOSPITAL_COMMUNITY): Payer: Self-pay

## 2023-11-26 ENCOUNTER — Ambulatory Visit: Payer: Medicaid Other | Admitting: Plastic Surgery

## 2023-11-26 ENCOUNTER — Encounter: Payer: Self-pay | Admitting: Plastic Surgery

## 2023-11-26 VITALS — BP 110/66 | HR 64

## 2023-11-26 DIAGNOSIS — N62 Hypertrophy of breast: Secondary | ICD-10-CM | POA: Diagnosis not present

## 2023-11-26 DIAGNOSIS — I255 Ischemic cardiomyopathy: Secondary | ICD-10-CM

## 2023-11-26 NOTE — Progress Notes (Signed)
Sergio Hicks returns today approximately year after his first evaluation for bilateral gynecomastia.  At that appointment he was told that he was not a candidate for surgery due to his ischemic cardiomyopathy and need for Plavix.  Onset time patient has been allowed to stop his Plavix and is interested now in pursuing a simple mastectomy with free nipple graft for his gynecomastia.   Last echo in June 2024 showed an ejection fraction of 30 to 35%.  Patient reports good exercise tolerance swimming 3 to 4 days/week and lifting weights.   On examination he has lost weight but still has extremely large breasts with grade 4 gynecomastia.   Discussed options for management with him.  Treatment of gynecomastia as large as his is not a simple straightforward problem.  He would likely get the best result from a simple mastectomy with free nipple graft.  I would be willing to entertain treatment with liposuction and staged skin excision if he was interested.  I discussed mastectomy with him and showed him where the scars would be.  He understands if he has a free nipple graft that he is likely to have epidermal lysis and partial loss of the areola.  Is possible that he will lose the entire nipple and areola.  He will need drains postoperatively and his postoperative limitations will include no heavy lifting greater than 20 pounds, no vigorous activity, no submerging the incisions in water for 6 weeks.  He will need a compressive garment for the full 6 weeks.  Prior to scheduling surgery he will need cardiac clearance.  A clearance form was filled out today.  I spent 30 minutes reviewing the patient's chart and past findings, examining the patient, reviewing treatment options with the patient, documenting care.

## 2023-12-02 ENCOUNTER — Telehealth (HOSPITAL_COMMUNITY): Payer: Self-pay

## 2023-12-02 NOTE — Telephone Encounter (Signed)
Clearance faxed via Epic.  

## 2023-12-02 NOTE — Telephone Encounter (Signed)
  ADVANCED HEART FAILURE CLINIC   Pre-operative Risk Assessment   HEARTCARE STAFF-IMPORTANT INSTRUCTIONS 1 Red and Blue Text will auto delete once note is signed or closed. 2 Press F2 to navigate through template.   3 On drop down lists, L click to select >> R click to activate next field 4 Reason for Visit format is IMPORTANT!!  See Directions on No. 2 below. 5 Please review chart to determine if there is already a clearance note open for this procedure!!  DO NOT duplicate if a note already exists!!    :1}      Request for Surgical Clearance    Procedure:   Gynecomastectomy   Date of Surgery:  Clearance TBD                                 Surgeon:  Dr. Weyman Croon  Surgeon's Group or Practice Name:  [facility] Phone number:  [phone] Fax number:  [phone]   Type of Clearance Requested:   - Medical    Type of Anesthesia:  General    Additional requests/questions:  Please fax a copy of surgical clearance  to the surgeon's office.  Signed, Linda Hedges   12/02/2023, 3:13 PM   Advanced Heart Failure Clinic [facility] [address] Heart and Vascular [place] [address] [phone] (office) [phone] (fax)

## 2023-12-08 ENCOUNTER — Other Ambulatory Visit (HOSPITAL_COMMUNITY): Payer: Self-pay

## 2023-12-17 ENCOUNTER — Other Ambulatory Visit (HOSPITAL_COMMUNITY): Payer: Self-pay | Admitting: Internal Medicine

## 2023-12-17 ENCOUNTER — Other Ambulatory Visit: Payer: Self-pay

## 2023-12-18 ENCOUNTER — Other Ambulatory Visit (HOSPITAL_COMMUNITY): Payer: Self-pay

## 2023-12-18 MED ORDER — CARVEDILOL 6.25 MG PO TABS
9.3750 mg | ORAL_TABLET | Freq: Two times a day (BID) | ORAL | 3 refills | Status: DC
  Filled 2023-12-18: qty 90, 30d supply, fill #0
  Filled 2024-01-14: qty 90, 30d supply, fill #1
  Filled 2024-02-19: qty 90, 30d supply, fill #2
  Filled 2024-03-25: qty 90, 30d supply, fill #3

## 2024-01-14 ENCOUNTER — Other Ambulatory Visit: Payer: Self-pay

## 2024-01-20 ENCOUNTER — Ambulatory Visit (INDEPENDENT_AMBULATORY_CARE_PROVIDER_SITE_OTHER): Payer: Medicaid Other

## 2024-01-20 DIAGNOSIS — I5022 Chronic systolic (congestive) heart failure: Secondary | ICD-10-CM

## 2024-01-20 DIAGNOSIS — I255 Ischemic cardiomyopathy: Secondary | ICD-10-CM

## 2024-01-20 LAB — CUP PACEART REMOTE DEVICE CHECK
Battery Remaining Percentage: 44 %
Date Time Interrogation Session: 20250121092800
HighPow Impedance: 95 Ohm
Implantable Lead Connection Status: 753985
Implantable Lead Implant Date: 20200219
Implantable Lead Location: 753862
Implantable Lead Model: 3501
Implantable Lead Serial Number: 168743
Implantable Pulse Generator Implant Date: 20200219
Pulse Gen Serial Number: 257847

## 2024-01-21 ENCOUNTER — Encounter: Payer: Self-pay | Admitting: Internal Medicine

## 2024-02-19 ENCOUNTER — Other Ambulatory Visit (HOSPITAL_COMMUNITY): Payer: Self-pay

## 2024-02-19 ENCOUNTER — Other Ambulatory Visit: Payer: Self-pay

## 2024-02-26 ENCOUNTER — Encounter (HOSPITAL_COMMUNITY): Payer: Self-pay | Admitting: Internal Medicine

## 2024-02-27 ENCOUNTER — Telehealth (HOSPITAL_COMMUNITY): Payer: Self-pay

## 2024-02-27 NOTE — Telephone Encounter (Signed)
 Advanced Heart Failure Patient Advocate Encounter  Patient messaged office with concerns about medication coverage if Medicaid were to be discontinued. Attempted to contact patient by phone, left voicemail.  Burnell Blanks, CPhT Rx Patient Advocate Phone: [phone]

## 2024-02-27 NOTE — Telephone Encounter (Signed)
 Stef, can you reach out to him and discuss PAP process and HF fund if Medicaid were to be removed. I think it would be helpful for him to know the process, as well as who to contact should that happen.

## 2024-02-27 NOTE — Telephone Encounter (Signed)
 Spoke to patient about assistance programs, HF fund / AR accounts, etc in case of the event the pt were to become uninsured. Patient expressed understanding and gratitude.

## 2024-02-27 NOTE — Telephone Encounter (Signed)
 Left message on voicemail for patient to call me back. Will try to reach pt again next week if my call is not returned.

## 2024-03-01 NOTE — Progress Notes (Signed)
 Remote ICD transmission.

## 2024-03-02 ENCOUNTER — Other Ambulatory Visit (HOSPITAL_COMMUNITY): Payer: Self-pay

## 2024-03-02 ENCOUNTER — Encounter (HOSPITAL_COMMUNITY): Payer: Self-pay | Admitting: Internal Medicine

## 2024-03-03 ENCOUNTER — Other Ambulatory Visit (HOSPITAL_COMMUNITY): Payer: Self-pay

## 2024-03-03 MED ORDER — HYDROCODONE-ACETAMINOPHEN 5-325 MG PO TABS
1.0000 | ORAL_TABLET | Freq: Four times a day (QID) | ORAL | 0 refills | Status: DC | PRN
  Filled 2024-03-03: qty 6, 4d supply, fill #0

## 2024-03-03 MED ORDER — CHLORHEXIDINE GLUCONATE 0.12 % MT SOLN
OROMUCOSAL | 0 refills | Status: DC
  Filled 2024-03-03: qty 473, 15d supply, fill #0

## 2024-03-05 ENCOUNTER — Other Ambulatory Visit (HOSPITAL_COMMUNITY): Payer: Self-pay

## 2024-03-05 ENCOUNTER — Other Ambulatory Visit: Payer: Self-pay

## 2024-03-05 ENCOUNTER — Encounter (HOSPITAL_COMMUNITY): Payer: Self-pay

## 2024-03-05 ENCOUNTER — Telehealth: Payer: Self-pay | Admitting: Pharmacy Technician

## 2024-03-05 NOTE — Telephone Encounter (Addendum)
 Pharmacy Patient Advocate Encounter   Received notification from Patient Advice Request messages that prior authorization for wegovy is required/requested.   Insurance verification completed.   The patient is insured through [facility] .   Per test claim: PA required; PA started via CoverMyMeds. KEY [id number] . Please see clinical question(s) below that I am not finding the answer to in her chart and advise.    Please provide the patient's baseline and current weight (within the past 45 days of this prior authorization request). Please ensure you note pounds (lbs) or kilograms (kg) for the weight values.   Can you get an updated weight? Last weight found was more than 45 days        Tylene Fantasia, [facility] to Rx Prior Auth Team      03/03/24 12:50 PM Can we submit renewal for [place] PA? Thank you! Olene Floss, RPH-CPP to Rx Prior Auth Team     03/03/24 12:37 PM I think this might need PA March 02, 2024      03/02/24  2:19 PM Theresia Bough, CMA routed this conversation to Tylene Fantasia, [facility]  Cv Div Pharmd Reginold Beale to [facility] Clinical Pool (supporting Dolores Patty, MD)      03/02/24  1:33 PM Reginal Lutes Theresia Bough, CMA to [name]      03/02/24  1:32 PM Which medication?  Last read by [name] at 12:45 PM on 03/03/2024. [name] to P [facility] Clinical Pool (supporting Dolores Patty, MD)      03/02/24 12:43 PM So I put in for a refill and went to pick up but they said they needed authorization from you all and have sent something.  So just wondering if that has gone through yet or no

## 2024-03-08 ENCOUNTER — Telehealth: Payer: Self-pay | Admitting: Pharmacy Technician

## 2024-03-08 ENCOUNTER — Other Ambulatory Visit (HOSPITAL_COMMUNITY): Payer: Self-pay

## 2024-03-08 NOTE — Telephone Encounter (Signed)
 Pharmacy Patient Advocate Encounter  Received notification from [facility] that Prior Authorization for wegovy has been APPROVED from 03/08/24 to 03/08/25. Spoke to pharmacy to process.Copay is $4.00.    PA #/Case ID/Reference #: Case: [id number]

## 2024-03-08 NOTE — Telephone Encounter (Signed)
 Pharmacy Patient Advocate Encounter   Received notification from CoverMyMeds that prior authorization for wegovy is required/requested.   Insurance verification completed.   The patient is insured through [facility] .   Per test claim: PA required; PA submitted to above mentioned insurance via CoverMyMeds Key/confirmation #/EOC [id number]) Status is pending

## 2024-03-08 NOTE — Telephone Encounter (Signed)
 Pa submitted in new encounter

## 2024-03-09 NOTE — Telephone Encounter (Signed)
 Received notification from [facility] that Prior Authorization for Sergio Hicks has been APPROVED from 03/08/24 to 03/08/25. Spoke to pharmacy to process.Copay is $4.00.

## 2024-03-10 ENCOUNTER — Other Ambulatory Visit (HOSPITAL_COMMUNITY): Payer: Self-pay

## 2024-03-15 ENCOUNTER — Other Ambulatory Visit: Payer: Self-pay

## 2024-03-16 ENCOUNTER — Encounter (HOSPITAL_COMMUNITY): Payer: Self-pay | Admitting: Internal Medicine

## 2024-04-16 ENCOUNTER — Other Ambulatory Visit: Payer: Self-pay

## 2024-04-16 ENCOUNTER — Other Ambulatory Visit (HOSPITAL_COMMUNITY): Payer: Self-pay

## 2024-04-20 ENCOUNTER — Encounter (HOSPITAL_COMMUNITY): Payer: Self-pay | Admitting: *Deleted

## 2024-04-20 ENCOUNTER — Ambulatory Visit (HOSPITAL_COMMUNITY)
Admission: EM | Admit: 2024-04-20 | Discharge: 2024-04-20 | Disposition: A | Discharge status: Home / Self Care | Attending: Internal Medicine | Admitting: Internal Medicine

## 2024-04-20 ENCOUNTER — Other Ambulatory Visit (HOSPITAL_COMMUNITY): Payer: Self-pay

## 2024-04-20 DIAGNOSIS — L02415 Cutaneous abscess of right lower limb: Secondary | ICD-10-CM | POA: Diagnosis not present

## 2024-04-20 MED ORDER — DOXYCYCLINE HYCLATE 100 MG PO CAPS
100.0000 mg | ORAL_CAPSULE | Freq: Two times a day (BID) | ORAL | 0 refills | Status: DC
  Filled 2024-04-20: qty 20, 10d supply, fill #0

## 2024-04-20 NOTE — ED Triage Notes (Signed)
 Pt states he popped a zit on Sunday on his inner right thigh. He complains of increased redness and pain. He did put neosporin on it yesterday.

## 2024-04-20 NOTE — ED Provider Notes (Signed)
 [facility]    CSN: [id number] Arrival date & time: 04/20/24  1207      History   Chief Complaint Chief Complaint  Patient presents with   Wound Check    HPI Sergio Hicks is a 43 y.o. male.   Patient presents to urgent care for evaluation of swelling and redness to the right upper thigh that started 2 days ago.  He states the area started as a zit and he popped it while he was in the shower.  Some pus came out of the zit initially and he thought the area was going to heal but the area became more swollen and red/tender over the last few days.  He denies recent fever, chills, nausea, vomiting, and bodyaches.  No recent antibiotic or steroid use in the last 90 days.  Denies history of immunosuppression.  Taking over-the-counter medication for pain.     Past Medical History:  Diagnosis Date   Alcohol abuse    Asthma    Chronic systolic CHF (congestive heart failure) (HCC)    Coronary artery disease    a. OOH/late presenting MI 2019 s/p DES to prox/mid LAD.   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   Chronic systolic heart failure (HCC) 02/17/2019   CAD (coronary artery disease) 04/27/2018   Obesity 01/26/2018   ACS (acute coronary syndrome) (HCC) 01/18/2018   Heart attack (HCC)    Ischemic cardiomyopathy     Past Surgical History:  Procedure Laterality Date   CORONARY STENT INTERVENTION N/A 01/20/2018   Procedure: CORONARY STENT INTERVENTION;  Surgeon: [place], Peter M, MD;  Location: [facility] INVASIVE CV LAB;  Service: Cardiovascular;  Laterality: N/A;   LEFT HEART CATH AND CORONARY ANGIOGRAPHY N/A 01/18/2018   Procedure: LEFT HEART CATH AND CORONARY ANGIOGRAPHY;  Surgeon: Avanell Leigh, MD;  Location: [facility] INVASIVE CV LAB;   Service: Cardiovascular;  Laterality: N/A;   SUBQ ICD IMPLANT  02/17/2019   SUBQ ICD IMPLANT N/A 02/17/2019   Procedure: SUBQ ICD IMPLANT;  Surgeon: Tammie Fall, MD;  Location: [facility] INVASIVE CV LAB;  Service: Cardiovascular;  Laterality: N/A;       Home Medications    Prior to Admission medications   Medication Sig Start Date End Date Taking? Authorizing Provider  aspirin  EC 81 MG EC tablet Take 1 tablet (81 mg total) by mouth daily. 01/23/18  Yes Tylene Galla, PA-C  atorvastatin  (LIPITOR ) 80 MG tablet Take 1 tablet (80 mg total) by mouth daily. 06/24/23 06/23/24 Yes Bensimhon, Rheta Celestine, MD  carvedilol  (COREG ) 6.25 MG tablet Take 1.5 tablets (9.375 mg total) by mouth 2 (two) times daily. 12/18/23  Yes Bensimhon, Rheta Celestine, MD  dapagliflozin  propanediol (FARXIGA ) 10 MG TABS tablet Take 1 tablet by mouth daily before breakfast. 08/28/23  Yes Bensimhon, Rheta Celestine, MD  doxycycline  (VIBRAMYCIN ) 100 MG capsule Take 1 capsule (100 mg total) by mouth 2 (two) times daily. 04/20/24  Yes Starlene Eaton, FNP  furosemide  (LASIX ) 40 MG tablet Take 1 tablet (40 mg total) by mouth as needed. 04/17/21  Yes Bensimhon, Rheta Celestine, MD  loratadine  (CLARITIN ) 10 MG tablet Take 10 mg by mouth daily as needed for allergies.   Yes [provider]  Multiple Vitamin (MULTIVITAMIN)  tablet Take 1 tablet by mouth daily.   Yes [provider]  potassium chloride  SA (KLOR-CON ) 20 MEQ tablet Take 2 tablets (40 mEq total) by mouth daily as needed (when taking furosemide ). 04/17/21  Yes Bensimhon, Rheta Celestine, MD  sacubitril -valsartan  (ENTRESTO ) 49-51 MG Take 1 tablet by mouth 2 (two) times daily. 02/17/23  Yes   Semaglutide -Weight Management (WEGOVY ) 2.4 MG/0.75ML SOAJ Inject 2.4 mg into the skin once a week. 11/21/23  Yes Bensimhon, Rheta Celestine, MD  spironolactone  (ALDACTONE ) 25 MG tablet Take 1 tablet (25 mg total) by mouth daily. 09/16/23  Yes Bensimhon, Rheta Celestine, MD  albuterol  (PROVENTIL  HFA;VENTOLIN   HFA) 108 (90 Base) MCG/ACT inhaler Inhale 1-2 puffs into the lungs every 6 (six) hours as needed for wheezing or shortness of breath.    [provider]  amoxicillin  (AMOXIL ) 500 MG capsule Take 1 capsule (500 mg total) by mouth 3 (three) times daily until gone 07/28/23     chlorhexidine  (PERIDEX ) 0.12 % solution RINSE MOUTH WITH (1 CAPFUL) FOR 30 SECONDS AM AND PM AFTER TOOTHBRUSHING. EXPECTORATE AFTER RINSING, DO NOT SWALLOW 03/03/24     Evolocumab  (REPATHA  SURECLICK) 140 MG/ML SOAJ Inject 140 mg into the skin every 14 (fourteen) days. 08/07/23   Bensimhon, Rheta Celestine, MD  HYDROcodone -acetaminophen  (NORCO/VICODIN) 5-325 MG tablet Take 1 tablet by mouth every 6 (six) hours as needed. 03/03/24     nitroGLYCERIN  (NITROSTAT ) 0.4 MG SL tablet Place 1 tablet (0.4 mg total) under the tongue every 5 (five) minutes as needed for chest pain. 07/31/23   Bensimhon, Rheta Celestine, MD    Family History Family History  Adopted: Yes    Social History Social History   Tobacco Use   Smoking status: Former    Types: Cigarettes   Smokeless tobacco: Never   Tobacco comments:    2019  Vaping Use   Vaping status: Never Used  Substance Use Topics   Alcohol use: Not Currently    Alcohol/week: 1.0 standard drink of alcohol    Types: 1 Glasses of wine per week    Comment: quit in 2021   Drug use: Never     Allergies   Patient has no known allergies.   Review of Systems Review of Systems Per HPI Physical Exam Triage Vital Signs ED Triage Vitals  Encounter Vitals Group     BP 04/20/24 1311 106/72     Systolic BP Percentile --      Diastolic BP Percentile --      Pulse Rate 04/20/24 1311 64     Resp 04/20/24 1311 18     Temp 04/20/24 1311 98.2 F (36.8 C)     Temp Source 04/20/24 1311 Oral     SpO2 04/20/24 1311 96 %     Weight --      Height --      Head Circumference --      Peak Flow --      Pain Score 04/20/24 1309 7     Pain Loc --      Pain Education --      Exclude from Growth  Chart --    No data found.  Updated Vital Signs BP 106/72 (BP Location: Left Arm)   Pulse 64   Temp 98.2 F (36.8 C) (Oral)   Resp 18   SpO2 96%   Visual Acuity Right Eye Distance:   Left Eye Distance:   Bilateral Distance:    Right Eye Near:   Left Eye Near:  Bilateral Near:     Physical Exam Vitals and nursing note reviewed.  Constitutional:      Appearance: He is not ill-appearing or toxic-appearing.  HENT:     Head: Normocephalic and atraumatic.     Right Ear: Hearing and external ear normal.     Left Ear: Hearing and external ear normal.     Nose: Nose normal.     Mouth/Throat:     Lips: Pink.  Eyes:     General: Lids are normal. Vision grossly intact. Gaze aligned appropriately.     Extraocular Movements: Extraocular movements intact.     Conjunctiva/sclera: Conjunctivae normal.  Pulmonary:     Effort: Pulmonary effort is normal.  Musculoskeletal:     Cervical back: Neck supple.  Skin:    General: Skin is warm and dry.     Capillary Refill: Capillary refill takes less than 2 seconds.     Findings: Abscess present. No rash.          Comments: Cellulitis of the right upper thigh measures 11 cm x 12 cm, outlined prior to discharge. Indurated abscess measures 3 cm x 5 cm. Abscess is tender to palpation.  No active drainage.  Warmth to surrounding skin present.  Neurological:     General: No focal deficit present.     Mental Status: He is alert and oriented to person, place, and time. Mental status is at baseline.     Cranial Nerves: No dysarthria or facial asymmetry.  Psychiatric:        Mood and Affect: Mood normal.        Speech: Speech normal.        Behavior: Behavior normal.        Thought Content: Thought content normal.        Judgment: Judgment normal.      UC Treatments / Results  Labs (all labs ordered are listed, but only abnormal results are displayed) Labs Reviewed - No data to display  EKG   Radiology No results  found.  Procedures Procedures (including critical care time)  Medications Ordered in UC Medications - No data to display  Initial Impression / Assessment and Plan / UC Course  I have reviewed the triage vital signs and the nursing notes.  Pertinent labs & imaging results that were available during my care of the patient were reviewed by me and considered in my medical decision making (see chart for details).   1.  Abscess of the right leg Abscess appears infected, though is immature and is not ready for drainage.  Doxycycline  antibiotic ordered, discussed supportive care and wound care as outlined in AVS. Return for I&D if abscess fails to drain on its own in the next 2-3 days with use of antibiotic and supportive care.   Counseled patient on potential for adverse effects with medications prescribed/recommended today, strict ER and return-to-clinic precautions discussed, patient verbalized understanding.    Final Clinical Impressions(s) / UC Diagnoses   Final diagnoses:  Abscess of right leg     Discharge Instructions      Your abscess has been evaluated in the clinic and appears to be infected, but is not quite ready for drainage.   I would like for you to perform warm compresses to the area frequently and continue taking over the counter medications for pain and inflammation as directed.   Start taking antibiotic sent to pharmacy as directed. This will help reduce infection to area and help the abscess mature/drain.  If abscess does not begin to drain on its own over the next few days and becomes softer, please return to urgent care for this to be drained appropriately.   If you have any fever, chills, nausea, vomiting, or worsening pain/swelling to the area, please return to urgent care for reevaluation.     ED Prescriptions     Medication Sig Dispense Auth. Provider   doxycycline  (VIBRAMYCIN ) 100 MG capsule Take 1 capsule (100 mg total) by mouth 2 (two) times daily.  20 capsule Starlene Eaton, FNP      PDMP not reviewed this encounter.   Starlene Eaton, [place] 04/20/24 [id number]

## 2024-04-20 NOTE — Discharge Instructions (Signed)

## 2024-04-27 ENCOUNTER — Ambulatory Visit (INDEPENDENT_AMBULATORY_CARE_PROVIDER_SITE_OTHER)

## 2024-04-27 ENCOUNTER — Other Ambulatory Visit: Payer: Self-pay

## 2024-04-27 ENCOUNTER — Other Ambulatory Visit (HOSPITAL_COMMUNITY): Payer: Self-pay

## 2024-04-27 ENCOUNTER — Other Ambulatory Visit (HOSPITAL_COMMUNITY): Payer: Self-pay | Admitting: Internal Medicine

## 2024-04-27 DIAGNOSIS — I255 Ischemic cardiomyopathy: Secondary | ICD-10-CM | POA: Diagnosis not present

## 2024-04-27 MED ORDER — CARVEDILOL 6.25 MG PO TABS
9.3750 mg | ORAL_TABLET | Freq: Two times a day (BID) | ORAL | 2 refills | Status: DC
  Filled 2024-04-27: qty 60, 20d supply, fill #0

## 2024-04-28 ENCOUNTER — Encounter: Payer: Self-pay | Admitting: Internal Medicine

## 2024-04-28 LAB — CUP PACEART REMOTE DEVICE CHECK
Battery Remaining Percentage: 42 %
Date Time Interrogation Session: 20250429175700
HighPow Impedance: 80 Ohm
Implantable Lead Connection Status: 753985
Implantable Lead Implant Date: 20200219
Implantable Lead Location: 753862
Implantable Lead Model: 3501
Implantable Lead Serial Number: 168743
Implantable Pulse Generator Implant Date: 20200219
Pulse Gen Serial Number: 257847

## 2024-04-30 ENCOUNTER — Other Ambulatory Visit (HOSPITAL_COMMUNITY): Payer: Self-pay

## 2024-05-04 ENCOUNTER — Other Ambulatory Visit (HOSPITAL_COMMUNITY): Payer: Self-pay

## 2024-05-04 ENCOUNTER — Encounter (HOSPITAL_COMMUNITY): Payer: Self-pay | Admitting: Internal Medicine

## 2024-05-04 ENCOUNTER — Ambulatory Visit (HOSPITAL_COMMUNITY)
Admission: RE | Admit: 2024-05-04 | Discharge: 2024-05-04 | Disposition: A | Discharge status: Home / Self Care | Source: Ambulatory Visit | Attending: Internal Medicine | Admitting: Internal Medicine

## 2024-05-04 VITALS — BP 100/70 | HR 54 | Wt 259.2 lb

## 2024-05-04 DIAGNOSIS — G4733 Obstructive sleep apnea (adult) (pediatric): Secondary | ICD-10-CM | POA: Diagnosis not present

## 2024-05-04 DIAGNOSIS — I5022 Chronic systolic (congestive) heart failure: Secondary | ICD-10-CM | POA: Diagnosis not present

## 2024-05-04 DIAGNOSIS — Z7902 Long term (current) use of antithrombotics/antiplatelets: Secondary | ICD-10-CM | POA: Insufficient documentation

## 2024-05-04 DIAGNOSIS — I2510 Atherosclerotic heart disease of native coronary artery without angina pectoris: Secondary | ICD-10-CM | POA: Insufficient documentation

## 2024-05-04 DIAGNOSIS — Z6832 Body mass index (BMI) 32.0-32.9, adult: Secondary | ICD-10-CM | POA: Diagnosis not present

## 2024-05-04 DIAGNOSIS — E669 Obesity, unspecified: Secondary | ICD-10-CM | POA: Insufficient documentation

## 2024-05-04 DIAGNOSIS — I252 Old myocardial infarction: Secondary | ICD-10-CM | POA: Diagnosis not present

## 2024-05-04 DIAGNOSIS — Z79899 Other long term (current) drug therapy: Secondary | ICD-10-CM | POA: Diagnosis not present

## 2024-05-04 DIAGNOSIS — Z955 Presence of coronary angioplasty implant and graft: Secondary | ICD-10-CM | POA: Diagnosis not present

## 2024-05-04 DIAGNOSIS — Z87891 Personal history of nicotine dependence: Secondary | ICD-10-CM | POA: Diagnosis not present

## 2024-05-04 DIAGNOSIS — Z95810 Presence of automatic (implantable) cardiac defibrillator: Secondary | ICD-10-CM | POA: Insufficient documentation

## 2024-05-04 DIAGNOSIS — Z7982 Long term (current) use of aspirin: Secondary | ICD-10-CM | POA: Insufficient documentation

## 2024-05-04 DIAGNOSIS — I255 Ischemic cardiomyopathy: Secondary | ICD-10-CM | POA: Diagnosis not present

## 2024-05-04 DIAGNOSIS — Z7984 Long term (current) use of oral hypoglycemic drugs: Secondary | ICD-10-CM | POA: Insufficient documentation

## 2024-05-04 LAB — CBC
HCT: 41.6 % (ref 39.0–52.0)
Hemoglobin: 14.2 g/dL (ref 13.0–17.0)
MCH: 29.9 pg (ref 26.0–34.0)
MCHC: 34.1 g/dL (ref 30.0–36.0)
MCV: 87.6 fL (ref 80.0–100.0)
Platelets: 270 10*3/uL (ref 150–400)
RBC: 4.75 MIL/uL (ref 4.22–5.81)
RDW: 13.1 % (ref 11.5–15.5)
WBC: 5.6 10*3/uL (ref 4.0–10.5)
nRBC: 0 % (ref 0.0–0.2)

## 2024-05-04 LAB — COMPREHENSIVE METABOLIC PANEL WITH GFR
ALT: 16 U/L (ref 0–44)
AST: 15 U/L (ref 15–41)
Albumin: 3.5 g/dL (ref 3.5–5.0)
Alkaline Phosphatase: 39 U/L (ref 38–126)
Anion gap: 6 (ref 5–15)
BUN: 8 mg/dL (ref 6–20)
CO2: 24 mmol/L (ref 22–32)
Calcium: 8.9 mg/dL (ref 8.9–10.3)
Chloride: 108 mmol/L (ref 98–111)
Creatinine, Ser: 0.95 mg/dL (ref 0.61–1.24)
GFR, Estimated: >60 mL/min (ref >60)
Glucose, Bld: 90 mg/dL (ref 70–99)
Potassium: 4 mmol/L (ref 3.5–5.1)
Sodium: 138 mmol/L (ref 135–145)
Total Bilirubin: 1 mg/dL (ref 0.0–1.2)
Total Protein: 6.6 g/dL (ref 6.5–8.1)

## 2024-05-04 LAB — BRAIN NATRIURETIC PEPTIDE: B Natriuretic Peptide: 104.7 pg/mL — ABNORMAL HIGH (ref 0.0–100.0)

## 2024-05-04 MED ORDER — DAPAGLIFLOZIN PROPANEDIOL 10 MG PO TABS
10.0000 mg | ORAL_TABLET | Freq: Every day | ORAL | 11 refills | Status: AC
  Filled 2024-05-04: qty 30, 30d supply, fill #0
  Filled 2024-06-17 – 2024-06-30 (×2): qty 30, 30d supply, fill #1
  Filled 2024-08-12: qty 30, 30d supply, fill #2
  Filled 2024-11-02: qty 30, 30d supply, fill #3
  Filled 2024-12-25 – 2025-01-07 (×2): qty 30, 30d supply, fill #4

## 2024-05-04 MED ORDER — ATORVASTATIN CALCIUM 80 MG PO TABS
80.0000 mg | ORAL_TABLET | Freq: Every day | ORAL | 11 refills | Status: AC
  Filled 2024-05-12 – 2024-06-30 (×3): qty 30, 30d supply, fill #0
  Filled 2024-08-12: qty 30, 30d supply, fill #1
  Filled 2024-11-02: qty 30, 30d supply, fill #2
  Filled 2024-12-25 – 2025-01-07 (×2): qty 30, 30d supply, fill #3

## 2024-05-04 MED ORDER — SPIRONOLACTONE 25 MG PO TABS
25.0000 mg | ORAL_TABLET | Freq: Every day | ORAL | 11 refills | Status: AC
  Filled 2024-05-12 – 2024-05-25 (×2): qty 30, 30d supply, fill #0
  Filled 2024-06-30: qty 30, 30d supply, fill #1
  Filled 2024-08-12: qty 30, 30d supply, fill #2
  Filled 2024-11-02: qty 30, 30d supply, fill #3
  Filled 2024-12-25 – 2025-01-07 (×2): qty 30, 30d supply, fill #4

## 2024-05-04 MED ORDER — CARVEDILOL 6.25 MG PO TABS
9.3750 mg | ORAL_TABLET | Freq: Two times a day (BID) | ORAL | 11 refills | Status: AC
  Filled 2024-05-12 – 2024-05-25 (×2): qty 60, 20d supply, fill #0
  Filled 2024-06-17 – 2024-06-30 (×2): qty 60, 20d supply, fill #1
  Filled 2024-08-12: qty 60, 20d supply, fill #2
  Filled 2024-11-02: qty 60, 20d supply, fill #3
  Filled 2024-12-25 – 2025-01-07 (×2): qty 60, 20d supply, fill #4

## 2024-05-04 MED ORDER — ENTRESTO 49-51 MG PO TABS
1.0000 | ORAL_TABLET | Freq: Two times a day (BID) | ORAL | 11 refills | Status: AC
  Filled 2024-05-04: qty 60, 30d supply, fill #0
  Filled 2024-06-17 – 2024-06-30 (×2): qty 60, 30d supply, fill #1
  Filled 2024-08-12: qty 60, 30d supply, fill #2
  Filled 2024-11-02: qty 60, 30d supply, fill #3
  Filled 2024-12-25 – 2025-01-07 (×2): qty 60, 30d supply, fill #4

## 2024-05-04 MED ORDER — FUROSEMIDE 40 MG PO TABS
40.0000 mg | ORAL_TABLET | ORAL | 11 refills | Status: AC | PRN
  Filled 2024-05-04: qty 30, 30d supply, fill #0

## 2024-05-04 MED ORDER — POTASSIUM CHLORIDE CRYS ER 20 MEQ PO TBCR
40.0000 meq | EXTENDED_RELEASE_TABLET | Freq: Every day | ORAL | 11 refills | Status: AC | PRN
  Filled 2024-05-04: qty 30, 15d supply, fill #0

## 2024-05-04 NOTE — Progress Notes (Signed)
 Advanced Heart Failure Clinic Note  PCP: [facility]) EP: Dr. Carolynne Citron HF Cardiologist: Dr. Julane Ny  HPI: Sergio Hicks is a 43 y.o. 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   Admitted 01/18/2018 with OOH anterior MI and severe ICM. Taken urgently for cath which showed 100% LAD occlusion as below. ECHO 01/18/18 LVEF 30%. Initially treated medically but developed persistent CP so taken for PCI and pt is s/p DES to proximal/mid LAD 01/20/18.  S/p SQ [employer] ICD with Dr. Carolynne Citron on 02/17/19.  Echo 4/22 EF 30-35%, moderately decreased LV, grade II DD, RV ok   Echo 6/24 EF 30-35%, HK apex, RV ok  Today he returns for HF follow up. We haven't seen him in almost a year. Feeling pretty good. Compliant with meds. Goes to gym and can run for 5 mins on TM, lifts and swims laps for 20 mins. No CP, edema, orthopnea or PND. Takes Wegovy  1x/month. Lost 50 pounds. Also changed diet and does intermittent fasting. Occasionally lightheaded with standing  Cardiac Studies:  Echo (6/24): EF 30-35%, RV ok Echo (4/22): EF 30-35%, G2DD, Echo (1/21): EF 35-40% RV normal (Dr. Julane Ny felt 30-35%). Echo (8/19): EF 25-30% Echo (4/19): EF 30-35%.  Echo (1/19): EF 30-35%   - LHC (1/19): DES to prox/mid LAD  Review of systems complete and found to be negative unless listed in HPI.   SH:  Social History   Socioeconomic History   Marital status: Single    Spouse name: Not on file   Number of children: Not on file   Years of education: Not on file   Highest education level: Not on file  Occupational History   Occupation: chef  Tobacco Use   Smoking status: Former    Types: Cigarettes   Smokeless tobacco: Never   Tobacco comments:    2019  Vaping Use   Vaping status: Never Used  Substance and Sexual Activity   Alcohol use: Not Currently     Alcohol/week: 1.0 standard drink of alcohol    Types: 1 Glasses of wine per week    Comment: quit in 2021   Drug use: Never   Sexual activity: Yes  Other Topics Concern   Not on file  Social History Narrative   Not on file   Social Drivers of Health   Financial Resource Strain: Not on file  Food Insecurity: Food Insecurity Present (06/25/2023)   Hunger Vital Sign    Worried About Running Out of Food in the Last Year: Sometimes true    Ran Out of Food in the Last Year: Never true  Transportation Needs: Not on file  Physical Activity: Not on file  Stress: Not on file  Social Connections: Not on file  Intimate Partner Violence: Not on file   FH:  Family History  Adopted: Yes   Past Medical History:  Diagnosis Date   Alcohol abuse    Asthma    Chronic systolic CHF (congestive heart failure) (HCC)    Coronary artery disease    a. OOH/late presenting MI 2019 s/p DES to prox/mid LAD.   Ischemic cardiomyopathy    Mild sleep apnea    Myocardial infarction [facility])    Tobacco abuse    Current Outpatient Medications  Medication Sig Dispense Refill   albuterol  (PROVENTIL  HFA;VENTOLIN  HFA) 108 (90 Base) MCG/ACT inhaler Inhale 1-2 puffs into the  lungs every 6 (six) hours as needed for wheezing or shortness of breath.     aspirin  EC 81 MG EC tablet Take 1 tablet (81 mg total) by mouth daily. 30 tablet 6   atorvastatin  (LIPITOR ) 80 MG tablet Take 1 tablet (80 mg total) by mouth daily. 30 tablet 11   carvedilol  (COREG ) 6.25 MG tablet Take 1.5 tablets (9.375 mg total) by mouth 2 (two) times daily. 60 tablet 2   dapagliflozin  propanediol (FARXIGA ) 10 MG TABS tablet Take 1 tablet by mouth daily before breakfast. 90 tablet 3   furosemide  (LASIX ) 40 MG tablet Take 1 tablet (40 mg total) by mouth as needed. 30 tablet 3   loratadine  (CLARITIN ) 10 MG tablet Take 10 mg by mouth daily as needed for allergies.     Multiple Vitamin (MULTIVITAMIN) tablet Take 1 tablet by mouth daily.     nitroGLYCERIN   (NITROSTAT ) 0.4 MG SL tablet Place 1 tablet (0.4 mg total) under the tongue every 5 (five) minutes as needed for chest pain. 60 tablet 0   potassium chloride  SA (KLOR-CON ) 20 MEQ tablet Take 2 tablets (40 mEq total) by mouth daily as needed (when taking furosemide ). 30 tablet 3   sacubitril -valsartan  (ENTRESTO ) 49-51 MG Take 1 tablet by mouth 2 (two) times daily. 60 tablet 2   Semaglutide -Weight Management (WEGOVY ) 2.4 MG/0.75ML SOAJ Inject 2.4 mg into the skin once a week. 3 mL 11   spironolactone  (ALDACTONE ) 25 MG tablet Take 1 tablet (25 mg total) by mouth daily. 30 tablet 11   Evolocumab  (REPATHA  SURECLICK) 140 MG/ML SOAJ Inject 140 mg into the skin every 14 (fourteen) days. (Patient not taking: Reported on 05/04/2024) 6 mL 3   No current facility-administered medications for this encounter.   BP 100/70   Pulse (!) 54   Wt 117.6 kg (259 lb 3.2 oz)   SpO2 97%   BMI 32.40 kg/m   Wt Readings from Last 3 Encounters:  05/04/24 117.6 kg (259 lb 3.2 oz)  06/25/23 (!) 139.6 kg (307 lb 12.8 oz)  11/28/22 (!) 146 kg (321 lb 12.8 oz)    PHYSICAL EXAM: General:  Well appearing. No resp difficulty HEENT: normal Neck: supple. no JVD. Carotids 2+ bilat; no bruits. No lymphadenopathy or thryomegaly appreciated. Cor: PMI nondisplaced. Regular rate & rhythm. No rubs, gallops or murmurs. Lungs: clear Abdomen: soft, nontender, nondistended. No hepatosplenomegaly. No bruits or masses. Good bowel sounds. Extremities: no cyanosis, clubbing, rash, edema Neuro: alert & orientedx3, cranial nerves grossly intact. moves all 4 extremities w/o difficulty. Affect pleasant   Device interrogation (personally reviewed from 03/3024): no AF/VT Personally reviewed  ECG (personally reviewed): Sinus brady 55 anterior Qs Personally reviewed   ASSESSMENT & PLAN: 1. Chronic Systolic Heart Failure due to ICM - Echo (1/19): EF 30-35%.  - Echo (4/19): EF 30-35%.  - Echo (8/19): EF 25-30%  - Echo (1/21): EF 35-40% RV  ok. (I thought 30-35%) - S/p SQ [employer] ICD 12/18/19. - Echo (6/22): EF 30-35% - Echo (6/24): EF 30-35%, RV ok - Much improved NYHA I  - Volume status ok on PRN lasix  - Continue Entresto  49/51 mg bid. Discussed fact that BP may drop further as he loses weight. May need to cut back to 24/26 bid  - Continue carvedilol   9.375 mg bid. - Continue spironolactone  25 mg daily. - Continue Farxiga  10 mg daily. - Can consider Barostim as symptoms dictate - Due for repeat labs and echo. Will order   2. CAD - Cath  1/20 with total occlusion of LAD with R to L collaterals.  - s/p DES to proximal/mid LAD (1/20). - No s/s angina - Continue high dose statin, repatha , aspirin , and plavix .   3. Obesity - Body mass index is 32.4 kg/m. - Has lost 50 pounds with Wegovy  and diet/exercise  4. OSA - Continue CPAP - Follows with Dr. Bland Bunnell, MD 11:21 AM

## 2024-05-04 NOTE — Patient Instructions (Signed)
 Great to see you today!!!  Medication Changes:  No changes, continue current medications, refills have been sent in  Lab Work:  Labs done today, your results will be available in MyChart, we will contact you for abnormal readings.   Testing/Procedures:  Your physician has requested that you have an echocardiogram. Echocardiography is a painless test that uses sound waves to create images of your heart. It provides your doctor with information about the size and shape of your heart and how well your heart's chambers and valves are working. This procedure takes approximately one hour. There are no restrictions for this procedure. Please do NOT wear cologne, perfume, aftershave, or lotions (deodorant is allowed). Please arrive 15 minutes prior to your appointment time.  Please note: We ask at that you not bring children with you during ultrasound (echo/ vascular) testing. Due to room size and safety concerns, children are not allowed in the ultrasound rooms during exams. Our front office staff cannot provide observation of children in our lobby area while testing is being conducted. An adult accompanying a patient to their appointment will only be allowed in the ultrasound room at the discretion of the ultrasound technician under special circumstances. We apologize for any inconvenience.  Special Instructions // Education:  Do the following things EVERYDAY: Weigh yourself in the morning before breakfast. Write it down and keep it in a log. Take your medicines as prescribed Eat low salt foods--Limit salt (sodium) to 2000 mg per day.  Stay as active as you can everyday Limit all fluids for the day to less than 2 liters   Follow-Up in: 9 months   At the [facility], you and your health needs are our priority. We have a designated team specialized in the treatment of Heart Failure. This Care Team includes your primary Heart Failure Specialized Cardiologist (physician),  Advanced Practice Providers (APPs- Physician Assistants and Nurse Practitioners), and Pharmacist who all work together to provide you with the care you need, when you need it.   You may see any of the following providers on your designated Care Team at your next follow up:  Dr. Jules Oar Dr. Peder Bourdon Dr. Alwin Baars Dr. Judyth Nunnery Nieves Bars, NP Ruddy Corral, [place] [facility] [place], [place] Dennise Fitz, NP [place] Lee, NP Luster Salters, PharmD   Please be sure to bring in all your medications bottles to every appointment.   Need to Contact Us :  If you have any questions or concerns before your next appointment please send us  a message through [employer] or call our office at [phone].    TO LEAVE A MESSAGE FOR THE NURSE SELECT OPTION 2, PLEASE LEAVE A MESSAGE INCLUDING: YOUR NAME DATE OF BIRTH CALL BACK NUMBER REASON FOR CALL**this is important as we prioritize the call backs  YOU WILL RECEIVE A CALL BACK THE SAME DAY AS LONG AS YOU CALL BEFORE 4:00 PM

## 2024-05-04 NOTE — Addendum Note (Signed)
 Encounter addended by: Glorietta Lark, RN on: 05/04/2024 11:41 AM  Actions taken: Pharmacy for encounter modified, Order list changed, Diagnosis association updated, Clinical Note Signed, Charge Capture section accepted

## 2024-05-12 ENCOUNTER — Other Ambulatory Visit (HOSPITAL_COMMUNITY): Payer: Self-pay

## 2024-05-12 ENCOUNTER — Other Ambulatory Visit: Payer: Self-pay

## 2024-05-21 ENCOUNTER — Other Ambulatory Visit (HOSPITAL_COMMUNITY): Payer: Self-pay

## 2024-05-25 ENCOUNTER — Other Ambulatory Visit: Payer: Self-pay

## 2024-05-25 ENCOUNTER — Other Ambulatory Visit (HOSPITAL_COMMUNITY): Payer: Self-pay

## 2024-06-11 NOTE — Addendum Note (Signed)
 Addended by: Lott Rouleau A on: 06/11/2024 09:51 AM   Modules accepted: Orders

## 2024-06-11 NOTE — Progress Notes (Signed)
 Remote ICD transmission.

## 2024-06-17 ENCOUNTER — Encounter: Payer: Self-pay | Admitting: Pharmacist

## 2024-06-17 ENCOUNTER — Other Ambulatory Visit: Payer: Self-pay

## 2024-06-21 ENCOUNTER — Other Ambulatory Visit: Payer: Self-pay

## 2024-06-29 ENCOUNTER — Ambulatory Visit (HOSPITAL_COMMUNITY)
Admission: RE | Admit: 2024-06-29 | Discharge: 2024-06-29 | Disposition: A | Discharge status: Home / Self Care | Source: Ambulatory Visit | Attending: Internal Medicine | Admitting: Internal Medicine

## 2024-06-29 DIAGNOSIS — I2510 Atherosclerotic heart disease of native coronary artery without angina pectoris: Secondary | ICD-10-CM | POA: Diagnosis not present

## 2024-06-29 DIAGNOSIS — I5022 Chronic systolic (congestive) heart failure: Secondary | ICD-10-CM | POA: Insufficient documentation

## 2024-06-29 DIAGNOSIS — E785 Hyperlipidemia, unspecified: Secondary | ICD-10-CM | POA: Insufficient documentation

## 2024-06-29 DIAGNOSIS — Z006 Encounter for examination for normal comparison and control in clinical research program: Secondary | ICD-10-CM

## 2024-06-29 LAB — ECHOCARDIOGRAM COMPLETE
Area-P 1/2: 3.91 cm2
S' Lateral: 4.8 cm
Single Plane A4C EF: 43.5 %

## 2024-06-29 NOTE — Progress Notes (Signed)
  Echocardiogram 2D Echocardiogram has been performed.  Koleen KANDICE Popper, RDCS 06/29/2024, 11:38 AM

## 2024-06-29 NOTE — Research (Signed)
 SITE: [id number]     Subject # [id number]   Subprotocol: A  Inclusion Criteria  Patients who meet all of the following criteria are eligible for enrollment as study participants:  Yes No  Age > 43 years old X   Eligible to wear Holter Study X    Exclusion Criteria  Patients who meet any of these criteria are not eligible for enrollment as study participants: Yes No  1. Receiving any mechanical (respiratory or circulatory) or renal support therapy at Screening or during Visit #1.  X  2.  Any other conditions that in the opinion of the investigators are likely to prevent compliance with the study protocol or pose a safety concern if the subject participates in the study.  X  3. Poor tolerance, namely susceptible to severe skin allergies from ECG adhesive patch application.  X   Protocol: REV H    60 minute start window         Cor device must be applied, and the study initiated, no later than 60 minutes of completing the Echocardiogram                             HH:MM  Echo completion time  11:29  2.   Cor Study start time  11:32   30-Minute execution window  Once Cor Monitoring begins, 3 QT Med ECGs and the 15-minute rest period must be completed within a 30 minute window     HH:MM  3. QT Med ECG Completion time  11:59  4. Start of 15-Min sitting rest period  11:40  5. End of 15-Min rest period  11:55  6. Time of device removal  12:05   *Continue to use the Mobile App Event feature to log the Rest period windows and follow instructions on the EF-ACT Clinical Trial  Patient Instruction Card.  Describe any anomalies in Protocol execution in the Protocol Deviation Log    Residential Zip [phone] (First 3 digits ONLY)                                           [facility] Informed Consent   Subject Name: Sergio Hicks  Subject met inclusion and exclusion criteria.  The informed consent form, study requirements and expectations were reviewed with the subject. Subject had opportunity to read  consent and questions and concerns were addressed prior to the signing of the consent form.  The subject verbalized understanding of the trial requirements.  The subject agreed to participate in the [facility] EF ACT trial and signed the informed consent at 11:30 on 29-Jun-2024.  The informed consent was obtained prior to performance of any protocol-specific procedures for the subject.  A copy of the signed informed consent was given to the subject and a copy was placed in the subject's medical record.   Dorthea LITTIE Louder          Current Outpatient Medications:    albuterol  (PROVENTIL  HFA;VENTOLIN  HFA) 108 (90 Base) MCG/ACT inhaler, Inhale 1-2 puffs into the lungs every 6 (six) hours as needed for wheezing or shortness of breath., Disp: , Rfl:    aspirin  EC 81 MG EC tablet, Take 1 tablet (81 mg total) by mouth daily., Disp: 30 tablet, Rfl: 6   atorvastatin  (LIPITOR ) 80 MG tablet, Take 1 tablet (80 mg total) by mouth daily., Disp:  30 tablet, Rfl: 11   carvedilol  (COREG ) 6.25 MG tablet, Take 1.5 tablets (9.375 mg total) by mouth 2 (two) times daily., Disp: 60 tablet, Rfl: 11   dapagliflozin  propanediol (FARXIGA ) 10 MG TABS tablet, Take 1 tablet by mouth daily before breakfast., Disp: 30 tablet, Rfl: 11   Evolocumab  (REPATHA  SURECLICK) 140 MG/ML SOAJ, Inject 140 mg into the skin every 14 (fourteen) days. (Patient not taking: Reported on 05/04/2024), Disp: 6 mL, Rfl: 3   furosemide  (LASIX ) 40 MG tablet, Take 1 tablet (40 mg total) by mouth as needed., Disp: 30 tablet, Rfl: 11   loratadine  (CLARITIN ) 10 MG tablet, Take 10 mg by mouth daily as needed for allergies., Disp: , Rfl:    Multiple Vitamin (MULTIVITAMIN) tablet, Take 1 tablet by mouth daily., Disp: , Rfl:    nitroGLYCERIN  (NITROSTAT ) 0.4 MG SL tablet, Place 1 tablet (0.4 mg total) under the tongue every 5 (five) minutes as needed for chest pain., Disp: 60 tablet, Rfl: 0   potassium chloride  SA (KLOR-CON  M) 20 MEQ tablet, Take 2 tablets (40 mEq total)  by mouth daily as needed (when taking furosemide )., Disp: 30 tablet, Rfl: 11   sacubitril -valsartan  (ENTRESTO ) 49-51 MG, Take 1 tablet by mouth 2 (two) times daily., Disp: 60 tablet, Rfl: 11   Semaglutide -Weight Management (WEGOVY ) 2.4 MG/0.75ML SOAJ, Inject 2.4 mg into the skin once a week., Disp: 3 mL, Rfl: 11   spironolactone  (ALDACTONE ) 25 MG tablet, Take 1 tablet (25 mg total) by mouth daily., Disp: 30 tablet, Rfl: 11

## 2024-06-30 ENCOUNTER — Other Ambulatory Visit (HOSPITAL_COMMUNITY): Payer: Self-pay

## 2024-07-01 ENCOUNTER — Other Ambulatory Visit: Payer: Self-pay

## 2024-07-27 ENCOUNTER — Encounter

## 2024-08-12 ENCOUNTER — Other Ambulatory Visit: Payer: Self-pay

## 2024-10-26 ENCOUNTER — Encounter

## 2024-11-05 ENCOUNTER — Other Ambulatory Visit (HOSPITAL_COMMUNITY): Payer: Self-pay

## 2024-12-27 ENCOUNTER — Other Ambulatory Visit: Payer: Self-pay

## 2024-12-29 ENCOUNTER — Other Ambulatory Visit: Payer: Self-pay

## 2024-12-29 ENCOUNTER — Encounter: Payer: Self-pay | Admitting: Pharmacist

## 2025-01-04 ENCOUNTER — Other Ambulatory Visit: Payer: Self-pay

## 2025-01-09 ENCOUNTER — Other Ambulatory Visit: Payer: Self-pay

## 2025-01-25 ENCOUNTER — Encounter

## 2025-01-27 ENCOUNTER — Telehealth: Payer: Self-pay | Admitting: Cardiovascular Disease

## 2025-01-27 NOTE — Telephone Encounter (Signed)
 Called re missed remote

## 2025-02-02 NOTE — Progress Notes (Incomplete)
 "  Advanced Heart Failure Clinic Note  PCP: [facility]) EP: Dr. Waddell HF Cardiologist: Dr. Cherrie  HPI: Sergio Hicks is a 44 y.o. 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   Admitted 01/18/2018 with OOH anterior MI and severe ICM. Taken urgently for cath which showed 100% LAD occlusion as below. ECHO 01/18/18 LVEF 30%. Initially treated medically but developed persistent CP so taken for PCI and pt is s/p DES to proximal/mid LAD 01/20/18.  S/p SQ [employer] ICD with Dr. Waddell on 02/17/19.  Echo 4/22 EF 30-35%, moderately decreased LV, grade II DD, RV ok   Echo 6/24 EF 30-35%, HK apex, RV ok  Today he returns for HF follow up. We haven't seen him in almost a year. Feeling pretty good. Compliant with meds. Goes to gym and can run for 5 mins on TM, lifts and swims laps for 20 mins. No CP, edema, orthopnea or PND. Takes Wegovy  1x/month. Lost 50 pounds. Also changed diet and does intermittent fasting. Occasionally lightheaded with standing  Cardiac Studies:  Echo (6/24): EF 30-35%, RV ok Echo (4/22): EF 30-35%, G2DD, Echo (1/21): EF 35-40% RV normal (Dr. Cherrie felt 30-35%). Echo (8/19): EF 25-30% Echo (4/19): EF 30-35%.  Echo (1/19): EF 30-35%   - LHC (1/19): DES to prox/mid LAD  Review of systems complete and found to be negative unless listed in HPI.   SH:  Social History   Socioeconomic History   Marital status: Single    Spouse name: Not on file   Number of children: Not on file   Years of education: Not on file   Highest education level: Not on file  Occupational History   Occupation: chef  Tobacco Use   Smoking status: Former    Types: Cigarettes   Smokeless tobacco: Never   Tobacco comments:    2019  Vaping Use   Vaping status: Never Used  Substance and Sexual Activity   Alcohol use: Not Currently     Alcohol/week: 1.0 standard drink of alcohol    Types: 1 Glasses of wine per week    Comment: quit in 2021   Drug use: Never   Sexual activity: Yes  Other Topics Concern   Not on file  Social History Narrative   Not on file   Social Drivers of Health   Tobacco Use: Low Risk (11/23/2024)   Received from [facility]   Patient History    Smoking Tobacco Use: Never    Smokeless Tobacco Use: Never    Passive Exposure: Not on file  Financial Resource Strain: Not on file  Food Insecurity: Food Insecurity Present (06/25/2023)   Hunger Vital Sign    Worried About Programme Researcher, Broadcasting/film/video in the Last Year: Sometimes true    Ran Out of Food in the Last Year: Never true  Transportation Needs: Not on file  Physical Activity: Not on file  Stress: Not on file  Social Connections: Not on file  Intimate Partner Violence: Unknown (11/23/2024)   [facility]    Fear of Current or Ex-Partner: Not on file    Emotionally Abused: Not on file    Within the last year, have you been kicked, hit, slapped, or otherwise physically hurt by your partner or ex-partner?: No    Sexually Abused: Not on file  Depression (EYV7-0): Not on  file  Alcohol Screen: Not on file  Housing: Not on file  Utilities: Not on file  Health Literacy: Not on file   FH:  Family History  Adopted: Yes   Past Medical History:  Diagnosis Date   Alcohol abuse    Asthma    Chronic systolic CHF (congestive heart failure) (HCC)    Coronary artery disease    a. OOH/late presenting MI 2019 s/p DES to prox/mid LAD.   Ischemic cardiomyopathy    Mild sleep apnea    Myocardial infarction (HCC)    Tobacco abuse    Current Outpatient Medications  Medication Sig Dispense Refill   albuterol  (PROVENTIL  HFA;VENTOLIN  HFA) 108 (90 Base) MCG/ACT inhaler Inhale 1-2 puffs into the lungs every 6 (six) hours as needed for wheezing or shortness of breath.     aspirin  EC 81 MG EC tablet Take 1 tablet (81 mg total) by  mouth daily. 30 tablet 6   atorvastatin  (LIPITOR ) 80 MG tablet Take 1 tablet (80 mg total) by mouth daily. 30 tablet 11   carvedilol  (COREG ) 6.25 MG tablet Take 1.5 tablets (9.375 mg total) by mouth 2 (two) times daily. 60 tablet 11   dapagliflozin  propanediol (FARXIGA ) 10 MG TABS tablet Take 1 tablet by mouth daily before breakfast. 30 tablet 11   Evolocumab  (REPATHA  SURECLICK) 140 MG/ML SOAJ Inject 140 mg into the skin every 14 (fourteen) days. (Patient not taking: Reported on 05/04/2024) 6 mL 3   furosemide  (LASIX ) 40 MG tablet Take 1 tablet (40 mg total) by mouth as needed. 30 tablet 11   loratadine  (CLARITIN ) 10 MG tablet Take 10 mg by mouth daily as needed for allergies.     Multiple Vitamin (MULTIVITAMIN) tablet Take 1 tablet by mouth daily.     nitroGLYCERIN  (NITROSTAT ) 0.4 MG SL tablet Place 1 tablet (0.4 mg total) under the tongue every 5 (five) minutes as needed for chest pain. 60 tablet 0   potassium chloride  SA (KLOR-CON  M) 20 MEQ tablet Take 2 tablets (40 mEq total) by mouth daily as needed (when taking furosemide ). 30 tablet 11   sacubitril -valsartan  (ENTRESTO ) 49-51 MG Take 1 tablet by mouth 2 (two) times daily. 60 tablet 11   semaglutide -weight management (WEGOVY ) 2.4 MG/0.75ML SOAJ SQ injection Inject 2.4 mg into the skin once a week. 3 mL 11   spironolactone  (ALDACTONE ) 25 MG tablet Take 1 tablet (25 mg total) by mouth daily. 30 tablet 11   No current facility-administered medications for this visit.   There were no vitals taken for this visit.  Wt Readings from Last 3 Encounters:  05/04/24 117.6 kg (259 lb 3.2 oz)  06/25/23 (!) 139.6 kg (307 lb 12.8 oz)  11/28/22 (!) 146 kg (321 lb 12.8 oz)    PHYSICAL EXAM: General:  Well appearing. No resp difficulty HEENT: normal Neck: supple. no JVD. Carotids 2+ bilat; no bruits. No lymphadenopathy or thryomegaly appreciated. Cor: PMI nondisplaced. Regular rate & rhythm. No rubs, gallops or murmurs. Lungs: clear Abdomen: soft,  nontender, nondistended. No hepatosplenomegaly. No bruits or masses. Good bowel sounds. Extremities: no cyanosis, clubbing, rash, edema Neuro: alert & orientedx3, cranial nerves grossly intact. moves all 4 extremities w/o difficulty. Affect pleasant   Device interrogation (personally reviewed from 03/3024): no AF/VT Personally reviewed  ECG (personally reviewed): Sinus brady 55 anterior Qs Personally reviewed   ASSESSMENT & PLAN: 1. Chronic Systolic Heart Failure due to ICM - Echo (1/19): EF 30-35%.  - Echo (4/19): EF 30-35%.  - Echo (8/19): EF 25-30%  -  Echo (1/21): EF 35-40% RV ok. (I thought 30-35%) - S/p SQ [employer] ICD 12/18/19. - Echo (6/22): EF 30-35% - Echo (6/24): EF 30-35%, RV ok - Much improved NYHA I  - Volume status ok on PRN lasix  - Continue Entresto  49/51 mg bid. Discussed fact that BP may drop further as he loses weight. May need to cut back to 24/26 bid  - Continue carvedilol   9.375 mg bid. - Continue spironolactone  25 mg daily. - Continue Farxiga  10 mg daily. - Can consider Barostim as symptoms dictate - Due for repeat labs and echo. Will order   2. CAD - Cath 1/20 with total occlusion of LAD with R to L collaterals.  - s/p DES to proximal/mid LAD (1/20). - No s/s angina - Continue high dose statin, repatha , aspirin , and plavix .   3. Obesity - There is no height or weight on file to calculate BMI. - Has lost 50 pounds with Wegovy  and diet/exercise  4. OSA - Continue CPAP - Follows with Dr. Shlomo Harlene CHRISTELLA Glena, FNP 9:46 AM    "

## 2025-02-04 ENCOUNTER — Encounter (HOSPITAL_COMMUNITY)

## 2025-02-21 ENCOUNTER — Ambulatory Visit (HOSPITAL_COMMUNITY)

## 2025-04-26 ENCOUNTER — Encounter

## 2025-07-26 ENCOUNTER — Encounter

## 2025-10-25 ENCOUNTER — Encounter
